# Patient Record
Sex: Female | Born: 1945
Health system: Southern US, Community
[De-identification: ages and names within clinical notes are randomized; demographics above are authoritative.]

## PROBLEM LIST (undated history)

## (undated) DIAGNOSIS — E041 Nontoxic single thyroid nodule: Secondary | ICD-10-CM

## (undated) DIAGNOSIS — F419 Anxiety disorder, unspecified: Secondary | ICD-10-CM

## (undated) DIAGNOSIS — M1990 Unspecified osteoarthritis, unspecified site: Secondary | ICD-10-CM

## (undated) DIAGNOSIS — I10 Essential (primary) hypertension: Secondary | ICD-10-CM

## (undated) DIAGNOSIS — R112 Nausea with vomiting, unspecified: Secondary | ICD-10-CM

## (undated) DIAGNOSIS — F32A Depression, unspecified: Secondary | ICD-10-CM

## (undated) DIAGNOSIS — J189 Pneumonia, unspecified organism: Secondary | ICD-10-CM

## (undated) DIAGNOSIS — Z87442 Personal history of urinary calculi: Secondary | ICD-10-CM

## (undated) DIAGNOSIS — R32 Unspecified urinary incontinence: Secondary | ICD-10-CM

## (undated) DIAGNOSIS — Z9889 Other specified postprocedural states: Secondary | ICD-10-CM

## (undated) DIAGNOSIS — R058 Other specified cough: Secondary | ICD-10-CM

## (undated) DIAGNOSIS — IMO0002 Reserved for concepts with insufficient information to code with codable children: Secondary | ICD-10-CM

## (undated) DIAGNOSIS — D649 Anemia, unspecified: Secondary | ICD-10-CM

## (undated) DIAGNOSIS — E039 Hypothyroidism, unspecified: Secondary | ICD-10-CM

## (undated) DIAGNOSIS — D45 Polycythemia vera: Secondary | ICD-10-CM

## (undated) DIAGNOSIS — Z8781 Personal history of (healed) traumatic fracture: Secondary | ICD-10-CM

## (undated) DIAGNOSIS — R011 Cardiac murmur, unspecified: Secondary | ICD-10-CM

## (undated) DIAGNOSIS — S83512A Sprain of anterior cruciate ligament of left knee, initial encounter: Secondary | ICD-10-CM

## (undated) DIAGNOSIS — N809 Endometriosis, unspecified: Secondary | ICD-10-CM

## (undated) DIAGNOSIS — K219 Gastro-esophageal reflux disease without esophagitis: Secondary | ICD-10-CM

## (undated) DIAGNOSIS — T464X5A Adverse effect of angiotensin-converting-enzyme inhibitors, initial encounter: Secondary | ICD-10-CM

## (undated) DIAGNOSIS — M8580 Other specified disorders of bone density and structure, unspecified site: Secondary | ICD-10-CM

## (undated) HISTORY — DX: Other specified cough: R05.8

## (undated) HISTORY — DX: Hypothyroidism, unspecified: E03.9

## (undated) HISTORY — DX: Endometriosis, unspecified: N80.9

## (undated) HISTORY — DX: Other specified disorders of bone density and structure, unspecified site: M85.80

## (undated) HISTORY — DX: Personal history of (healed) traumatic fracture: Z87.81

## (undated) HISTORY — DX: Sprain of anterior cruciate ligament of left knee, initial encounter: S83.512A

## (undated) HISTORY — DX: Reserved for concepts with insufficient information to code with codable children: IMO0002

## (undated) HISTORY — DX: Polycythemia vera: D45

## (undated) HISTORY — PX: TONSILLECTOMY: SUR1361

## (undated) HISTORY — DX: Unspecified urinary incontinence: R32

## (undated) HISTORY — DX: Adverse effect of angiotensin-converting-enzyme inhibitors, initial encounter: T46.4X5A

## (undated) HISTORY — DX: Cardiac murmur, unspecified: R01.1

## (undated) HISTORY — DX: Nontoxic single thyroid nodule: E04.1

---

## 1975-07-31 HISTORY — PX: AUGMENTATION MAMMAPLASTY: SUR837

## 1980-07-30 DIAGNOSIS — N809 Endometriosis, unspecified: Secondary | ICD-10-CM

## 1980-07-30 HISTORY — DX: Endometriosis, unspecified: N80.9

## 1980-07-30 HISTORY — PX: OTHER SURGICAL HISTORY: SHX169

## 1980-07-30 HISTORY — PX: ABDOMINAL HYSTERECTOMY: SHX81

## 1999-11-08 ENCOUNTER — Encounter: Admission: RE | Admit: 1999-11-08 | Discharge: 1999-11-08 | Payer: Self-pay | Admitting: Obstetrics and Gynecology

## 1999-11-08 ENCOUNTER — Encounter: Payer: Self-pay | Admitting: Obstetrics and Gynecology

## 2001-01-09 ENCOUNTER — Encounter: Admission: RE | Admit: 2001-01-09 | Discharge: 2001-01-09 | Payer: Self-pay | Admitting: Obstetrics and Gynecology

## 2001-01-09 ENCOUNTER — Encounter: Payer: Self-pay | Admitting: Obstetrics and Gynecology

## 2002-02-02 ENCOUNTER — Other Ambulatory Visit: Admission: RE | Admit: 2002-02-02 | Discharge: 2002-02-02 | Payer: Self-pay | Admitting: Obstetrics and Gynecology

## 2002-10-27 ENCOUNTER — Ambulatory Visit (HOSPITAL_COMMUNITY): Admission: RE | Admit: 2002-10-27 | Discharge: 2002-10-27 | Payer: Self-pay | Admitting: *Deleted

## 2002-10-27 ENCOUNTER — Encounter (INDEPENDENT_AMBULATORY_CARE_PROVIDER_SITE_OTHER): Payer: Self-pay | Admitting: *Deleted

## 2002-12-16 ENCOUNTER — Encounter: Admission: RE | Admit: 2002-12-16 | Discharge: 2002-12-16 | Payer: Self-pay | Admitting: Obstetrics and Gynecology

## 2002-12-16 ENCOUNTER — Encounter: Payer: Self-pay | Admitting: Obstetrics and Gynecology

## 2003-04-27 ENCOUNTER — Other Ambulatory Visit: Admission: RE | Admit: 2003-04-27 | Discharge: 2003-04-27 | Payer: Self-pay | Admitting: Obstetrics and Gynecology

## 2003-06-08 ENCOUNTER — Encounter: Admission: RE | Admit: 2003-06-08 | Discharge: 2003-06-08 | Payer: Self-pay | Admitting: Obstetrics and Gynecology

## 2004-12-04 ENCOUNTER — Ambulatory Visit (HOSPITAL_COMMUNITY): Admission: RE | Admit: 2004-12-04 | Discharge: 2004-12-04 | Payer: Self-pay | Admitting: Otolaryngology

## 2006-11-27 ENCOUNTER — Encounter: Admission: RE | Admit: 2006-11-27 | Discharge: 2006-11-27 | Payer: Self-pay | Admitting: Obstetrics & Gynecology

## 2006-12-27 ENCOUNTER — Other Ambulatory Visit: Admission: RE | Admit: 2006-12-27 | Discharge: 2006-12-27 | Payer: Self-pay | Admitting: Obstetrics & Gynecology

## 2007-07-31 DIAGNOSIS — M8580 Other specified disorders of bone density and structure, unspecified site: Secondary | ICD-10-CM

## 2007-07-31 HISTORY — DX: Other specified disorders of bone density and structure, unspecified site: M85.80

## 2008-01-29 ENCOUNTER — Other Ambulatory Visit: Admission: RE | Admit: 2008-01-29 | Discharge: 2008-01-29 | Payer: Self-pay | Admitting: Obstetrics & Gynecology

## 2008-03-11 ENCOUNTER — Encounter: Admission: RE | Admit: 2008-03-11 | Discharge: 2008-03-11 | Payer: Self-pay | Admitting: Family Medicine

## 2010-08-20 ENCOUNTER — Encounter: Payer: Self-pay | Admitting: Otolaryngology

## 2010-08-31 ENCOUNTER — Other Ambulatory Visit: Payer: Self-pay | Admitting: Obstetrics & Gynecology

## 2010-08-31 DIAGNOSIS — Z1239 Encounter for other screening for malignant neoplasm of breast: Secondary | ICD-10-CM

## 2010-09-14 ENCOUNTER — Ambulatory Visit
Admission: RE | Admit: 2010-09-14 | Discharge: 2010-09-14 | Disposition: A | Discharge status: Home / Self Care | Payer: BC Managed Care – PPO | Source: Ambulatory Visit | Attending: Obstetrics & Gynecology | Admitting: Obstetrics & Gynecology

## 2010-09-14 DIAGNOSIS — Z1239 Encounter for other screening for malignant neoplasm of breast: Secondary | ICD-10-CM

## 2010-12-15 NOTE — Op Note (Signed)
   NAME:  Heather Brown, Heather Brown                        ACCOUNT NO.:  [id number]   MEDICAL RECORD NO.:  [id number]                   PATIENT TYPE:  AMB   LOCATION:  ENDO                                 FACILITY:  [facility]   PHYSICIAN:  Georgiana Spinner, M.D.                 DATE OF BIRTH:  11/02/1945   DATE OF PROCEDURE:  DATE OF DISCHARGE:                                 OPERATIVE REPORT   PROCEDURE:  Colonoscopy.   INDICATIONS FOR PROCEDURE:  Hemoccult positivity.   ANESTHESIA:  Demerol 20 mg, Versed 3 mg .   PROCEDURE:  With the patient in mildly sedated in the left lateral decubitus  position, the Olympus videoscopic colonoscope was inserted in the rectum and  passed under direct vision to the cecum which was identified by the  ileocecal valve and appendiceal orifice, both of which were photographed.  From this point, the colonoscope was slowly withdrawn, taking  circumferential views of the entire colonic mucosa, stopping only in the  rectum which appeared normal and showed internal hemorrhoids on retroflex  view.  The endoscope was straightened and withdrawn.  The patient's vital  signs remained stable.  The patient tolerated the procedure well without  apparent complications.   FINDINGS:  Internal hemorrhoids, otherwise unremarkable endoscopic  examination to the cecum.   PLAN:  See endoscopy note for further details.  Will have the patient follow  up with me as an outpatient.                                               Georgiana Spinner, M.D.    GMO/MEDQ  D:  10/27/2002  T:  10/27/2002  Job:  [id number]   cc:   Al Decant. Janey Greaser, M.D.  [address]  [place]  [place] [address]  Fax: [phone]   Laqueta Linden, M.D.  [address]  [place]  [place] [address]  Fax: [phone]

## 2010-12-15 NOTE — Op Note (Signed)
   NAME:  Heather Brown, Heather Brown                        ACCOUNT NO.:  [id number]   MEDICAL RECORD NO.:  [id number]                   PATIENT TYPE:  AMB   LOCATION:  ENDO                                 FACILITY:  [facility]   PHYSICIAN:  Georgiana Spinner, M.D.                 DATE OF BIRTH:  05-21-1946   DATE OF PROCEDURE:  10/27/2002  DATE OF DISCHARGE:                                 OPERATIVE REPORT   PROCEDURE PERFORMED:  Upper endoscopy.   ENDOSCOPIST:  Georgiana Spinner, M.D.   INDICATIONS FOR PROCEDURE:  Hemoccult positivity.   ANESTHESIA:  Demerol 20 mg, Versed 3 mg, Phenergan 25 mg was given prior to  the procedure.   DESCRIPTION OF PROCEDURE:  With the patient mildly sedated in the left  lateral decubitus position, the Olympus video endoscope was inserted in the  mouth and passed under direct vision through the esophagus which appeared  normal.  There was a question of one small lick of Barrett's esophagus  tissue that was photographed and biopsied.  We entered into the stomach.  The fundus, body, antrum, duodenal bulb and second portion of the duodenum  all appeared normal.  From this point, the endoscope was slowly withdrawn  taking circumferential views of the entire duodenal mucosa until the  endoscope was pulled back into the stomach and placed on retroflexion to  view the stomach from below.  The endoscope was then straightened and  withdrawn taking circumferential views of the remaining gastric and  esophageal mucosa.  The patient's vital signs and pulse oximeter remained  stable.  The patient tolerated the procedure well without apparent  complications.    FINDINGS:  Question of Barrett's esophagus, biopsied.  Await biopsy report.  Patient will call me for results and follow up with me as an outpatient.  Proceed to colonoscopy as planned.   PLAN:                                               Georgiana Spinner, M.D.    GMO/MEDQ  D:  10/27/2002  T:  10/27/2002  Job:   [id number]   cc:   Al Decant. Janey Greaser, M.D.  [address]  [place]  [place] [address]  Fax: [phone]   Laqueta Linden, M.D.  [address]  [place]  [place] [address]  Fax: [phone]

## 2011-10-11 ENCOUNTER — Other Ambulatory Visit: Payer: Self-pay | Admitting: Obstetrics & Gynecology

## 2011-10-11 DIAGNOSIS — Z1231 Encounter for screening mammogram for malignant neoplasm of breast: Secondary | ICD-10-CM

## 2011-11-12 ENCOUNTER — Ambulatory Visit
Admission: RE | Admit: 2011-11-12 | Discharge: 2011-11-12 | Disposition: A | Discharge status: Home / Self Care | Payer: BC Managed Care – PPO | Source: Ambulatory Visit | Attending: Obstetrics & Gynecology | Admitting: Obstetrics & Gynecology

## 2011-11-12 DIAGNOSIS — Z1231 Encounter for screening mammogram for malignant neoplasm of breast: Secondary | ICD-10-CM

## 2011-11-12 LAB — HM MAMMOGRAPHY

## 2011-11-28 HISTORY — PX: COSMETIC SURGERY: SHX468

## 2012-01-07 LAB — HM PAP SMEAR

## 2013-02-12 ENCOUNTER — Ambulatory Visit: Payer: Self-pay | Admitting: Obstetrics and Gynecology

## 2013-02-12 ENCOUNTER — Ambulatory Visit: Payer: Self-pay | Admitting: Obstetrics & Gynecology

## 2013-02-13 ENCOUNTER — Encounter (INDEPENDENT_AMBULATORY_CARE_PROVIDER_SITE_OTHER): Payer: Medicare Other | Admitting: Vascular Surgery

## 2013-02-13 DIAGNOSIS — I70229 Atherosclerosis of native arteries of extremities with rest pain, unspecified extremity: Secondary | ICD-10-CM

## 2013-03-11 ENCOUNTER — Ambulatory Visit (INDEPENDENT_AMBULATORY_CARE_PROVIDER_SITE_OTHER): Payer: MEDICARE | Admitting: Obstetrics and Gynecology

## 2013-03-11 ENCOUNTER — Encounter: Payer: Self-pay | Admitting: Obstetrics and Gynecology

## 2013-03-11 VITALS — BP 110/78 | HR 70 | Ht 62.0 in | Wt 166.5 lb

## 2013-03-11 DIAGNOSIS — M8580 Other specified disorders of bone density and structure, unspecified site: Secondary | ICD-10-CM

## 2013-03-11 DIAGNOSIS — Z0000 Encounter for general adult medical examination without abnormal findings: Secondary | ICD-10-CM

## 2013-03-11 DIAGNOSIS — M949 Disorder of cartilage, unspecified: Secondary | ICD-10-CM

## 2013-03-11 DIAGNOSIS — R5381 Other malaise: Secondary | ICD-10-CM

## 2013-03-11 DIAGNOSIS — Z23 Encounter for immunization: Secondary | ICD-10-CM

## 2013-03-11 DIAGNOSIS — N952 Postmenopausal atrophic vaginitis: Secondary | ICD-10-CM

## 2013-03-11 DIAGNOSIS — Z1231 Encounter for screening mammogram for malignant neoplasm of breast: Secondary | ICD-10-CM

## 2013-03-11 DIAGNOSIS — R5383 Other fatigue: Secondary | ICD-10-CM

## 2013-03-11 DIAGNOSIS — M899 Disorder of bone, unspecified: Secondary | ICD-10-CM

## 2013-03-11 DIAGNOSIS — Z01419 Encounter for gynecological examination (general) (routine) without abnormal findings: Secondary | ICD-10-CM

## 2013-03-11 LAB — COMPREHENSIVE METABOLIC PANEL
AST: 25 U/L (ref 0–37)
Albumin: 4.6 g/dL (ref 3.5–5.2)
Alkaline Phosphatase: 115 U/L (ref 39–117)
Calcium: 10.3 mg/dL (ref 8.4–10.5)
Chloride: 105 mEq/L (ref 96–112)
Creat: 0.74 mg/dL (ref 0.50–1.10)
Sodium: 140 mEq/L (ref 135–145)
Total Bilirubin: 1 mg/dL (ref 0.3–1.2)

## 2013-03-11 LAB — THYROID PANEL WITH TSH
Free Thyroxine Index: 3.2 (ref 1.0–3.9)
T3 Uptake: 33.8 % (ref 22.5–37.0)
T4, Total: 9.6 ug/dL (ref 5.0–12.5)

## 2013-03-11 LAB — CBC
HCT: 46 % (ref 36.0–46.0)
MCHC: 33.9 g/dL (ref 30.0–36.0)
Platelets: 265 10*3/uL (ref 150–400)
RBC: 5.42 MIL/uL — ABNORMAL HIGH (ref 3.87–5.11)

## 2013-03-11 LAB — POCT URINALYSIS DIPSTICK
Bilirubin, UA: NEGATIVE
Blood, UA: NEGATIVE
Ketones, UA: NEGATIVE
Leukocytes, UA: NEGATIVE

## 2013-03-11 LAB — LIPID PANEL
Cholesterol: 182 mg/dL (ref 0–200)
LDL Cholesterol: 115 mg/dL — ABNORMAL HIGH (ref 0–99)
Total CHOL/HDL Ratio: 3.6 Ratio

## 2013-03-11 NOTE — Progress Notes (Signed)
Patient ID: Heather Brown, female   DOB: 1945-10-18, 67 y.o.   MRN: [id number] 67 y.o.   Married    Caucasian   female   G2P2002   here for annual exam.    Patient having problems with peripheral neuropathy.   Has had steroid injections Had bilateral leg biopsies yesterday.  Had a HgbA1C 5.6%.  Vitamin B12 - 519. - both at [facility].  Told her level of B12 was low and that she needed to do supplementation, which is expensive.  Patient questioning this recommendation and asking for a second opinion.   Patient would like to have general blood work Concerned about thyroid disease, which runs in the family.   Has a history of vitamin D deficiency.  External vulvar irritation.  No discharge. Dryness with intercourse. [place] Jelly is not sufficient.   Some urinary incontinence but no pad use.    No LMP recorded. Patient has had a hysterectomy.  Had abnormal paps and endometriosis.   Still has ovaries.  Sexually active: yes  The current method of family planning is status post hysterectomy.    Exercising: no Last mammogram:  10/2011 wnl:[facility] Last pap smear: 01-07-12 wnl History of abnormal pap: 30 years ago and this is why had hysterectomy Smoking: no Alcohol: no Last colonoscopy: 2004 wnl Last Bone Density:   2009:osteopenia.  T Score of lumbar spine: - 1.5, T Score of left femur:  - 1.8.   Last cholesterol check: 2013 wnl with PCP Dr. Juluis Rainier  Hgb:                Urine: Neg   Family History  Problem Relation Age of Onset  . Cancer Mother   . Cervical cancer Mother   . Diabetes Father     adult onset  . Stroke Father   . Thyroid disease Sister     hyperthyroid  . Asthma Sister   . Cancer Maternal Grandmother   . Stomach cancer Maternal Grandmother   . Thyroid disease Sister     hypothyroid    There are no active problems to display for this patient.   Past Medical History  Diagnosis Date  . Osteopenia 2009  . Endometriosis 1982    found  at [facility]  . Dyspareunia     dryness with intercourse  . Heart murmur   . Urinary incontinence     with coughing/sneezing    Past Surgical History  Procedure Laterality Date  . Dilatation and curettage  1982  . Abdominal hysterectomy  1982    [facility]--for abnormal paps/endometriosis  . Augmentation mammaplasty  1977  . Cosmetic surgery  11/2011    face lift,neck lift, eye lift--Dr. Benna Dunks    Allergies: Sulfa antibiotics  Current Outpatient Prescriptions  Medication Sig Dispense Refill  . aspirin 81 MG tablet Take 81 mg by mouth daily.      . fish oil-omega-3 fatty acids 1000 MG capsule Take 2 g by mouth daily.      . Multiple Vitamin (MULTIVITAMIN) capsule Take 1 capsule by mouth daily.      . calcium carbonate (OS-CAL) 600 MG TABS tablet Take 600 mg by mouth 2 (two) times daily with a meal.      . Vitamin D, Ergocalciferol, (DRISDOL) 50000 UNITS CAPS capsule Take 50,000 Units by mouth every 14 (fourteen) days.       No current facility-administered medications for this visit.    ROS: Pertinent items are noted in  HPI.  Social Hx:  Married.   Exam:    BP 110/78  Pulse 70  Ht 5\' 2"  (1.575 m)  Wt 166 lb 8 oz (75.524 kg)  BMI 30.45 kg/m2   Wt Readings from Last 3 Encounters:  03/11/13 166 lb 8 oz (75.524 kg)     Ht Readings from Last 3 Encounters:  03/11/13 5\' 2"  (1.575 m)    General appearance: alert, cooperative and appears stated age Head: Normocephalic, without obvious abnormality, atraumatic Neck: no adenopathy, supple, symmetrical, trachea midline and thyroid not enlarged, symmetric, no tenderness/mass/nodules Lungs: clear to auscultation bilaterally Breasts: Inspection negative, No nipple retraction or dimpling, No nipple discharge or bleeding, No axillary or supraclavicular adenopathy, Normal to palpation without dominant masses Heart: regular rate and rhythm Abdomen: soft, non-tender; bowel sounds normal; no masses,  no organomegaly Extremities: extremities  normal, atraumatic, no cyanosis or edema Skin: Skin color, texture, turgor normal. No rashes or lesions Lymph nodes: Cervical, supraclavicular, and axillary nodes normal. No abnormal inguinal nodes palpated Neurologic: Grossly normal   Pelvic: External genitalia:  no lesions              Urethra:  normal appearing urethra with no masses, tenderness or lesions              Bartholins and Skenes: normal                 Vagina: normal appearing vagina with normal color and discharge, no lesions              Cervix:  Absent.              Pap taken: yes and high risk HPV testing.         Bimanual Exam:  Uterus:   absent                                      Adnexa: normal adnexa in size, nontender and no masses                                      Rectovaginal: Confirms                                      Anus:  normal sphincter tone, no lesions  Assessment  Atrophic vaginitis.  Overdue for mammogram. History of cervical dysplasia.  Status post hysterectomy over 20 years ago. Ovaries remain.  Osteopenia.  Fatigue/malaise. Suspected peripheral neuropathy.  Under evaluation.  Overdue for tetanus vaccine.   P:     M[facility]ogram at [facility].  Order placed. Patient will call. Bone density at [facility].  Order placed. Patient will call.  pap smear and HPV testing.  Labs - lipid profile, CBC, TFTs, CMP, Vitamin D, Vitamin B12 level. TDap. I did discuss possible estrogen therapy for atrophy symptoms.  Patient will need to get her mammogram done first.  I suggested the patient wait for her biopsy results for the peripheral neuropathy work up and then discuss the treatment options available. On her lab review, her Vitamin B 12 was within a normal range, but I indicated that her physician may have research showing what an appropriate level should be given her diagnosis.  Return annually or prn.  An After Visit Summary was printed and given to the patient.

## 2013-03-11 NOTE — Patient Instructions (Addendum)
Cyanocobalamin, Vitamin B12 injection What is this medicine? CYANOCOBALAMIN (sye an oh koe BAL a min) is a man made form of vitamin B12. Vitamin B12 is used in the growth of healthy blood cells, nerve cells, and proteins in the body. It also helps with the metabolism of fats and carbohydrates. This medicine is used to treat people who can not absorb vitamin B12. This medicine may be used for other purposes; ask your health care provider or pharmacist if you have questions. What should I tell my health care provider before I take this medicine? They need to know if you have any of these conditions: -kidney disease -Leber's disease -megaloblastic anemia -an unusual or allergic reaction to cyanocobalamin, cobalt, other medicines, foods, dyes, or preservatives -pregnant or trying to get pregnant -breast-feeding How should I use this medicine? This medicine is injected into a muscle or deeply under the skin. It is usually given by a health care professional in a clinic or doctor's office. However, your doctor may teach you how to inject yourself. Follow all instructions. Talk to your pediatrician regarding the use of this medicine in children. Special care may be needed. Overdosage: If you think you have taken too much of this medicine contact a poison control center or emergency room at once. NOTE: This medicine is only for you. Do not share this medicine with others. What if I miss a dose? If you are given your dose at a clinic or doctor's office, call to reschedule your appointment. If you give your own injections and you miss a dose, take it as soon as you can. If it is almost time for your next dose, take only that dose. Do not take double or extra doses. What may interact with this medicine? -colchicine -heavy alcohol intake This list may not describe all possible interactions. Give your health care provider a list of all the medicines, herbs, non-prescription drugs, or dietary supplements you  use. Also tell them if you smoke, drink alcohol, or use illegal drugs. Some items may interact with your medicine. What should I watch for while using this medicine? Visit your doctor or health care professional regularly. You may need blood work done while you are taking this medicine. You may need to follow a special diet. Talk to your doctor. Limit your alcohol intake and avoid smoking to get the best benefit. What side effects may I notice from receiving this medicine? Side effects that you should report to your doctor or health care professional as soon as possible: -allergic reactions like skin rash, itching or hives, swelling of the face, lips, or tongue -blue tint to skin -chest tightness, pain -difficulty breathing, wheezing -dizziness -red, swollen painful area on the leg Side effects that usually do not require medical attention (report to your doctor or health care professional if they continue or are bothersome): -diarrhea -headache This list may not describe all possible side effects. Call your doctor for medical advice about side effects. You may report side effects to FDA at 1-800-FDA-1088. Where should I keep my medicine? Keep out of the reach of children. Store at room temperature between 15 and 30 degrees C (59 and 85 degrees F). Protect from light. Throw away any unused medicine after the expiration date. NOTE: This sheet is a summary. It may not cover all possible information. If you have questions about this medicine, talk to your doctor, pharmacist, or health care provider.  2013, Elsevier/Gold Standard. (10/27/2007 10:10:20 PM)  EXERCISE AND DIET:  We recommended  that you start or continue a regular exercise program for good health. Regular exercise means any activity that makes your heart beat faster and makes you sweat.  We recommend exercising at least 30 minutes per day at least 3 days a week, preferably 4 or 5.  We also recommend a diet low in fat and sugar.   Inactivity, poor dietary choices and obesity can cause diabetes, heart attack, stroke, and kidney damage, among others.    ALCOHOL AND SMOKING:  Women should limit their alcohol intake to no more than 7 drinks/beers/glasses of wine (combined, not each!) per week. Moderation of alcohol intake to this level decreases your risk of breast cancer and liver damage. And of course, no recreational drugs are part of a healthy lifestyle.  And absolutely no smoking or even second hand smoke. Most people know smoking can cause heart and lung diseases, but did you know it also contributes to weakening of your bones? Aging of your skin?  Yellowing of your teeth and nails?  CALCIUM AND VITAMIN D:  Adequate intake of calcium and Vitamin D are recommended.  The recommendations for exact amounts of these supplements seem to change often, but generally speaking 600 mg of calcium (either carbonate or citrate) and 800 units of Vitamin D per day seems prudent. Certain women may benefit from higher intake of Vitamin D.  If you are among these women, your doctor will have told you during your visit.    PAP SMEARS:  Pap smears, to check for cervical cancer or precancers,  have traditionally been done yearly, although recent scientific advances have shown that most women can have pap smears less often.  However, every woman still should have a physical exam from her gynecologist every year. It will include a breast check, inspection of the vulva and vagina to check for abnormal growths or skin changes, a visual exam of the cervix, and then an exam to evaluate the size and shape of the uterus and ovaries.  And after 68 years of age, a rectal exam is indicated to check for rectal cancers. We will also provide age appropriate advice regarding health maintenance, like when you should have certain vaccines, screening for sexually transmitted diseases, bone density testing, colonoscopy, mammograms, etc.   MAMMOGRAMS:  All women over 37  years old should have a yearly mammogram. Many facilities now offer a "3D" mammogram, which may cost around $50 extra out of pocket. If possible,  we recommend you accept the option to have the 3D mammogram performed.  It both reduces the number of women who will be called back for extra views which then turn out to be normal, and it is better than the routine mammogram at detecting truly abnormal areas.    COLONOSCOPY:  Colonoscopy to screen for colon cancer is recommended for all women at age 93.  We know, you hate the idea of the prep.  We agree, BUT, having colon cancer and not knowing it is worse!!  Colon cancer so often starts as a polyp that can be seen and removed at colonscopy, which can quite literally save your life!  And if your first colonoscopy is normal and you have no family history of colon cancer, most women don't have to have it again for 10 years.  Once every ten years, you can do something that may end up saving your life, right?  We will be happy to help you get it scheduled when you are ready.  Be sure to check  your insurance coverage so you understand how much it will cost.  It may be covered as a preventative service at no cost, but you should check your particular policy.    Tetanus, Diphtheria, Pertussis (Tdap) Vaccine What You Need to Know WHY GET VACCINATED? Tetanus, diphtheria and pertussis can be very serious diseases, even for adolescents and adults. Tdap vaccine can protect [place] from these diseases. TETANUS (Lockjaw) causes painful muscle tightening and stiffness, usually all over the body.  It can lead to tightening of muscles in the head and neck so you can't open your mouth, swallow, or sometimes even breathe. Tetanus kills about 1 out of 5 people who are infected. DIPHTHERIA can cause a thick coating to form in the back of the throat.  It can lead to breathing problems, paralysis, heart failure, and death. PERTUSSIS (Whooping Cough) causes severe coughing spells,  which can cause difficulty breathing, vomiting and disturbed sleep.  It can also lead to weight loss, incontinence, and rib fractures. Up to 2 in 100 adolescents and 5 in 100 adults with pertussis are hospitalized or have complications, which could include pneumonia and death. These diseases are caused by bacteria. Diphtheria and pertussis are spread from person to person through coughing or sneezing. Tetanus enters the body through cuts, scratches, or wounds. Before vaccines, the [place] saw as many as 200,000 cases a year of diphtheria and pertussis, and hundreds of cases of tetanus. Since vaccination began, tetanus and diphtheria have dropped by about 99% and pertussis by about 80%. TDAP VACCINE Tdap vaccine can protect adolescents and adults from tetanus, diphtheria, and pertussis. One dose of Tdap is routinely given at age 72 or 39. People who did not get Tdap at that age should get it as soon as possible. Tdap is especially important for health care professionals and anyone having close contact with a baby younger than 12 months. Pregnant women should get a dose of Tdap during every pregnancy, to protect the newborn from pertussis. Infants are most at risk for severe, life-threatening complications from pertussis. A similar vaccine, called Td, protects from tetanus and diphtheria, but not pertussis. A Td booster should be given every 10 years. Tdap may be given as one of these boosters if you have not already gotten a dose. Tdap may also be given after a severe cut or burn to prevent tetanus infection. Your doctor can give you more information. Tdap may safely be given at the same time as other vaccines. SOME PEOPLE SHOULD NOT GET THIS VACCINE  If you ever had a life-threatening allergic reaction after a dose of any tetanus, diphtheria, or pertussis containing vaccine, OR if you have a severe allergy to any part of this vaccine, you should not get Tdap. Tell your doctor if you have any  severe allergies.  If you had a coma, or long or multiple seizures within 7 days after a childhood dose of DTP or DTaP, you should not get Tdap, unless a cause other than the vaccine was found. You can still get Td.  Talk to your doctor if you:  have epilepsy or another nervous system problem,  had severe pain or swelling after any vaccine containing diphtheria, tetanus or pertussis,  ever had Guillain-Barr Syndrome (GBS),  aren't feeling well on the day the shot is scheduled. RISKS OF A VACCINE REACTION With any medicine, including vaccines, there is a chance of side effects. These are usually mild and go away on their own, but serious reactions are also possible.  Brief fainting spells can follow a vaccination, leading to injuries from falling. Sitting or lying down for about 15 minutes can help prevent these. Tell your doctor if you feel dizzy or light-headed, or have vision changes or ringing in the ears. Mild problems following Tdap (Did not interfere with activities)  Pain where the shot was given (about 3 in 4 adolescents or 2 in 3 adults)  Redness or swelling where the shot was given (about 1 person in 5)  Mild fever of at least 100.20F (up to about 1 in 25 adolescents or 1 in 100 adults)  Headache (about 3 or 4 people in 10)  Tiredness (about 1 person in 3 or 4)  Nausea, vomiting, diarrhea, stomach ache (up to 1 in 4 adolescents or 1 in 10 adults)  Chills, body aches, sore joints, rash, swollen glands (uncommon) Moderate problems following Tdap (Interfered with activities, but did not require medical attention)  Pain where the shot was given (about 1 in 5 adolescents or 1 in 100 adults)  Redness or swelling where the shot was given (up to about 1 in 16 adolescents or 1 in 25 adults)  Fever over 102F (about 1 in 100 adolescents or 1 in 250 adults)  Headache (about 3 in 20 adolescents or 1 in 10 adults)  Nausea, vomiting, diarrhea, stomach ache (up to 1 or 3 people in  100)  Swelling of the entire arm where the shot was given (up to about 3 in 100). Severe problems following Tdap (Unable to perform usual activities, required medical attention)  Swelling, severe pain, bleeding and redness in the arm where the shot was given (rare). A severe allergic reaction could occur after any vaccine (estimated less than 1 in a million doses). WHAT IF THERE IS A SERIOUS REACTION? What should I look for?  Look for anything that concerns you, such as signs of a severe allergic reaction, very high fever, or behavior changes. Signs of a severe allergic reaction can include hives, swelling of the face and throat, difficulty breathing, a fast heartbeat, dizziness, and weakness. These would start a few minutes to a few hours after the vaccination. What should I do?  If you think it is a severe allergic reaction or other emergency that can't wait, call 9-1-1 or get the person to the nearest hospital. Otherwise, call your doctor.  Afterward, the reaction should be reported to the "Vaccine Adverse Event Reporting System" (VAERS). Your doctor might file this report, or you can do it yourself through the VAERS web site at www.vaers.LAgents.no, or by calling 1-(416) 555-5564. VAERS is only for reporting reactions. They do not give medical advice.  THE NATIONAL VACCINE INJURY COMPENSATION PROGRAM The National Vaccine Injury Compensation Program (VICP) is a federal program that was created to compensate people who may have been injured by certain vaccines. Persons who believe they may have been injured by a vaccine can learn about the program and about filing a claim by calling 1-(609) 295-1142 or visiting the VICP website at SpiritualWord.at. HOW CAN I LEARN MORE?  Ask your doctor.  Call your local or state health department.  Contact the Centers for Disease Control and Prevention (CDC):  Call 931-827-8795 or visit CDC's website at PicCapture.uy. CDC Tdap  Vaccine VIS (12/06/11) Document Released: 01/15/2012 Document Revised: 04/09/2012 Document Reviewed: 01/15/2012 Exit[employer] Patient Information 2014 [place], [place].

## 2013-03-12 LAB — VITAMIN D 25 HYDROXY (VIT D DEFICIENCY, FRACTURES): Vit D, 25-Hydroxy: 29 ng/mL — ABNORMAL LOW (ref 30–89)

## 2013-03-13 ENCOUNTER — Telehealth: Payer: Self-pay | Admitting: Obstetrics & Gynecology

## 2013-03-31 ENCOUNTER — Other Ambulatory Visit: Payer: Medicare Other

## 2013-04-01 ENCOUNTER — Ambulatory Visit
Admission: RE | Admit: 2013-04-01 | Discharge: 2013-04-01 | Disposition: A | Discharge status: Home / Self Care | Payer: Medicare Other | Source: Ambulatory Visit | Attending: Obstetrics and Gynecology | Admitting: Obstetrics and Gynecology

## 2013-04-01 DIAGNOSIS — M8580 Other specified disorders of bone density and structure, unspecified site: Secondary | ICD-10-CM

## 2013-04-01 DIAGNOSIS — Z1231 Encounter for screening mammogram for malignant neoplasm of breast: Secondary | ICD-10-CM

## 2013-04-29 ENCOUNTER — Other Ambulatory Visit: Payer: Self-pay | Admitting: Gastroenterology

## 2013-10-07 ENCOUNTER — Encounter (HOSPITAL_COMMUNITY): Payer: Self-pay | Admitting: Pharmacy Technician

## 2013-10-08 ENCOUNTER — Encounter (HOSPITAL_COMMUNITY): Payer: Self-pay | Admitting: *Deleted

## 2013-10-23 ENCOUNTER — Other Ambulatory Visit: Payer: Self-pay | Admitting: Gastroenterology

## 2013-10-23 NOTE — Addendum Note (Signed)
Addended by: Jacki Couse on: 10/23/2013 03:18 PM   Modules accepted: Orders  

## 2013-10-28 NOTE — Anesthesia Preprocedure Evaluation (Signed)
Anesthesia Evaluation  Patient identified by MRN, date of birth, ID band Patient awake    Reviewed: Allergy & Precautions, H&P , NPO status , Patient's Chart, lab work & pertinent test results  Airway Mallampati: II TM Distance: >3 FB Neck ROM: Full    Dental  (+) Dental Advisory Given   Pulmonary neg pulmonary ROS,  breath sounds clear to auscultation        Cardiovascular negative cardio ROS  + Valvular Problems/Murmurs Rhythm:Regular Rate:Normal     Neuro/Psych negative neurological ROS  negative psych ROS   GI/Hepatic negative GI ROS, Neg liver ROS,   Endo/Other  negative endocrine ROS  Renal/GU negative Renal ROS     Musculoskeletal negative musculoskeletal ROS (+)   Abdominal   Peds  Hematology negative hematology ROS (+)   Anesthesia Other Findings   Reproductive/Obstetrics negative OB ROS                           Anesthesia Physical Anesthesia Plan  ASA: II  Anesthesia Plan: MAC   Post-op Pain Management:    Induction: Intravenous  Airway Management Planned: Natural Airway and Simple Face Mask  Additional Equipment:   Intra-op Plan:   Post-operative Plan:   Informed Consent: I have reviewed the patients History and Physical, chart, labs and discussed the procedure including the risks, benefits and alternatives for the proposed anesthesia with the patient or authorized representative who has indicated his/her understanding and acceptance.   Dental advisory given  Plan Discussed with: CRNA  Anesthesia Plan Comments:         Anesthesia Quick Evaluation

## 2013-10-29 ENCOUNTER — Ambulatory Visit (HOSPITAL_COMMUNITY)
Admission: RE | Admit: 2013-10-29 | Discharge: 2013-10-29 | Disposition: A | Discharge status: Home / Self Care | Payer: Medicare Other | Source: Ambulatory Visit | Attending: Gastroenterology | Admitting: Gastroenterology

## 2013-10-29 ENCOUNTER — Ambulatory Visit (HOSPITAL_COMMUNITY): Payer: Medicare Other | Admitting: Anesthesiology

## 2013-10-29 ENCOUNTER — Encounter (HOSPITAL_COMMUNITY): Payer: Medicare Other | Admitting: Anesthesiology

## 2013-10-29 ENCOUNTER — Encounter (HOSPITAL_COMMUNITY)
Admission: RE | Disposition: A | Discharge status: Home / Self Care | Payer: Self-pay | Source: Ambulatory Visit | Attending: Gastroenterology

## 2013-10-29 ENCOUNTER — Encounter (HOSPITAL_COMMUNITY): Payer: Self-pay

## 2013-10-29 DIAGNOSIS — D126 Benign neoplasm of colon, unspecified: Secondary | ICD-10-CM | POA: Insufficient documentation

## 2013-10-29 DIAGNOSIS — R1032 Left lower quadrant pain: Secondary | ICD-10-CM | POA: Insufficient documentation

## 2013-10-29 DIAGNOSIS — R011 Cardiac murmur, unspecified: Secondary | ICD-10-CM | POA: Insufficient documentation

## 2013-10-29 HISTORY — PX: COLONOSCOPY WITH PROPOFOL: SHX5780

## 2013-10-29 SURGERY — COLONOSCOPY WITH PROPOFOL
Anesthesia: Monitor Anesthesia Care

## 2013-10-29 MED ORDER — LACTATED RINGERS IV SOLN
INTRAVENOUS | Status: DC
  Administered 2013-10-29: 1000 mL via INTRAVENOUS

## 2013-10-29 MED ORDER — LIDOCAINE HCL (PF) 2 % IJ SOLN
INTRAMUSCULAR | Status: DC | PRN
Start: 2013-10-29 — End: 2013-10-29
  Administered 2013-10-29: 50 mg via INTRADERMAL

## 2013-10-29 MED ORDER — PROPOFOL INFUSION 10 MG/ML OPTIME
INTRAVENOUS | Status: DC | PRN
  Administered 2013-10-29: 120 ug/kg/min via INTRAVENOUS

## 2013-10-29 MED ORDER — KETAMINE HCL 10 MG/ML IJ SOLN
INTRAMUSCULAR | Status: DC | PRN
  Administered 2013-10-29 (×2): 10 mg via INTRAVENOUS
  Administered 2013-10-29: 20 mg via INTRAVENOUS

## 2013-10-29 MED ORDER — PROPOFOL 10 MG/ML IV BOLUS
INTRAVENOUS | Status: AC
  Filled 2013-10-29: qty 20

## 2013-10-29 MED ORDER — MIDAZOLAM HCL 2 MG/2ML IJ SOLN
INTRAMUSCULAR | Status: AC
  Filled 2013-10-29: qty 2

## 2013-10-29 MED ORDER — SODIUM CHLORIDE 0.9 % IV SOLN: INTRAVENOUS | Status: DC

## 2013-10-29 MED ORDER — MIDAZOLAM HCL 5 MG/5ML IJ SOLN
INTRAMUSCULAR | Status: DC | PRN
  Administered 2013-10-29 (×2): 1 mg via INTRAVENOUS

## 2013-10-29 MED ORDER — LIDOCAINE HCL (CARDIAC) 20 MG/ML IV SOLN
INTRAVENOUS | Status: AC
  Filled 2013-10-29: qty 5

## 2013-10-29 MED ORDER — KETAMINE HCL 10 MG/ML IJ SOLN
INTRAMUSCULAR | Status: AC
  Filled 2013-10-29: qty 1

## 2013-10-29 SURGICAL SUPPLY — 21 items

## 2013-10-29 NOTE — Progress Notes (Signed)
Heather Brown 10:04 AM  Subjective: Patient with some vague left lower quadrant pains that come and go some are eating related some are movement related all are short lived and no other new complaints or medical problem since I have seen her  Objective: Vital signs stable afebrile no acute distress physical exam please see pre-assessment evaluation  Assessment: Difficult to remove colon polyp  Plan: Okay to proceed with colonoscopy and anesthesia assistance  [facility] E

## 2013-10-29 NOTE — Transfer of Care (Signed)
Immediate Anesthesia Transfer of Care Note  Patient: Heather Brown  Procedure(s) Performed: Procedure(s) (LRB): COLONOSCOPY WITH PROPOFOL (N/A)  Patient Location: PACU  Anesthesia Type: MAC  Level of Consciousness: sedated, patient cooperative and responds to stimulation  Airway & Oxygen Therapy: Patient Spontanous Breathing and Patient connected to face mask oxgen  Post-op Assessment: Report given to PACU RN and Post -op Vital signs reviewed and stable  Post vital signs: Reviewed and stable  Complications: No apparent anesthesia complications

## 2013-10-29 NOTE — Anesthesia Postprocedure Evaluation (Signed)
Anesthesia Post Note  Patient: Heather Brown  Procedure(s) Performed: Procedure(s) (LRB): COLONOSCOPY WITH PROPOFOL (N/A)  Anesthesia type: MAC  Patient location: PACU  Post pain: Pain level controlled  Post assessment: Post-op Vital signs reviewed  Last Vitals: BP 141/79  Pulse 85  Temp(Src) 36.5 C (Oral)  Resp 13  Ht 5\' 2"  (1.575 m)  Wt 164 lb (74.39 kg)  BMI 29.99 kg/m2  SpO2 96%  Post vital signs: Reviewed  Level of consciousness: awake  Complications: No apparent anesthesia complications

## 2013-10-29 NOTE — Op Note (Signed)
[facility] [place], [address]   COLONOSCOPY PROCEDURE REPORT  PATIENT: Heather Brown, Heather Brown  MR#: [id number] BIRTHDATE: Apr 13, 1946 , 5  yrs. old GENDER: Female ENDOSCOPIST: Clarene Essex, MD REFERRED IR:SWNIOEVOJ Drema Dallas, M.D.  Arloa Koh, M.D. PROCEDURE DATE:  10/29/2013 PROCEDURE:   Colonoscopy with snare polypectomy  and clipping ASA CLASS:   Class I INDICATIONS:Patient's personal history of adenomatous colon polyps. difficult to remove sigmoid polyp MEDICATIONS: propofol (Diprivan) 300mg  IV and Versed 2 mg IV ketamine 40 mg  DESCRIPTION OF PROCEDURE:   After the risks benefits and alternatives of the procedure were thoroughly explained, informed consent was obtained.  The Pentax Ped Colon [id number]  endoscope was introduced through the anus and advanced to the terminal ileum which was intubated for a short distance , limited by No adverse events experienced.   The quality of the prep was adequate. .  The instrument was then slowly withdrawn as the colon was fully examined..the scope was easier  to advance to the cecum than on previous colonoscopy and did not require any position changes or abdominal pressure but she did have a tortuous sigmoid only and no abnormalities were seen on insertion and we did not see the [place] ink on insertion and the cecum was identified by the appendiceal orifice and the ileocecal valve in fact the scope was inserted a short ways into the terminal ileum which was normal and the scope was slowly withdrawn and the prep was adequate with some liquid stool easily washed and suctioned for adequate visualization. On slow withdrawal no abnormalities were seen until we withdrew to the proximal at approximately 30 cm where themedium-size sessile polyp was foundas well as the previously injected Niger ink and we went ahead and snared it in a piecemeal fashion using the hot snare however on the third snare we thought we had the  remaining part of the polyp there seemed to be a fairly deep crater after polypectomy and we elected to place 3 clips to close the defect which looked good afterward and no obvious residual polyp was seen and the scope was then slowly withdrawn back to the rectum and anal rectal pull-through and retroflexion was normal and no other abnormalities were seen and the scope was reinserted to the polypectomy site air was suctioned scope removed patient tolerated the procedure well I don't think we had an obvious polypectomy perforation             FINDINGS:  1 proximal sigmoid  medium-sized sessile polyp with Niger ink tattoo status post piecemeal snarex3 and 3 clips placed to close the defect at the end  2.otherwise within normal limits to the terminal ileum COMPLICATIONS: none obvious at the end of the procedure  IMPRESSION:  above  RECOMMENDATIONS: await pathology and will keep on clear liquids only today just in case and happy to see back when necessary otherwise we'll decide repeat colon screening based on pathology   _______________________________ eSigned:  Clarene Essex, MD 10/29/2013 11:02 AM   JK:[id number] Drema Dallas, MD Josefa Half, MD  PATIENT NAME:  Heather Brown, Heather Brown MR#: [id number]

## 2013-10-29 NOTE — Discharge Instructions (Signed)
Colonoscopy, Care After Refer to this sheet in the next few weeks. These instructions provide you with information on caring for yourself after your procedure. Your health care provider may also give you more specific instructions. Your treatment has been planned according to current medical practices, but problems sometimes occur. Call your health care provider if you have any problems or questions after your procedure. WHAT TO EXPECT AFTER THE PROCEDURE  After your procedure, it is typical to have the following:  A small amount of blood in your stool.  Moderate amounts of gas and mild abdominal cramping or bloating. HOME CARE INSTRUCTIONS  Do not drive, operate machinery, or sign important documents for 24 hours.  You may shower and resume your regular physical activities, but move at a slower pace for the first 24 hours.  Take frequent rest periods for the first 24 hours.  Walk around or put a warm pack on your abdomen to help reduce abdominal cramping and bloating.  Drink enough fluids to keep your urine clear or pale yellow.  You may resume your normal diet as instructed by your health care provider. Avoid heavy or fried foods that are hard to digest.  Avoid drinking alcohol for 24 hours or as instructed by your health care provider.  Only take over-the-counter or prescription medicines as directed by your health care provider.  If a tissue sample (biopsy) was taken during your procedure:  Do not take aspirin or blood thinners for 7 days, or as instructed by your health care provider.  Do not drink alcohol for 7 days, or as instructed by your health care provider.  Eat soft foods for the first 24 hours. SEEK MEDICAL CARE IF: You have persistent spotting of blood in your stool 2 3 days after the procedure. SEEK IMMEDIATE MEDICAL CARE IF:  You have more than a small spotting of blood in your stool.  You pass large blood clots in your stool.  Your abdomen is swollen  (distended).  You have nausea or vomiting.  You have a fever.  You have increasing abdominal pain that is not relieved with medicine. Document Released: 02/28/2004 Document Revised: 05/06/2013 Document Reviewed: 03/23/2013 [facility] Patient Information 2014 [place]. Clear liquids only until 6 PM and if doing well may have soft solids tonight and then if doing well may advance further tomorrow and call for biopsy report in one week and call sooner if question or problem and followup in the office as needed over the years and will decide repeat colonoscopy based on pathology probably 3 years

## 2013-11-02 ENCOUNTER — Encounter (HOSPITAL_COMMUNITY): Payer: Self-pay | Admitting: Gastroenterology

## 2013-12-09 ENCOUNTER — Encounter: Payer: Self-pay | Admitting: Obstetrics & Gynecology

## 2014-02-16 ENCOUNTER — Ambulatory Visit: Payer: Self-pay | Admitting: Obstetrics & Gynecology

## 2014-03-05 ENCOUNTER — Ambulatory Visit (INDEPENDENT_AMBULATORY_CARE_PROVIDER_SITE_OTHER): Payer: MEDICARE | Admitting: Obstetrics & Gynecology

## 2014-03-05 ENCOUNTER — Telehealth: Payer: Self-pay | Admitting: Obstetrics & Gynecology

## 2014-03-05 ENCOUNTER — Encounter: Payer: Self-pay | Admitting: Obstetrics & Gynecology

## 2014-03-05 VITALS — BP 122/78 | HR 64 | Resp 16 | Ht 61.75 in | Wt 165.0 lb

## 2014-03-05 DIAGNOSIS — L659 Nonscarring hair loss, unspecified: Secondary | ICD-10-CM

## 2014-03-05 DIAGNOSIS — Z01419 Encounter for gynecological examination (general) (routine) without abnormal findings: Secondary | ICD-10-CM

## 2014-03-05 DIAGNOSIS — R7989 Other specified abnormal findings of blood chemistry: Secondary | ICD-10-CM

## 2014-03-05 DIAGNOSIS — Z0000 Encounter for general adult medical examination without abnormal findings: Secondary | ICD-10-CM

## 2014-03-05 DIAGNOSIS — E7800 Pure hypercholesterolemia, unspecified: Secondary | ICD-10-CM

## 2014-03-05 LAB — POCT URINALYSIS DIPSTICK
BILIRUBIN UA: NEGATIVE
Glucose, UA: NEGATIVE
KETONES UA: NEGATIVE
LEUKOCYTES UA: NEGATIVE
Nitrite, UA: NEGATIVE
PROTEIN UA: NEGATIVE
RBC UA: NEGATIVE
Urobilinogen, UA: NEGATIVE
pH, UA: 5

## 2014-03-05 MED ORDER — ZOSTER VACCINE LIVE 19400 UNT/0.65ML ~~LOC~~ SOLR: 0.6500 mL | Freq: Once | SUBCUTANEOUS | Status: DC

## 2014-03-05 NOTE — Telephone Encounter (Signed)
Cheira returning your call from [facility]. Patient's chart has gone to storage. Epworth Callas will call with the tdap dates after she receives her chart.

## 2014-03-05 NOTE — Telephone Encounter (Signed)
We have the date Its in the pt chart

## 2014-03-05 NOTE — Telephone Encounter (Signed)
Sharee Pimple [employer] at [place] calling to let us know they will have to request the chart from storage. She wants to know if we need her to do that?

## 2014-03-05 NOTE — Progress Notes (Signed)
68 y.o. [id number] MarriedCaucasianF here for annual exam.  No vaginal bleeding.      Patient's last menstrual period was 07/30/1980.          Sexually active: Yes.    The current method of family planning is status post hysterectomy.    Exercising: Yes.    walking and stretches Smoker:  no  Health Maintenance: Pap:  03/11/13 WNL/negative HR HPV History of abnormal Pap:  Yes 30 years ago MMG:  04/01/13-normal Colonoscopy:  2014-polyps and watching an area-repeat 4/15 to check area-Dr Magod's office to call when needs next one BMD:   2014-osteopenia TDaP:  8/14 Screening Labs: will come back, Hb today: will come back, Urine today: negative    reports that she has never smoked. She has never used smokeless tobacco. She reports that she does not drink alcohol or use illicit drugs.  Past Medical History  Diagnosis Date  . Osteopenia 2009  . Endometriosis 1982    found at [place]  . Dyspareunia     dryness with intercourse  . Heart murmur   . Urinary incontinence     with coughing/sneezing    Past Surgical History  Procedure Laterality Date  . Dilatation and curettage  1982  . Abdominal hysterectomy  1982    TAH--for abnormal paps/endometriosis  . Augmentation mammaplasty  1977  . Cosmetic surgery  11/2011    face lift,neck lift, eye lift--Dr. Stephanie Coup  . Colonoscopy with propofol N/A 10/29/2013    Procedure: COLONOSCOPY WITH PROPOFOL;  Surgeon: Jeryl Columbia, MD;  Location: [facility] END[facility];  Service: Endoscopy;  Laterality: N/A;    Current Outpatient Prescriptions  Medication Sig Dispense Refill  . aspirin 81 MG tablet Take 81 mg by mouth daily.      . calcium carbonate (OS-CAL) 600 MG TABS tablet Take 600 mg by mouth daily with breakfast.       . calcium carbonate (TUMS - DOSED IN MG ELEMENTAL CALCIUM) 500 MG chewable tablet Chew 1 tablet by mouth daily.      . cholecalciferol (VITAMIN D) 1000 UNITS tablet Take 1,000 Units by mouth daily.      . fish oil-omega-3 fatty acids 1000 MG capsule  Take 1 g by mouth daily.       . fluticasone (FLONASE) 50 MCG/ACT nasal spray Place 1 spray into both nostrils daily.      Marland Kitchen ibuprofen (ADVIL,MOTRIN) 200 MG tablet Take 200-400 mg by mouth every 6 (six) hours as needed for headache.      . loratadine (CLARITIN) 10 MG tablet Take 10 mg by mouth daily.      . Multiple Vitamin (MULTIVITAMIN) capsule Take 1 capsule by mouth daily.       No current facility-administered medications for this visit.    Family History  Problem Relation Age of Onset  . Cancer Mother   . Cervical cancer Mother   . Diabetes Father     adult onset  . Stroke Father   . Thyroid disease Sister     hyperthyroid  . Asthma Sister   . Cancer Maternal Grandmother   . Stomach cancer Maternal Grandmother   . Thyroid disease Sister     hypothyroid    ROS:  Pertinent items are noted in HPI.  Otherwise, a comprehensive ROS was negative.  Exam:   BP 122/78  Pulse 64  Resp 16  Ht 5' 1.75" (1.568 m)  Wt 165 lb (74.844 kg)  BMI 30.44 kg/m2  LMP 07/30/1980  Weight change: @  WEIGHTCHANGE@ Height:   Height: 5' 1.75" (156.8 cm)  Ht Readings from Last 3 Encounters:  03/05/14 5' 1.75" (1.568 m)  10/29/13 5\' 2"  (1.575 m)  10/29/13 5\' 2"  (1.575 m)    General appearance: alert, cooperative and appears stated age Head: Normocephalic, without obvious abnormality, atraumatic Neck: no adenopathy, supple, symmetrical, trachea midline and thyroid normal to inspection and palpation Lungs: clear to auscultation bilaterally Breasts: normal appearance, no masses or tenderness  With bilateral implants Heart: regular rate and rhythm Abdomen: soft, non-tender; bowel sounds normal; no masses,  no organomegaly Extremities: extremities normal, atraumatic, no cyanosis or edema Skin: Skin color, texture, turgor normal. No rashes or lesions Lymph nodes: Cervical, supraclavicular, and axillary nodes normal. No abnormal inguinal nodes palpated Neurologic: Grossly normal   Pelvic:  External genitalia:  no lesions              Urethra:  normal appearing urethra with no masses, tenderness or lesions              Bartholins and Skenes: normal                 Vagina: normal appearing vagina with normal color and discharge, no lesions              Cervix: absent              Pap taken: No. Bimanual Exam:  Uterus:  uterus absent              Adnexa: no mass, fullness, tenderness               Rectovaginal: Confirms               Anus:  normal sphincter tone, no lesions  A:  Normal PMP female gyn exam Vaginal atrophic changes H/O TAH due to abnormal Pap smears, 1982.  Normal paps done for 30  P:  Mammogram yearly Pap with neg HR HPV 2014.  No pap today. Seeing dermatologist yearly Coconut oil and astroglide discussed for lubicration Return for fasting labs  An After Visit Summary was printed and given to the patient.

## 2014-03-10 ENCOUNTER — Other Ambulatory Visit (INDEPENDENT_AMBULATORY_CARE_PROVIDER_SITE_OTHER): Payer: MEDICARE

## 2014-03-10 DIAGNOSIS — R899 Unspecified abnormal finding in specimens from other organs, systems and tissues: Secondary | ICD-10-CM

## 2014-03-10 DIAGNOSIS — L659 Nonscarring hair loss, unspecified: Secondary | ICD-10-CM

## 2014-03-10 DIAGNOSIS — E7800 Pure hypercholesterolemia, unspecified: Secondary | ICD-10-CM

## 2014-03-10 DIAGNOSIS — R7989 Other specified abnormal findings of blood chemistry: Secondary | ICD-10-CM

## 2014-03-10 LAB — LIPID PANEL
Cholesterol: 158 mg/dL (ref 0–200)
HDL: 50 mg/dL (ref >39)
LDL Cholesterol: 94 mg/dL (ref 0–99)
TRIGLYCERIDES: 71 mg/dL (ref <150)
Total CHOL/HDL Ratio: 3.2 Ratio
VLDL: 14 mg/dL (ref 0–40)

## 2014-03-10 LAB — COMPREHENSIVE METABOLIC PANEL
ALK PHOS: 92 U/L (ref 39–117)
ALT: 25 U/L (ref 0–35)
AST: 22 U/L (ref 0–37)
Albumin: 4.1 g/dL (ref 3.5–5.2)
BUN: 18 mg/dL (ref 6–23)
CO2: 27 meq/L (ref 19–32)
CREATININE: 0.79 mg/dL (ref 0.50–1.10)
Calcium: 10 mg/dL (ref 8.4–10.5)
Chloride: 105 mEq/L (ref 96–112)
Glucose, Bld: 90 mg/dL (ref 70–99)
Potassium: 4.3 mEq/L (ref 3.5–5.3)
Sodium: 138 mEq/L (ref 135–145)
Total Bilirubin: 0.9 mg/dL (ref 0.2–1.2)
Total Protein: 7 g/dL (ref 6.0–8.3)

## 2014-03-10 LAB — TSH: TSH: 5.479 u[IU]/mL — ABNORMAL HIGH (ref 0.350–4.500)

## 2014-03-11 LAB — VITAMIN D 25 HYDROXY (VIT D DEFICIENCY, FRACTURES): VIT D 25 HYDROXY: 39 ng/mL (ref 30–89)

## 2014-04-07 ENCOUNTER — Other Ambulatory Visit (INDEPENDENT_AMBULATORY_CARE_PROVIDER_SITE_OTHER): Payer: MEDICARE

## 2014-04-07 DIAGNOSIS — L659 Nonscarring hair loss, unspecified: Secondary | ICD-10-CM

## 2014-04-07 DIAGNOSIS — R899 Unspecified abnormal finding in specimens from other organs, systems and tissues: Secondary | ICD-10-CM

## 2014-04-07 LAB — TSH: TSH: 3.687 u[IU]/mL (ref 0.350–4.500)

## 2014-05-31 ENCOUNTER — Encounter: Payer: Self-pay | Admitting: Obstetrics & Gynecology

## 2014-10-26 ENCOUNTER — Telehealth: Payer: Self-pay

## 2014-10-26 DIAGNOSIS — R899 Unspecified abnormal finding in specimens from other organs, systems and tissues: Secondary | ICD-10-CM

## 2014-10-26 NOTE — Telephone Encounter (Signed)
-----   Message from Maryann Conners, [place] sent at 04/13/2014  9:13 AM EDT ----- Needs TSH rechecked in 2/16

## 2014-10-26 NOTE — Telephone Encounter (Signed)
lmtcb to see if she would like to schedule this recheck?//kn

## 2014-11-24 NOTE — Telephone Encounter (Signed)
Trying to reach patient-to have thyroid rechecked. Appointment made for 4/28. Aware Dr Sabra Heck is out of the office, so she will get results next week.//kn

## 2014-11-25 ENCOUNTER — Other Ambulatory Visit (INDEPENDENT_AMBULATORY_CARE_PROVIDER_SITE_OTHER): Payer: Medicare Other

## 2014-11-25 DIAGNOSIS — R899 Unspecified abnormal finding in specimens from other organs, systems and tissues: Secondary | ICD-10-CM

## 2014-11-25 DIAGNOSIS — E038 Other specified hypothyroidism: Secondary | ICD-10-CM

## 2014-11-25 LAB — TSH: TSH: 4.501 u[IU]/mL — AB (ref 0.350–4.500)

## 2014-11-30 ENCOUNTER — Telehealth: Payer: Self-pay | Admitting: Obstetrics & Gynecology

## 2014-11-30 NOTE — Telephone Encounter (Signed)
Call to [facility] at providers office. Patient is to arrive at 1:15 for 1:30 appt with Dr Chalmers Cater on June 09,2016.  Faxed labs to [employer]

## 2014-11-30 NOTE — Telephone Encounter (Signed)
Heather Brown with Dr.Balan's office asking for Heather Brown to return call. Heather Brown has appointment info and is requesting labs only fax: [phone].

## 2014-11-30 NOTE — Telephone Encounter (Signed)
Call to patient. Advised that she is to arrive at Dr Porfirio Oar office at 1:15 for a 1:30 appointment on January 06, 2015.  also provided telephone # for Dr Porfirio Oar office. Patient agreeable.

## 2014-12-16 ENCOUNTER — Other Ambulatory Visit: Payer: Self-pay | Admitting: Gastroenterology

## 2014-12-16 NOTE — Addendum Note (Signed)
Addended byClarene Essex on: 12/16/2014 02:33 PM   Modules accepted: Orders

## 2014-12-21 ENCOUNTER — Encounter (HOSPITAL_COMMUNITY): Payer: Self-pay | Admitting: *Deleted

## 2014-12-21 NOTE — Progress Notes (Signed)
Pt denies SOB, chest pain, and being under the care of a cardiologist. Pt made aware to stop taking  Aspirin, otc vitamins and herbal medications such as fish oil. Do not take any NSAIDs ie: Ibuprofen, Advil, Naproxen or any medication containing Aspirin. Pt verbalized understanding of all pre-op instructions.

## 2014-12-22 ENCOUNTER — Encounter (HOSPITAL_COMMUNITY)
Admission: RE | Disposition: A | Discharge status: Home / Self Care | Payer: Self-pay | Source: Ambulatory Visit | Attending: Gastroenterology

## 2014-12-22 ENCOUNTER — Encounter (HOSPITAL_COMMUNITY): Payer: Self-pay | Admitting: Anesthesiology

## 2014-12-22 ENCOUNTER — Ambulatory Visit (HOSPITAL_COMMUNITY): Payer: Medicare Other | Admitting: Anesthesiology

## 2014-12-22 ENCOUNTER — Ambulatory Visit (HOSPITAL_COMMUNITY)
Admission: RE | Admit: 2014-12-22 | Discharge: 2014-12-22 | Disposition: A | Discharge status: Home / Self Care | Payer: Medicare Other | Source: Ambulatory Visit | Attending: Gastroenterology | Admitting: Gastroenterology

## 2014-12-22 DIAGNOSIS — K219 Gastro-esophageal reflux disease without esophagitis: Secondary | ICD-10-CM | POA: Insufficient documentation

## 2014-12-22 DIAGNOSIS — Z86010 Personal history of colonic polyps: Secondary | ICD-10-CM | POA: Insufficient documentation

## 2014-12-22 DIAGNOSIS — K6389 Other specified diseases of intestine: Secondary | ICD-10-CM | POA: Diagnosis not present

## 2014-12-22 HISTORY — DX: Other specified postprocedural states: Z98.890

## 2014-12-22 HISTORY — DX: Gastro-esophageal reflux disease without esophagitis: K21.9

## 2014-12-22 HISTORY — DX: Nausea with vomiting, unspecified: R11.2

## 2014-12-22 HISTORY — PX: COLONOSCOPY WITH PROPOFOL: SHX5780

## 2014-12-22 HISTORY — DX: Pneumonia, unspecified organism: J18.9

## 2014-12-22 HISTORY — PX: HOT HEMOSTASIS: SHX5433

## 2014-12-22 SURGERY — COLONOSCOPY WITH PROPOFOL
Anesthesia: Monitor Anesthesia Care

## 2014-12-22 MED ORDER — LIDOCAINE HCL (CARDIAC) 20 MG/ML IV SOLN
INTRAVENOUS | Status: DC | PRN
  Administered 2014-12-22: 50 mg via INTRAVENOUS

## 2014-12-22 MED ORDER — LACTATED RINGERS IV SOLN
INTRAVENOUS | Status: DC | PRN
  Administered 2014-12-22: 09:00:00 via INTRAVENOUS

## 2014-12-22 MED ORDER — PROPOFOL INFUSION 10 MG/ML OPTIME
INTRAVENOUS | Status: DC | PRN
  Administered 2014-12-22: 120 ug/kg/min via INTRAVENOUS
  Administered 2014-12-22 (×2): via INTRAVENOUS

## 2014-12-22 MED ORDER — SODIUM CHLORIDE 0.9 % IV SOLN: INTRAVENOUS | Status: DC

## 2014-12-22 MED ORDER — LACTATED RINGERS IV SOLN: INTRAVENOUS | Status: DC

## 2014-12-22 NOTE — Progress Notes (Signed)
Heather Brown 9:14 AM  Subjective: Other than a sprained ankle patient does not have any new medical issues since I seen her and denies any GI complaints except for a rare few second periodic left lower quadrant discomfort which does not seem to wake her up or be related to any specific activities and she has not seen any blood and no other complaints  Objective: Vital signs stable afebrile exam please see preassessment evaluation  Assessment: Difficult to remove polyp  Plan: Okay to proceed with repeat colonoscopy with anesthesia assistance and the procedure and options were rediscussed with the patient and her husband  [facility] E  Pager [phone] After 5PM or if no answer call [phone]

## 2014-12-22 NOTE — Op Note (Signed)
[facility] [place], [address]   COLONOSCOPY PROCEDURE REPORT     EXAM DATE: 12/22/2014  PATIENT NAME:      Heather Brown, Heather Brown           MR #:      [id number]  BIRTHDATE:       May 27, 1946      VISIT #:     [phone]  ATTENDING:     Clarene Essex, MD     STATUS:     outpatient ASSISTANT:      Tory Emerald and Jiles Harold  INDICATIONS:  The patient is a 68 yr old female here for a colonoscopy due to high risk patient with personal history of colonic polyps. PROCEDURE PERFORMED:     Colonoscopy, surveillance MEDICATIONS:     Propofol 250 mg IV and Lidocaine 50 mg IV ESTIMATED BLOOD LOSS:     None  CONSENT: The patient understands the risks and benefits of the procedure and understands that these risks include, but are not limited to: sedation, allergic reaction, infection, perforation and/or bleeding. Alternative means of evaluation and treatment include, among others: physical exam, x-rays, and/or surgical intervention. The patient elects to proceed with this endoscopic procedure.  DESCRIPTION OF PROCEDURE: During intra-op preparation period all mechanical & medical equipment was checked for proper function. Hand hygiene and appropriate measures for infection prevention was taken. After the risks, benefits and alternatives of the procedure were thoroughly explained, Informed consent was verified, confirmed and timeout was successfully executed by the treatment team. A digital exam revealed no abnormalities of the rectum. The Pentax Ultra Slim [id number] endoscope was introduced through the anus and advanced to the cecum, which was identified by both the appendix and ileocecal valve. adequate The instrument was then slowly withdrawn as the colon was fully examined.Estimated blood loss is zero unless otherwise noted in this procedure report. the findings are recorded below      Retroflexed views revealed no abnormalities. The  scope was then completely withdrawn from the patient and the procedure terminated. SCOPE WITHDRAWAL TIME: see nurse's note    ADVERSE EVENTS:      There were no immediate complications.  IMPRESSIONS:     1. [place] ink tattoo seen in proximal sigmoid  at roughly 30 cm on withdrawal without residual polyp 2. Otherwise within normal limits to cecum  RECOMMENDATIONS:     follow-up when necessary otherwise probable repeat colonoscopy in 2-3 years using ultraslim scope and propofol as above RECALL:     as above  _____________________________ Clarene Essex, MD eSigned:  Clarene Essex, MD 12/22/2014 9:55 AM   cc:  Leighton Ruff, M.D and Arloa Koh, M.D.   CPT CODES: ICD CODES:  The ICD and CPT codes recommended by this software are interpretations from the data that the clinical staff has captured with the software.  The verification of the translation of this report to the ICD and CPT codes and modifiers is the sole responsibility of the health care institution and practicing physician where this report was generated.  [employer]. will not be held responsible for the validity of the ICD and CPT codes included on this report.  AMA assumes no liability for data contained or not contained herein. CPT is a Designer, television/film set of the [employer].   PATIENT NAME:  Heather Brown, Heather Brown MR#: [id number]

## 2014-12-22 NOTE — Anesthesia Postprocedure Evaluation (Signed)
  Anesthesia Post-op Note  Patient: Heather Brown  Procedure(s) Performed: Procedure(s) with comments: COLONOSCOPY WITH PROPOFOL (N/A) - ultra slim scope HOT HEMOSTASIS (ARGON PLASMA COAGULATION/BICAP) (N/A)  Patient Location: PACU  Anesthesia Type:MAC  Level of Consciousness: awake, alert , oriented and patient cooperative  Airway and Oxygen Therapy: Patient Spontanous Breathing  Post-op Pain: none  Post-op Assessment: Post-op Vital signs reviewed, Patient's Cardiovascular Status Stable, Respiratory Function Stable, Patent Airway, No signs of Nausea or vomiting and Pain level controlled  Post-op Vital Signs: stable  Last Vitals:  Filed Vitals:   12/22/14 1020  BP: 133/85  Pulse: 74  Temp:   Resp: 16    Complications: No apparent anesthesia complications

## 2014-12-22 NOTE — Discharge Instructions (Signed)
Call if question or problem or if pain increases or changes otherwise repeat colonoscopy in 2-3 yearsColonoscopy, Care After These instructions give you information on caring for yourself after your procedure. Your doctor may also give you more specific instructions. Call your doctor if you have any problems or questions after your procedure. HOME CARE  Do not drive for 24 hours.  Do not sign important papers or use machinery for 24 hours.  You may shower.  You may go back to your usual activities, but go slower for the first 24 hours.  Take rest breaks often during the first 24 hours.  Walk around or use warm packs on your belly (abdomen) if you have belly cramping or gas.  Drink enough fluids to keep your pee (urine) clear or pale yellow.  Resume your normal diet. Avoid heavy or fried foods.  Avoid drinking alcohol for 24 hours or as told by your doctor.  Only take medicines as told by your doctor. If a tissue sample (biopsy) was taken during the procedure:   Do not take aspirin or blood thinners for 7 days, or as told by your doctor.  Do not drink alcohol for 7 days, or as told by your doctor.  Eat soft foods for the first 24 hours. GET HELP IF: You still have a small amount of blood in your poop (stool) 2-3 days after the procedure. GET HELP RIGHT AWAY IF:  You have more than a small amount of blood in your poop.  You see clumps of tissue (blood clots) in your poop.  Your belly is puffy (swollen).  You feel sick to your stomach (nauseous) or throw up (vomit).  You have a fever.  You have belly pain that gets worse and medicine does not help. MAKE SURE YOU:  Understand these instructions.  Will watch your condition.  Will get help right away if you are not doing well or get worse. Document Released: 08/18/2010 Document Revised: 07/21/2013 Document Reviewed: 03/23/2013 [facility] Patient Information 2015 [place], [place]. This information is not intended to replace  advice given to you by your health care provider. Make sure you discuss any questions you have with your health care provider.

## 2014-12-22 NOTE — Anesthesia Preprocedure Evaluation (Signed)
Anesthesia Evaluation  Patient identified by MRN, date of birth, ID band Patient awake    Reviewed: Allergy & Precautions, NPO status , Patient's Chart, lab work & pertinent test results  History of Anesthesia Complications (+) PONV  Airway Mallampati: I       Dental   Pulmonary    Pulmonary exam normal       Cardiovascular Normal cardiovascular exam    Neuro/Psych    GI/Hepatic GERD-  ,  Endo/Other    Renal/GU      Musculoskeletal   Abdominal   Peds  Hematology   Anesthesia Other Findings   Reproductive/Obstetrics                             Anesthesia Physical Anesthesia Plan  ASA: II  Anesthesia Plan: MAC   Post-op Pain Management:    Induction:   Airway Management Planned: Simple Face Mask  Additional Equipment:   Intra-op Plan:   Post-operative Plan:   Informed Consent: I have reviewed the patients History and Physical, chart, labs and discussed the procedure including the risks, benefits and alternatives for the proposed anesthesia with the patient or authorized representative who has indicated his/her understanding and acceptance.     Plan Discussed with: CRNA, Anesthesiologist and Surgeon  Anesthesia Plan Comments:         Anesthesia Quick Evaluation

## 2014-12-22 NOTE — Transfer of Care (Signed)
Immediate Anesthesia Transfer of Care Note  Patient: Heather Brown  Procedure(s) Performed: Procedure(s) with comments: COLONOSCOPY WITH PROPOFOL (N/A) - ultra slim scope HOT HEMOSTASIS (ARGON PLASMA COAGULATION/BICAP) (N/A)  Patient Location: PACU  Anesthesia Type:MAC  Level of Consciousness: awake, alert  and oriented  Airway & Oxygen Therapy: Patient Spontanous Breathing and Patient connected to nasal cannula oxygen  Post-op Assessment: Report given to RN and Post -op Vital signs reviewed and stable  Post vital signs: Reviewed and stable  Last Vitals:  Filed Vitals:   12/22/14 1001  BP:   Pulse:   Temp:   Resp: 19    Complications: No apparent anesthesia complications

## 2014-12-22 NOTE — Anesthesia Procedure Notes (Signed)
Procedure Name: MAC Date/Time: 12/22/2014 9:18 AM Performed by: Eligha Bridegroom Pre-anesthesia Checklist: Patient identified, Emergency Drugs available, Suction available, Patient being monitored and Timeout performed Patient Re-evaluated:Patient Re-evaluated prior to inductionOxygen Delivery Method: Nasal cannula Preoxygenation: Pre-oxygenation with 100% oxygen Intubation Type: IV induction

## 2014-12-23 ENCOUNTER — Encounter (HOSPITAL_COMMUNITY): Payer: Self-pay | Admitting: Gastroenterology

## 2015-01-06 ENCOUNTER — Telehealth: Payer: Self-pay | Admitting: *Deleted

## 2015-01-06 NOTE — Telephone Encounter (Signed)
Caren Griffins at Dr Chalmers Cater calling requesting verbal report of patient TSH report from one month ago. Result given and faxed to [phone]. See result note, we referred to Dr Chalmers Cater.  Routing to provider for final review. Patient agreeable to disposition. Will close encounter.

## 2015-02-09 ENCOUNTER — Other Ambulatory Visit: Payer: Self-pay | Admitting: Endocrinology

## 2015-02-09 DIAGNOSIS — R609 Edema, unspecified: Secondary | ICD-10-CM

## 2015-02-10 ENCOUNTER — Ambulatory Visit
Admission: RE | Admit: 2015-02-10 | Discharge: 2015-02-10 | Disposition: A | Discharge status: Home / Self Care | Payer: Medicare Other | Source: Ambulatory Visit | Attending: Endocrinology | Admitting: Endocrinology

## 2015-02-10 DIAGNOSIS — R609 Edema, unspecified: Secondary | ICD-10-CM

## 2015-03-04 ENCOUNTER — Other Ambulatory Visit: Payer: Self-pay | Admitting: Endocrinology

## 2015-03-04 DIAGNOSIS — E010 Iodine-deficiency related diffuse (endemic) goiter: Secondary | ICD-10-CM

## 2015-04-07 ENCOUNTER — Encounter: Payer: Self-pay | Admitting: Obstetrics & Gynecology

## 2015-04-07 ENCOUNTER — Ambulatory Visit (INDEPENDENT_AMBULATORY_CARE_PROVIDER_SITE_OTHER): Payer: Medicare Other | Admitting: Obstetrics & Gynecology

## 2015-04-07 VITALS — BP 130/78 | HR 68 | Resp 16 | Ht 62.25 in | Wt 170.0 lb

## 2015-04-07 DIAGNOSIS — Z0000 Encounter for general adult medical examination without abnormal findings: Secondary | ICD-10-CM | POA: Diagnosis not present

## 2015-04-07 DIAGNOSIS — M8580 Other specified disorders of bone density and structure, unspecified site: Secondary | ICD-10-CM

## 2015-04-07 DIAGNOSIS — E559 Vitamin D deficiency, unspecified: Secondary | ICD-10-CM

## 2015-04-07 DIAGNOSIS — E7800 Pure hypercholesterolemia, unspecified: Secondary | ICD-10-CM

## 2015-04-07 DIAGNOSIS — Z01419 Encounter for gynecological examination (general) (routine) without abnormal findings: Secondary | ICD-10-CM

## 2015-04-07 DIAGNOSIS — Z124 Encounter for screening for malignant neoplasm of cervix: Secondary | ICD-10-CM

## 2015-04-07 DIAGNOSIS — E038 Other specified hypothyroidism: Secondary | ICD-10-CM | POA: Diagnosis not present

## 2015-04-07 DIAGNOSIS — E039 Hypothyroidism, unspecified: Secondary | ICD-10-CM | POA: Insufficient documentation

## 2015-04-07 LAB — POCT URINALYSIS DIPSTICK
Bilirubin, UA: NEGATIVE
Blood, UA: NEGATIVE
Glucose, UA: NEGATIVE
KETONES UA: NEGATIVE
Leukocytes, UA: NEGATIVE
Nitrite, UA: NEGATIVE
PH UA: 5
PROTEIN UA: NEGATIVE
UROBILINOGEN UA: NEGATIVE

## 2015-04-07 LAB — LIPID PANEL
CHOL/HDL RATIO: 3.8 ratio (ref <=5.0)
CHOLESTEROL: 159 mg/dL (ref 125–200)
HDL: 42 mg/dL — AB (ref >=46)
LDL Cholesterol: 99 mg/dL (ref <130)
Triglycerides: 88 mg/dL (ref <150)
VLDL: 18 mg/dL (ref <30)

## 2015-04-07 NOTE — Progress Notes (Signed)
69 y.o. [id number] MarriedCaucasianF here for annual exam.  Doing well.  No vaginal bleeding.  Did have colonoscopy with Dr. Watt Climes 5/16.  Needs follow up in 2-3 years.    Seeing Dr. Chalmers Cater.  Has a nodule on her thyroid is begin followed.  Has follow up scheduled 12/16.  Had thyroid testing in April.  Not needed today.  Patient's last menstrual period was 07/30/1980.          Sexually active: Yes.    The current method of family planning is status post hysterectomy.    Exercising: No.  not regularly Smoker:  no  Health Maintenance: Pap:  03/11/13 WNL/negative HR HPV History of abnormal Pap:  Yes years ago MMG:  04/01/13 BiRads 1-normal Colonoscopy:  12/22/14-Dr Magod repeat in 2-3 years or PRN BMD:   2014, -2.1/-2.2 TDaP:  03/11/13 Screening Labs: Dr. Drema Dallas, Hb today: 14.9, Urine today: negative   reports that she has never smoked. She has never used smokeless tobacco. She reports that she drinks alcohol. She reports that she does not use illicit drugs.  Past Medical History  Diagnosis Date  . Osteopenia 2009  . Endometriosis 1982    found at [facility]  . Dyspareunia     dryness with intercourse  . Heart murmur   . Urinary incontinence     with coughing/sneezing  . PONV (postoperative nausea and vomiting)   . Pneumonia   . GERD (gastroesophageal reflux disease)   . Thyroid nodule     followed by Dr Chalmers Cater    Past Surgical History  Procedure Laterality Date  . Dilatation and curettage  1982  . Abdominal hysterectomy  1982    TAH--for abnormal paps/endometriosis  . Augmentation mammaplasty  1977  . Cosmetic surgery  11/2011    face lift,neck lift, eye lift--Dr. Stephanie Coup  . Colonoscopy with propofol N/A 10/29/2013    Procedure: COLONOSCOPY WITH PROPOFOL;  Surgeon: Jeryl Columbia, MD;  Location: [facility] ENDOSCOPY;  Service: Endoscopy;  Laterality: N/A;  . Tonsillectomy    . Colonoscopy with propofol N/A 12/22/2014    Procedure: COLONOSCOPY WITH PROPOFOL;  Surgeon: Clarene Essex, MD;  Location: [facility]  ENDOSCOPY;  Service: Endoscopy;  Laterality: N/A;  ultra slim scope  . Hot hemostasis N/A 12/22/2014    Procedure: HOT HEMOSTASIS (ARGON PLASMA COAGULATION/BICAP);  Surgeon: Clarene Essex, MD;  Location: [facility] ENDOSCOPY;  Service: Endoscopy;  Laterality: N/A;    Family History  Problem Relation Age of Onset  . Cancer Mother   . Cervical cancer Mother   . Diabetes Father     adult onset  . Stroke Father   . Thyroid disease Sister     hyperthyroid  . Asthma Sister   . Cancer Maternal Grandmother   . Stomach cancer Maternal Grandmother   . Thyroid disease Sister     hypothyroid    ROS:  Pertinent items are noted in HPI.  Otherwise, a comprehensive ROS was negative.  Exam:   BP 130/78 mmHg  Pulse 68  Resp 16  Ht 5' 2.25" (1.581 m)  Wt 170 lb (77.111 kg)  BMI 30.85 kg/m2  LMP 07/30/1980  Weight change: +5#  Height: 5' 2.25" (158.1 cm)  Ht Readings from Last 3 Encounters:  04/07/15 5' 2.25" (1.581 m)  03/05/14 5' 1.75" (1.568 m)  10/29/13 5\' 2"  (1.575 m)    General appearance: alert, cooperative and appears stated age Head: Normocephalic, without obvious abnormality, atraumatic Neck: no adenopathy, supple, symmetrical, trachea midline and thyroid normal to inspection and  palpation Lungs: clear to auscultation bilaterally Breasts: normal appearance, no masses or tenderness, bilateral implants Heart: regular rate and rhythm Abdomen: soft, non-tender; bowel sounds normal; no masses,  no organomegaly Extremities: extremities normal, atraumatic, no cyanosis or edema Skin: Skin color, texture, turgor normal. No rashes or lesions Lymph nodes: Cervical, supraclavicular, and axillary nodes normal. No abnormal inguinal nodes palpated Neurologic: Grossly normal   Pelvic: External genitalia:  no lesions              Urethra:  normal appearing urethra with no masses, tenderness or lesions              Bartholins and Skenes: normal                 Vagina: normal appearing vagina with  normal color and discharge, no lesions              Cervix: no lesions              Pap taken: Yes.   Bimanual Exam:  Uterus:  normal size, contour, position, consistency, mobility, non-tender              Adnexa: normal adnexa and no mass, fullness, tenderness               Rectovaginal: Confirms               Anus:  normal sphincter tone, no lesions  Chaperone was present for exam.  A:  Normal PMP femal No HRT Vaginal atrophic changes H/O TAH due to abnormal Pap smears, 1982 Hypothyroidism Breast implants  P: Mammogram yearly Pap with neg HR HPV 2014. Pap only today.. Seeing dermatologist yearly Seeing Dr. Chalmers Cater.  Had thyroid ultrasound scheduled for December.  Follow up with Dr. Chalmers Cater after ultrasound Lipids and Vit D Pt will call Dr. Drema Dallas for pneumonia vaccine Return annually or prn with issues

## 2015-04-08 ENCOUNTER — Ambulatory Visit: Payer: MEDICARE | Admitting: Obstetrics & Gynecology

## 2015-04-08 LAB — VITAMIN D 25 HYDROXY (VIT D DEFICIENCY, FRACTURES): VIT D 25 HYDROXY: 34 ng/mL (ref 30–100)

## 2015-04-08 LAB — IPS PAP SMEAR ONLY

## 2015-04-11 ENCOUNTER — Telehealth: Payer: Self-pay

## 2015-04-11 LAB — HEMOGLOBIN, FINGERSTICK: HEMOGLOBIN, FINGERSTICK: 14.9 g/dL (ref 12.0–16.0)

## 2015-04-11 NOTE — Telephone Encounter (Signed)
-----   Message from Megan Salon, MD sent at 04/08/2015 10:39 AM EDT ----- Inform Vit D ok at 25.  Continue her supplement.  Cholesterol good at 159.  LDLs 99.  HDLs a little low at 42.  Exercise will help increase this number.  Ratio is normal.

## 2015-04-11 NOTE — Telephone Encounter (Signed)
Lmtcb//kn 

## 2015-04-19 NOTE — Telephone Encounter (Signed)
Patient notified of all results.//kn 

## 2015-07-11 ENCOUNTER — Ambulatory Visit
Admission: RE | Admit: 2015-07-11 | Discharge: 2015-07-11 | Disposition: A | Discharge status: Home / Self Care | Payer: Medicare Other | Source: Ambulatory Visit | Attending: Endocrinology | Admitting: Endocrinology

## 2015-07-11 DIAGNOSIS — E010 Iodine-deficiency related diffuse (endemic) goiter: Secondary | ICD-10-CM

## 2015-07-31 DIAGNOSIS — Z8781 Personal history of (healed) traumatic fracture: Secondary | ICD-10-CM

## 2015-07-31 HISTORY — DX: Personal history of (healed) traumatic fracture: Z87.81

## 2015-12-07 DIAGNOSIS — D1801 Hemangioma of skin and subcutaneous tissue: Secondary | ICD-10-CM | POA: Diagnosis not present

## 2015-12-07 DIAGNOSIS — L821 Other seborrheic keratosis: Secondary | ICD-10-CM | POA: Diagnosis not present

## 2015-12-07 DIAGNOSIS — L570 Actinic keratosis: Secondary | ICD-10-CM | POA: Diagnosis not present

## 2015-12-07 DIAGNOSIS — L814 Other melanin hyperpigmentation: Secondary | ICD-10-CM | POA: Diagnosis not present

## 2015-12-07 DIAGNOSIS — D2362 Other benign neoplasm of skin of left upper limb, including shoulder: Secondary | ICD-10-CM | POA: Diagnosis not present

## 2015-12-14 DIAGNOSIS — H903 Sensorineural hearing loss, bilateral: Secondary | ICD-10-CM | POA: Diagnosis not present

## 2016-03-27 ENCOUNTER — Other Ambulatory Visit: Payer: Self-pay | Admitting: Endocrinology

## 2016-03-27 DIAGNOSIS — E041 Nontoxic single thyroid nodule: Secondary | ICD-10-CM

## 2016-06-13 DIAGNOSIS — S4292XD Fracture of left shoulder girdle, part unspecified, subsequent encounter for fracture with routine healing: Secondary | ICD-10-CM | POA: Diagnosis not present

## 2016-06-18 DIAGNOSIS — S4982XD Other specified injuries of left shoulder and upper arm, subsequent encounter: Secondary | ICD-10-CM | POA: Diagnosis not present

## 2016-06-18 DIAGNOSIS — M1612 Unilateral primary osteoarthritis, left hip: Secondary | ICD-10-CM | POA: Diagnosis not present

## 2016-07-03 DIAGNOSIS — S42255D Nondisplaced fracture of greater tuberosity of left humerus, subsequent encounter for fracture with routine healing: Secondary | ICD-10-CM | POA: Diagnosis not present

## 2016-07-03 DIAGNOSIS — M25552 Pain in left hip: Secondary | ICD-10-CM | POA: Diagnosis not present

## 2016-07-03 DIAGNOSIS — M7502 Adhesive capsulitis of left shoulder: Secondary | ICD-10-CM | POA: Diagnosis not present

## 2016-07-03 DIAGNOSIS — S42294D Other nondisplaced fracture of upper end of right humerus, subsequent encounter for fracture with routine healing: Secondary | ICD-10-CM | POA: Diagnosis not present

## 2016-07-10 ENCOUNTER — Ambulatory Visit
Admission: RE | Admit: 2016-07-10 | Discharge: 2016-07-10 | Disposition: A | Discharge status: Home / Self Care | Payer: Medicare Other | Source: Ambulatory Visit | Attending: Endocrinology | Admitting: Endocrinology

## 2016-07-10 DIAGNOSIS — E042 Nontoxic multinodular goiter: Secondary | ICD-10-CM | POA: Diagnosis not present

## 2016-07-10 DIAGNOSIS — E039 Hypothyroidism, unspecified: Secondary | ICD-10-CM | POA: Diagnosis not present

## 2016-07-10 DIAGNOSIS — E041 Nontoxic single thyroid nodule: Secondary | ICD-10-CM

## 2016-07-17 DIAGNOSIS — E039 Hypothyroidism, unspecified: Secondary | ICD-10-CM | POA: Diagnosis not present

## 2016-07-17 DIAGNOSIS — Z23 Encounter for immunization: Secondary | ICD-10-CM | POA: Diagnosis not present

## 2016-07-17 DIAGNOSIS — E041 Nontoxic single thyroid nodule: Secondary | ICD-10-CM | POA: Diagnosis not present

## 2016-08-03 ENCOUNTER — Encounter: Payer: Self-pay | Admitting: Obstetrics & Gynecology

## 2016-08-03 ENCOUNTER — Ambulatory Visit: Payer: Medicare Other | Admitting: Obstetrics & Gynecology

## 2016-08-03 ENCOUNTER — Telehealth: Payer: Self-pay | Admitting: Obstetrics & Gynecology

## 2016-08-03 NOTE — Telephone Encounter (Signed)
Patient is calling to find out what the name of the Cardiologist is that Dr. Sabra Heck referred her to.

## 2016-08-03 NOTE — Telephone Encounter (Signed)
It was 9/16 when I saw her last.  I am not sure of what cardiologist I mentioned to her.  Typically, she will need to be referred.  We can discuss this when she comes in for her AEX.  Thanks.

## 2016-08-03 NOTE — Telephone Encounter (Signed)
Patient canceled her annual for today due to insurance issues. She is scheduled for Jan 2019.

## 2016-08-03 NOTE — Telephone Encounter (Signed)
Spoke with patient, advised as seen below per Dr. Sabra Heck. Patient states she was not able to be seen due to insurance not covering visit. Patient ask if PCP can make a referral to cardiologist if needed? Advised patient, yes, pcp can make referral. Patient verbalizes understanding and is agreeable.  Routing to provider for final review. Patient is agreeable to disposition. Will close encounter.

## 2016-08-03 NOTE — Telephone Encounter (Signed)
Spoke with patient. Patient would like to name of Cardiologist that Dr. Sabra Heck recommended to her. Patient states she is not currently having any problems, but feels she will need to establish care with one. Advised patient will review with Dr. Sabra Heck and return call with recommendations. Patient is agreeable.  Dr. Sabra Heck, please advise?

## 2016-08-03 NOTE — Progress Notes (Deleted)
72 y.o. [id number] MarriedCaucasianF here for annual exam.    Patient's last menstrual period was 07/30/1980.          Sexually active: {yes no:314532}  The current method of family planning is status post hysterectomy.    Exercising: {yes no:314532}  {types:19826} Smoker:  no  Health Maintenance: Pap:  04/07/15 negative  History of abnormal Pap:  {YES NO:22349} MMG:  *** Colonoscopy:  *** BMD:   *** TDaP:  *** Pneumonia vaccine(s):  *** Zostavax:   *** Hep C testing: *** Screening Labs: ***, Hb today: ***, Urine today: ***   reports that she has never smoked. She has never used smokeless tobacco. She reports that she drinks alcohol. She reports that she does not use drugs.  Past Medical History:  Diagnosis Date  . Dyspareunia    dryness with intercourse  . Endometriosis 1982   found at [facility]  . GERD (gastroesophageal reflux disease)   . Heart murmur   . Hypothyroidism   . Osteopenia 2009  . Pneumonia   . PONV (postoperative nausea and vomiting)   . Thyroid nodule    followed by Dr Chalmers Cater  . Urinary incontinence    with coughing/sneezing    Past Surgical History:  Procedure Laterality Date  . ABDOMINAL HYSTERECTOMY  1982   TAH--for abnormal paps/endometriosis  . AUGMENTATION MAMMAPLASTY  1977  . COLONOSCOPY WITH PROPOFOL N/A 10/29/2013   Procedure: COLONOSCOPY WITH PROPOFOL;  Surgeon: Jeryl Columbia, MD;  Location: [facility] ENDOSCOPY;  Service: Endoscopy;  Laterality: N/A;  . COLONOSCOPY WITH PROPOFOL N/A 12/22/2014   Procedure: COLONOSCOPY WITH PROPOFOL;  Surgeon: Clarene Essex, MD;  Location: [facility] ENDOSCOPY;  Service: Endoscopy;  Laterality: N/A;  ultra slim scope  . COSMETIC SURGERY  11/2011   face lift,neck lift, eye lift--Dr. Stephanie Coup  . dilatation and curettage  1982  . HOT HEMOSTASIS N/A 12/22/2014   Procedure: HOT HEMOSTASIS (ARGON PLASMA COAGULATION/BICAP);  Surgeon: Clarene Essex, MD;  Location: [facility] ENDOSCOPY;  Service: Endoscopy;  Laterality: N/A;  . TONSILLECTOMY      Current  Outpatient Prescriptions  Medication Sig Dispense Refill  . aspirin 81 MG tablet Take 81 mg by mouth daily.    . calcium carbonate (TUMS - DOSED IN MG ELEMENTAL CALCIUM) 500 MG chewable tablet Chew 1 tablet by mouth daily as needed for indigestion or heartburn.     . Cholecalciferol (VITAMIN D3) 1000 UNITS CAPS Take 1,000 Units by mouth daily.    . fish oil-omega-3 fatty acids 1000 MG capsule Take 1 g by mouth daily.     . fluticasone (FLONASE) 50 MCG/ACT nasal spray Place 1 spray into both nostrils daily as needed for allergies.     Marland Kitchen ibuprofen (ADVIL,MOTRIN) 200 MG tablet Take 200-400 mg by mouth daily as needed for moderate pain.     Marland Kitchen levothyroxine (SYNTHROID, LEVOTHROID) 50 MCG tablet TK 1 T PO QD  4  . loratadine (CLARITIN) 10 MG tablet Take 10 mg by mouth daily as needed for allergies.     . Multiple Vitamin (MULTIVITAMIN) capsule Take 1 capsule by mouth daily.     No current facility-administered medications for this visit.     Family History  Problem Relation Age of Onset  . Cancer Mother   . Cervical cancer Mother   . Diabetes Father     adult onset  . Stroke Father   . Thyroid disease Sister     hyperthyroid  . Asthma Sister   . Cancer Maternal Grandmother   .  Stomach cancer Maternal Grandmother   . Thyroid disease Sister     hypothyroid    ROS:  Pertinent items are noted in HPI.  Otherwise, a comprehensive ROS was negative.  Exam:   LMP 07/30/1980   Weight change: @WEIGHTCHANGE @ Height:      Ht Readings from Last 3 Encounters:  04/07/15 5' 2.25" (1.581 m)  03/05/14 5' 1.75" (1.568 m)  10/29/13 5\' 2"  (1.575 m)    General appearance: alert, cooperative and appears stated age Head: Normocephalic, without obvious abnormality, atraumatic Neck: no adenopathy, supple, symmetrical, trachea midline and thyroid {EXAM; THYROID:18604} Lungs: clear to auscultation bilaterally Breasts: {Exam; breast:13139::"normal appearance, no masses or tenderness"} Heart: regular rate  and rhythm Abdomen: soft, non-tender; bowel sounds normal; no masses,  no organomegaly Extremities: extremities normal, atraumatic, no cyanosis or edema Skin: Skin color, texture, turgor normal. No rashes or lesions Lymph nodes: Cervical, supraclavicular, and axillary nodes normal. No abnormal inguinal nodes palpated Neurologic: Grossly normal   Pelvic: External genitalia:  no lesions              Urethra:  normal appearing urethra with no masses, tenderness or lesions              Bartholins and Skenes: normal                 Vagina: normal appearing vagina with normal color and discharge, no lesions              Cervix: {exam; cervix:14595}              Pap taken: {yes no:314532} Bimanual Exam:  Uterus:  {exam; uterus:12215}              Adnexa: {exam; adnexa:12223}               Rectovaginal: Confirms               Anus:  normal sphincter tone, no lesions  Chaperone was present for exam.  A:  Well Woman with normal exam  P:   {plan; gyn:5269::"mammogram","pap smear","return annually or prn"}

## 2016-08-06 DIAGNOSIS — T180XXA Foreign body in mouth, initial encounter: Secondary | ICD-10-CM | POA: Diagnosis not present

## 2016-08-06 DIAGNOSIS — K045 Chronic apical periodontitis: Secondary | ICD-10-CM | POA: Diagnosis not present

## 2016-08-10 DIAGNOSIS — L308 Other specified dermatitis: Secondary | ICD-10-CM | POA: Diagnosis not present

## 2016-08-28 DIAGNOSIS — R21 Rash and other nonspecific skin eruption: Secondary | ICD-10-CM | POA: Diagnosis not present

## 2016-08-28 DIAGNOSIS — R6889 Other general symptoms and signs: Secondary | ICD-10-CM | POA: Diagnosis not present

## 2016-09-03 DIAGNOSIS — F419 Anxiety disorder, unspecified: Secondary | ICD-10-CM | POA: Diagnosis not present

## 2016-09-03 DIAGNOSIS — R5381 Other malaise: Secondary | ICD-10-CM | POA: Diagnosis not present

## 2016-09-03 DIAGNOSIS — R109 Unspecified abdominal pain: Secondary | ICD-10-CM | POA: Diagnosis not present

## 2016-09-04 ENCOUNTER — Other Ambulatory Visit: Payer: Self-pay | Admitting: Family Medicine

## 2016-09-05 ENCOUNTER — Other Ambulatory Visit: Payer: Self-pay | Admitting: Family Medicine

## 2016-09-05 DIAGNOSIS — R102 Pelvic and perineal pain: Secondary | ICD-10-CM

## 2016-09-05 DIAGNOSIS — R1084 Generalized abdominal pain: Secondary | ICD-10-CM

## 2016-09-25 ENCOUNTER — Ambulatory Visit
Admission: RE | Admit: 2016-09-25 | Discharge: 2016-09-25 | Disposition: A | Discharge status: Home / Self Care | Payer: Medicare Other | Source: Ambulatory Visit | Attending: Family Medicine | Admitting: Family Medicine

## 2016-09-25 DIAGNOSIS — R102 Pelvic and perineal pain: Secondary | ICD-10-CM

## 2016-09-25 DIAGNOSIS — R1084 Generalized abdominal pain: Secondary | ICD-10-CM

## 2016-09-25 DIAGNOSIS — R109 Unspecified abdominal pain: Secondary | ICD-10-CM | POA: Diagnosis not present

## 2016-11-07 DIAGNOSIS — M4722 Other spondylosis with radiculopathy, cervical region: Secondary | ICD-10-CM | POA: Diagnosis not present

## 2016-11-14 DIAGNOSIS — M4722 Other spondylosis with radiculopathy, cervical region: Secondary | ICD-10-CM | POA: Diagnosis not present

## 2016-11-19 DIAGNOSIS — M4722 Other spondylosis with radiculopathy, cervical region: Secondary | ICD-10-CM | POA: Diagnosis not present

## 2016-12-06 DIAGNOSIS — M4722 Other spondylosis with radiculopathy, cervical region: Secondary | ICD-10-CM | POA: Diagnosis not present

## 2016-12-16 DIAGNOSIS — W19XXXA Unspecified fall, initial encounter: Secondary | ICD-10-CM | POA: Diagnosis not present

## 2016-12-16 DIAGNOSIS — S82102A Unspecified fracture of upper end of left tibia, initial encounter for closed fracture: Secondary | ICD-10-CM | POA: Diagnosis not present

## 2016-12-16 DIAGNOSIS — M25562 Pain in left knee: Secondary | ICD-10-CM | POA: Diagnosis not present

## 2016-12-16 DIAGNOSIS — S82142A Displaced bicondylar fracture of left tibia, initial encounter for closed fracture: Secondary | ICD-10-CM | POA: Diagnosis not present

## 2016-12-16 DIAGNOSIS — S8992XA Unspecified injury of left lower leg, initial encounter: Secondary | ICD-10-CM | POA: Diagnosis not present

## 2016-12-16 DIAGNOSIS — S8292XA Unspecified fracture of left lower leg, initial encounter for closed fracture: Secondary | ICD-10-CM | POA: Diagnosis not present

## 2016-12-21 DIAGNOSIS — S83512A Sprain of anterior cruciate ligament of left knee, initial encounter: Secondary | ICD-10-CM | POA: Diagnosis not present

## 2017-01-04 DIAGNOSIS — S83512D Sprain of anterior cruciate ligament of left knee, subsequent encounter: Secondary | ICD-10-CM | POA: Diagnosis not present

## 2017-01-09 DIAGNOSIS — S83512D Sprain of anterior cruciate ligament of left knee, subsequent encounter: Secondary | ICD-10-CM | POA: Diagnosis not present

## 2017-01-15 DIAGNOSIS — S83512D Sprain of anterior cruciate ligament of left knee, subsequent encounter: Secondary | ICD-10-CM | POA: Diagnosis not present

## 2017-01-17 DIAGNOSIS — S83512D Sprain of anterior cruciate ligament of left knee, subsequent encounter: Secondary | ICD-10-CM | POA: Diagnosis not present

## 2017-01-22 DIAGNOSIS — S83512D Sprain of anterior cruciate ligament of left knee, subsequent encounter: Secondary | ICD-10-CM | POA: Diagnosis not present

## 2017-01-24 DIAGNOSIS — S83512D Sprain of anterior cruciate ligament of left knee, subsequent encounter: Secondary | ICD-10-CM | POA: Diagnosis not present

## 2017-01-31 DIAGNOSIS — S83512D Sprain of anterior cruciate ligament of left knee, subsequent encounter: Secondary | ICD-10-CM | POA: Diagnosis not present

## 2017-02-07 DIAGNOSIS — S83512D Sprain of anterior cruciate ligament of left knee, subsequent encounter: Secondary | ICD-10-CM | POA: Diagnosis not present

## 2017-02-11 DIAGNOSIS — S83512D Sprain of anterior cruciate ligament of left knee, subsequent encounter: Secondary | ICD-10-CM | POA: Diagnosis not present

## 2017-02-12 DIAGNOSIS — S83512D Sprain of anterior cruciate ligament of left knee, subsequent encounter: Secondary | ICD-10-CM | POA: Diagnosis not present

## 2017-02-14 DIAGNOSIS — H524 Presbyopia: Secondary | ICD-10-CM | POA: Diagnosis not present

## 2017-03-01 DIAGNOSIS — J019 Acute sinusitis, unspecified: Secondary | ICD-10-CM | POA: Diagnosis not present

## 2017-03-01 DIAGNOSIS — J069 Acute upper respiratory infection, unspecified: Secondary | ICD-10-CM | POA: Diagnosis not present

## 2017-05-17 DIAGNOSIS — Z23 Encounter for immunization: Secondary | ICD-10-CM | POA: Diagnosis not present

## 2017-05-17 DIAGNOSIS — R350 Frequency of micturition: Secondary | ICD-10-CM | POA: Diagnosis not present

## 2017-07-15 DIAGNOSIS — E039 Hypothyroidism, unspecified: Secondary | ICD-10-CM | POA: Diagnosis not present

## 2017-07-16 ENCOUNTER — Other Ambulatory Visit: Payer: Self-pay | Admitting: Family Medicine

## 2017-07-16 DIAGNOSIS — M8580 Other specified disorders of bone density and structure, unspecified site: Secondary | ICD-10-CM

## 2017-07-17 DIAGNOSIS — E039 Hypothyroidism, unspecified: Secondary | ICD-10-CM | POA: Diagnosis not present

## 2017-07-17 DIAGNOSIS — E041 Nontoxic single thyroid nodule: Secondary | ICD-10-CM | POA: Diagnosis not present

## 2017-08-08 ENCOUNTER — Other Ambulatory Visit: Payer: Self-pay | Admitting: Family Medicine

## 2017-08-08 ENCOUNTER — Ambulatory Visit
Admission: RE | Admit: 2017-08-08 | Discharge: 2017-08-08 | Disposition: A | Discharge status: Home / Self Care | Payer: Medicare Other | Source: Ambulatory Visit | Attending: Family Medicine | Admitting: Family Medicine

## 2017-08-08 DIAGNOSIS — Z1231 Encounter for screening mammogram for malignant neoplasm of breast: Secondary | ICD-10-CM | POA: Diagnosis not present

## 2017-08-08 DIAGNOSIS — Z1239 Encounter for other screening for malignant neoplasm of breast: Secondary | ICD-10-CM

## 2017-08-08 DIAGNOSIS — M8580 Other specified disorders of bone density and structure, unspecified site: Secondary | ICD-10-CM

## 2017-08-08 DIAGNOSIS — M810 Age-related osteoporosis without current pathological fracture: Secondary | ICD-10-CM | POA: Diagnosis not present

## 2017-08-08 DIAGNOSIS — Z780 Asymptomatic menopausal state: Secondary | ICD-10-CM | POA: Diagnosis not present

## 2017-08-19 ENCOUNTER — Encounter: Payer: Self-pay | Admitting: Obstetrics & Gynecology

## 2017-08-19 ENCOUNTER — Other Ambulatory Visit (HOSPITAL_COMMUNITY)
Admission: RE | Admit: 2017-08-19 | Discharge: 2017-08-19 | Disposition: A | Discharge status: Home / Self Care | Payer: Medicare Other | Source: Ambulatory Visit | Attending: Obstetrics & Gynecology | Admitting: Obstetrics & Gynecology

## 2017-08-19 ENCOUNTER — Other Ambulatory Visit: Payer: Self-pay

## 2017-08-19 ENCOUNTER — Ambulatory Visit (INDEPENDENT_AMBULATORY_CARE_PROVIDER_SITE_OTHER): Payer: Medicare Other | Admitting: Obstetrics & Gynecology

## 2017-08-19 VITALS — BP 110/86 | HR 92 | Resp 16 | Ht 61.5 in | Wt 168.0 lb

## 2017-08-19 DIAGNOSIS — Z01419 Encounter for gynecological examination (general) (routine) without abnormal findings: Secondary | ICD-10-CM | POA: Diagnosis not present

## 2017-08-19 DIAGNOSIS — Z0000 Encounter for general adult medical examination without abnormal findings: Secondary | ICD-10-CM

## 2017-08-19 DIAGNOSIS — R5383 Other fatigue: Secondary | ICD-10-CM

## 2017-08-19 DIAGNOSIS — E039 Hypothyroidism, unspecified: Secondary | ICD-10-CM

## 2017-08-19 DIAGNOSIS — M816 Localized osteoporosis [Lequesne]: Secondary | ICD-10-CM

## 2017-08-19 DIAGNOSIS — E7800 Pure hypercholesterolemia, unspecified: Secondary | ICD-10-CM | POA: Diagnosis not present

## 2017-08-19 DIAGNOSIS — Z124 Encounter for screening for malignant neoplasm of cervix: Secondary | ICD-10-CM

## 2017-08-19 DIAGNOSIS — T8543XA Leakage of breast prosthesis and implant, initial encounter: Secondary | ICD-10-CM | POA: Diagnosis not present

## 2017-08-19 LAB — POCT URINALYSIS DIPSTICK
BILIRUBIN UA: NEGATIVE
Glucose, UA: NEGATIVE
KETONES UA: NEGATIVE
Leukocytes, UA: NEGATIVE
Nitrite, UA: NEGATIVE
PH UA: 5 (ref 5.0–8.0)
Protein, UA: NEGATIVE
RBC UA: NEGATIVE
UROBILINOGEN UA: 0.2 U/dL

## 2017-08-19 NOTE — Progress Notes (Signed)
72 y.o. [id number] MarriedCaucasianF here for annual exam.  Last year was quite hard.  Did a tour following the [place]  Broke left shoulder on first day of trip.  Daughter passed in November.  She has five children by three different fathers so pt is keeping the next to the youngest grandchild.    Denies vaginal bleeding.  Having some mild leakage of urine  PCP:  Dr. Drema Dallas.    Patient's last menstrual period was 07/30/1980.          Sexually active: Yes.    The current method of family planning is status post hysterectomy.  Exercising: No.  sometimes at home, walking Smoker:  no  Health Maintenance: Pap:  04/07/15 Neg   03/11/13 Neg. HR HPV:neg  History of abnormal Pap:  yes MMG:  08/08/17 BIRADS2:Benign  Colonoscopy:  12/22/14  F/u 2-3 years  BMD:   08/08/17 Osteoporosis.  Reviewed results with pt.  She does NOT want to be on medication at this time.   TDaP:  2014 Pneumonia vaccine(s):  Done Shingrix:  Zostavax a few years ago. Hep C testing: No Screening Labs: Will come back fasting.  UA: Normal   reports that  has never smoked. she has never used smokeless tobacco. She reports that she drinks alcohol. She reports that she does not use drugs.  Past Medical History:  Diagnosis Date  . Dyspareunia    dryness with intercourse  . Endometriosis 1982   found at [facility]  . GERD (gastroesophageal reflux disease)   . Heart murmur   . History of left shoulder fracture 2017  . Hypothyroidism   . Left ACL tear   . Osteopenia 2009  . Pneumonia   . PONV (postoperative nausea and vomiting)   . Thyroid nodule    followed by Dr Chalmers Cater  . Urinary incontinence    with coughing/sneezing    Past Surgical History:  Procedure Laterality Date  . ABDOMINAL HYSTERECTOMY  1982   TAH--for abnormal paps/endometriosis  . AUGMENTATION MAMMAPLASTY  1977  . COLONOSCOPY WITH PROPOFOL N/A 10/29/2013   Procedure: COLONOSCOPY WITH PROPOFOL;  Surgeon: Jeryl Columbia, MD;  Location: [facility] ENDOSCOPY;   Service: Endoscopy;  Laterality: N/A;  . COLONOSCOPY WITH PROPOFOL N/A 12/22/2014   Procedure: COLONOSCOPY WITH PROPOFOL;  Surgeon: Clarene Essex, MD;  Location: [facility] ENDOSCOPY;  Service: Endoscopy;  Laterality: N/A;  ultra slim scope  . COSMETIC SURGERY  11/2011   face lift,neck lift, eye lift--Dr. Stephanie Coup  . dilatation and curettage  1982  . HOT HEMOSTASIS N/A 12/22/2014   Procedure: HOT HEMOSTASIS (ARGON PLASMA COAGULATION/BICAP);  Surgeon: Clarene Essex, MD;  Location: [facility] ENDOSCOPY;  Service: Endoscopy;  Laterality: N/A;  . TONSILLECTOMY      Current Outpatient Medications  Medication Sig Dispense Refill  . aspirin 81 MG tablet Take 81 mg by mouth daily.    . calcium carbonate (TUMS - DOSED IN MG ELEMENTAL CALCIUM) 500 MG chewable tablet Chew 1 tablet by mouth daily as needed for indigestion or heartburn.     . Cholecalciferol (VITAMIN D3) 1000 UNITS CAPS Take 1,000 Units by mouth daily.    . fish oil-omega-3 fatty acids 1000 MG capsule Take 1 g by mouth daily.     Marland Kitchen levothyroxine (SYNTHROID, LEVOTHROID) 50 MCG tablet TK 1 T PO QD  4  . loratadine (CLARITIN) 10 MG tablet Take 10 mg by mouth daily as needed for allergies.     . Multiple Vitamin (MULTIVITAMIN) capsule Take 1 capsule by  mouth daily.     No current facility-administered medications for this visit.     Family History  Problem Relation Age of Onset  . Cancer Mother   . Cervical cancer Mother   . Diabetes Father        adult onset  . Stroke Father   . Thyroid disease Sister        hyperthyroid  . Asthma Sister   . Thyroid disease Sister        hypothyroid  . Cancer Maternal Grandmother   . Stomach cancer Maternal Grandmother     ROS:  Pertinent items are noted in HPI.  Otherwise, a comprehensive ROS was negative.  Exam:   BP 110/86 (BP Location: Left Arm, Patient Position: Sitting, Cuff Size: Large)   Pulse 92   Resp 16   Ht 5' 1.5" (1.562 m)   Wt 168 lb (76.2 kg)   LMP 07/30/1980   BMI 31.23 kg/m   Weight change:  +2#  Height: 5' 1.5" (156.2 cm)  Ht Readings from Last 3 Encounters:  08/19/17 5' 1.5" (1.562 m)  04/07/15 5' 2.25" (1.581 m)  03/05/14 5' 1.75" (1.568 m)    General appearance: alert, cooperative and appears stated age Head: Normocephalic, without obvious abnormality, atraumatic Neck: no adenopathy, supple, symmetrical, trachea midline and thyroid normal to inspection and palpation Lungs: clear to auscultation bilaterally Breasts: normal appearance, no masses or tenderness Heart: regular rate and rhythm Abdomen: soft, non-tender; bowel sounds normal; no masses,  no organomegaly Extremities: extremities normal, atraumatic, no cyanosis or edema Skin: Skin color, texture, turgor normal. No rashes or lesions Lymph nodes: Cervical, supraclavicular, and axillary nodes normal. No abnormal inguinal nodes palpated Neurologic: Grossly normal   Pelvic: External genitalia:  no lesions              Urethra:  normal appearing urethra with no masses, tenderness or lesions              Bartholins and Skenes: normal                 Vagina: normal appearing vagina with normal color and discharge, no lesions              Cervix: absent              Pap taken: Yes.   Bimanual Exam:  Uterus:  uterus absent              Adnexa: no mass, fullness, tenderness               Rectovaginal: Confirms               Anus:  normal sphincter tone, no lesions  Chaperone was present for exam.  A:  Well Woman with normal exam PMP, no HRT Vaginal atrophic changes Bilateral implant ruptures on MMG Hypothyroidism Early osteoporosis.  Declines medication at this time.  P:   Mammogram guidelines reviewed. pap smear obtained today Referral to Dr. Stephanie Coup for likely implant ruptures Lab work done with Dr. Drema Dallas Pt knows colonoscopy is due.  Will help pt schedule appt Blood work will be obtained today:  CBC, CMP, Vit D, Lipids Repeat BMD in two years.  Declines medication treatment right now. Return annually or  prn

## 2017-08-19 NOTE — Progress Notes (Signed)
Scheduled patient while in office to see Dr.Magod on 09/04/2017 at 9:45 am. Patient is agreeable to date and time.

## 2017-08-20 ENCOUNTER — Telehealth: Payer: Self-pay | Admitting: Obstetrics & Gynecology

## 2017-08-20 LAB — COMPREHENSIVE METABOLIC PANEL
ALBUMIN: 4.3 g/dL (ref 3.5–4.8)
ALK PHOS: 132 IU/L — AB (ref 39–117)
ALT: 49 IU/L — ABNORMAL HIGH (ref 0–32)
AST: 36 IU/L (ref 0–40)
Albumin/Globulin Ratio: 1.7 (ref 1.2–2.2)
BUN / CREAT RATIO: 18 (ref 12–28)
BUN: 15 mg/dL (ref 8–27)
Bilirubin Total: 0.7 mg/dL (ref 0.0–1.2)
CALCIUM: 10.4 mg/dL — AB (ref 8.7–10.3)
CO2: 25 mmol/L (ref 20–29)
CREATININE: 0.83 mg/dL (ref 0.57–1.00)
Chloride: 105 mmol/L (ref 96–106)
GFR, EST AFRICAN AMERICAN: 81 mL/min/{1.73_m2} (ref >59)
GFR, EST NON AFRICAN AMERICAN: 71 mL/min/{1.73_m2} (ref >59)
GLUCOSE: 85 mg/dL (ref 65–99)
Globulin, Total: 2.5 g/dL (ref 1.5–4.5)
Potassium: 4.7 mmol/L (ref 3.5–5.2)
Sodium: 140 mmol/L (ref 134–144)
TOTAL PROTEIN: 6.8 g/dL (ref 6.0–8.5)

## 2017-08-20 LAB — LIPID PANEL
CHOLESTEROL TOTAL: 172 mg/dL (ref 100–199)
Chol/HDL Ratio: 4.1 ratio (ref 0.0–4.4)
HDL: 42 mg/dL (ref >39)
LDL CALC: 103 mg/dL — AB (ref 0–99)
Triglycerides: 133 mg/dL (ref 0–149)
VLDL CHOLESTEROL CAL: 27 mg/dL (ref 5–40)

## 2017-08-20 LAB — CBC
HEMOGLOBIN: 16.3 g/dL — AB (ref 11.1–15.9)
Hematocrit: 47.8 % — ABNORMAL HIGH (ref 34.0–46.6)
MCH: 29.4 pg (ref 26.6–33.0)
MCHC: 34.1 g/dL (ref 31.5–35.7)
MCV: 86 fL (ref 79–97)
Platelets: 299 10*3/uL (ref 150–379)
RBC: 5.55 x10E6/uL — AB (ref 3.77–5.28)
RDW: 13.9 % (ref 12.3–15.4)
WBC: 6.2 10*3/uL (ref 3.4–10.8)

## 2017-08-20 LAB — VITAMIN D 25 HYDROXY (VIT D DEFICIENCY, FRACTURES): Vit D, 25-Hydroxy: 31.4 ng/mL (ref 30.0–100.0)

## 2017-08-20 NOTE — Telephone Encounter (Signed)
Call placed to patient to review referral to Dr Sallye Lat. Dr Lowell Bouton office advised they do not accept insurance and if the patient is agreeable to out of pocket cost, the patient can call their office to schedule.  I have spoken with the patient to convey this information and she states she will call their office to obtain more information in regards to cost and then will decide how she wants to proceed. I have requested patient to call me to advise how she will proceed. Patient is agreeable, but adds, she is leaving to go out of town and states it will be two weeks before she will be making any decisions.   Forwarding to Dr Sabra Heck for review

## 2017-08-21 LAB — CYTOLOGY - PAP: Diagnosis: NEGATIVE

## 2017-08-26 ENCOUNTER — Telehealth: Payer: Self-pay | Admitting: *Deleted

## 2017-08-26 NOTE — Telephone Encounter (Signed)
-----   Message from Megan Salon, MD sent at 08/25/2017  8:02 AM EST ----- Please let pt know her CBC showed RBCs to be elevated.  This increases her risk for a clot/stroke so she needs to make sure she is taking her ASA every day.  Needs hematology referral.  Please place to Dr. Marin Olp unless pt has another provider she would desire.  Calcium level was 0.1 above normal and she had one elevated liver enzyme.  Do not take tylenol or drink any alcohol and repeat CMP in one month.  Cholesterol was fine and Vit D level was fine.  Continue the OTC calcium and VIt D.  As calcium level was just 0.1 above normal, should be fine to stay on these.  Pap was normal.  02 recall.

## 2017-08-26 NOTE — Telephone Encounter (Signed)
LM for pt to call back.

## 2017-08-27 NOTE — Telephone Encounter (Signed)
LM for pt to call back. Second attempt  

## 2017-08-29 NOTE — Telephone Encounter (Signed)
Third attempt to reach patient. Left voicemail to call back.   Unable to reach letter sent to patient.

## 2017-09-02 ENCOUNTER — Telehealth: Payer: Self-pay

## 2017-09-02 DIAGNOSIS — R718 Other abnormality of red blood cells: Secondary | ICD-10-CM

## 2017-09-02 NOTE — Telephone Encounter (Signed)
Patient received letter to call regarding results. Results given. Patient verbalizes understanding. Referral to Dr.Ennever placed. Aware our referrals coordinator will work with Dr.Ennever's office to get this scheduled and she will be notified of appointment date and time. Repeat lab testing scheduled for 09/30/2017 at 10:30 am. Patient is agreeable to date and time. 02 recall placed.  Notes recorded by Megan Salon, MD on 08/25/2017 at 8:02 AM EST Please let pt know her CBC showed RBCs to be elevated. This increases her risk for a clot/stroke so she needs to make sure she is taking her ASA every day. Needs hematology referral. Please place to Dr. Marin Olp unless pt has another provider she would desire. Calcium level was 0.1 above normal and she had one elevated liver enzyme. Do not take tylenol or drink any alcohol and repeat CMP in one month. Cholesterol was fine and Vit D level was fine. Continue the OTC calcium and VIt D. As calcium level was just 0.1 above normal, should be fine to stay on these. Pap was normal. 02 recall.  Routing to provider for final review. Patient agreeable to disposition. Will close encounter.

## 2017-09-17 ENCOUNTER — Encounter: Payer: Self-pay | Admitting: Obstetrics & Gynecology

## 2017-09-17 ENCOUNTER — Telehealth: Payer: Self-pay | Admitting: Obstetrics & Gynecology

## 2017-09-17 NOTE — Telephone Encounter (Signed)
I spoke with Ms. Heather Brown regarding her referral for plastic surgery. She states she does not want to proceed with the referral right now. She has an appointment with a hematologist in March 2019. Forwarding to provider for finally review.

## 2017-09-30 ENCOUNTER — Other Ambulatory Visit: Payer: Medicare Other

## 2017-09-30 DIAGNOSIS — Z86010 Personal history of colonic polyps: Secondary | ICD-10-CM | POA: Diagnosis not present

## 2017-09-30 DIAGNOSIS — K635 Polyp of colon: Secondary | ICD-10-CM | POA: Diagnosis not present

## 2017-09-30 DIAGNOSIS — Z9889 Other specified postprocedural states: Secondary | ICD-10-CM | POA: Diagnosis not present

## 2017-10-01 ENCOUNTER — Inpatient Hospital Stay: Payer: Medicare Other

## 2017-10-01 ENCOUNTER — Other Ambulatory Visit: Payer: Self-pay

## 2017-10-01 ENCOUNTER — Inpatient Hospital Stay: Payer: Medicare Other | Attending: Hematology & Oncology | Admitting: Hematology & Oncology

## 2017-10-01 VITALS — BP 144/66 | HR 73 | Temp 98.3°F | Resp 18 | Wt 166.2 lb

## 2017-10-01 DIAGNOSIS — D45 Polycythemia vera: Secondary | ICD-10-CM

## 2017-10-01 DIAGNOSIS — E039 Hypothyroidism, unspecified: Secondary | ICD-10-CM | POA: Diagnosis not present

## 2017-10-01 DIAGNOSIS — M8580 Other specified disorders of bone density and structure, unspecified site: Secondary | ICD-10-CM | POA: Diagnosis not present

## 2017-10-01 DIAGNOSIS — R718 Other abnormality of red blood cells: Secondary | ICD-10-CM | POA: Diagnosis not present

## 2017-10-01 DIAGNOSIS — D751 Secondary polycythemia: Secondary | ICD-10-CM

## 2017-10-01 DIAGNOSIS — K219 Gastro-esophageal reflux disease without esophagitis: Secondary | ICD-10-CM | POA: Diagnosis not present

## 2017-10-01 LAB — CMP (CANCER CENTER ONLY)
ALK PHOS: 125 U/L (ref 40–150)
ALT: 47 U/L (ref 0–55)
ANION GAP: 8 (ref 3–11)
AST: 36 U/L — ABNORMAL HIGH (ref 5–34)
Albumin: 3.7 g/dL (ref 3.5–5.0)
BILIRUBIN TOTAL: 1.6 mg/dL — AB (ref 0.2–1.2)
BUN: 12 mg/dL (ref 7–26)
CALCIUM: 10.5 mg/dL — AB (ref 8.4–10.4)
CO2: 25 mmol/L (ref 22–29)
Chloride: 108 mmol/L (ref 98–109)
Creatinine: 0.77 mg/dL (ref 0.60–1.10)
GFR, Estimated: >60 mL/min (ref >60)
GLUCOSE: 90 mg/dL (ref 70–140)
Potassium: 4 mmol/L (ref 3.5–5.1)
Sodium: 141 mmol/L (ref 136–145)
TOTAL PROTEIN: 7 g/dL (ref 6.4–8.3)

## 2017-10-01 LAB — CBC WITH DIFFERENTIAL (CANCER CENTER ONLY)
Basophils Absolute: 0 10*3/uL (ref 0.0–0.1)
Basophils Relative: 1 %
EOS ABS: 0.1 10*3/uL (ref 0.0–0.5)
Eosinophils Relative: 2 %
HCT: 46.9 % — ABNORMAL HIGH (ref 34.8–46.6)
HEMOGLOBIN: 15.8 g/dL (ref 11.6–15.9)
LYMPHS ABS: 1.3 10*3/uL (ref 0.9–3.3)
Lymphocytes Relative: 32 %
MCH: 29.5 pg (ref 26.0–34.0)
MCHC: 33.7 g/dL (ref 32.0–36.0)
MCV: 87.5 fL (ref 81.0–101.0)
MONOS PCT: 12 %
Monocytes Absolute: 0.5 10*3/uL (ref 0.1–0.9)
NEUTROS PCT: 53 %
Neutro Abs: 2 10*3/uL (ref 1.5–6.5)
Platelet Count: 267 10*3/uL (ref 145–400)
RBC: 5.36 MIL/uL — ABNORMAL HIGH (ref 3.70–5.32)
RDW: 13.6 % (ref 11.1–15.7)
WBC Count: 3.9 10*3/uL (ref 3.9–10.0)

## 2017-10-01 LAB — IRON AND TIBC
Iron: 113 ug/dL (ref 28–170)
Saturation Ratios: 30 % (ref 10.4–31.8)
TIBC: 374 ug/dL (ref 250–450)
UIBC: 261 ug/dL

## 2017-10-01 LAB — RETICULOCYTES
RBC.: 5.31 MIL/uL (ref 3.70–5.45)
RETIC CT PCT: 0.9 % (ref 0.7–2.1)
Retic Count, Absolute: 47.8 10*3/uL (ref 33.7–90.7)

## 2017-10-01 LAB — LACTATE DEHYDROGENASE: LDH: 185 U/L (ref 125–245)

## 2017-10-01 LAB — VITAMIN B12: Vitamin B-12: 402 pg/mL (ref 180–914)

## 2017-10-01 LAB — FERRITIN: Ferritin: 50 ng/mL (ref 11–307)

## 2017-10-01 MED ORDER — TRIAMCINOLONE ACETONIDE 0.5 % EX OINT: 1.0000 "application " | TOPICAL_OINTMENT | Freq: Two times a day (BID) | CUTANEOUS | 2 refills | Status: DC

## 2017-10-01 NOTE — Progress Notes (Signed)
Referral MD  Reason for Referral: Erythrocytosis  Chief Complaint  Patient presents with  . New Patient (Initial Visit)  : I have too many red blood cells.  HPI: Heather Brown is a very charming 72 year old white female.  She is originally from [place].  She has been through a lot of stress.  1 of her daughters unfortunately died of a drug overdose in July 22, 2023.  This is been incredibly difficult for her and her family.  They are now taking care of their grandson.  She is followed by Dr. Edwinna Areola of OB/GYN.  Dr. Sabra Heck has noted that Heather Brown has had some element of erythrocytosis.  Going back to August 2014, a CBC was done which showed a white cell count 4.1.  Hemoglobin 15.6.  Hematocrit 46.  Platelet count 265,000.  The MCV was 85.  In January of this year, another CBC was done.  This showed a white cell count 6.2.  Hemoglobin 16.3.  Hematocrit 47.8.  MCV was 86.  Platelet count was 299,000.  Because of this, Dr. Sabra Heck felt that a hematologic evaluation was indicated.  As such, she kindly referred Heather Brown to the [facility].  Heather Brown is doing okay.  Again she is under a lot of stress.  She has never smoked.  She has never really drank.  There is no history in the family of any blood problems.  She has had a hysterectomy.  She has had her tonsils out.  She has had no problems with weight loss or weight gain.  She does have some hypothyroidism.  She is on Synthroid.  She does have some osteopenia.  She did have a tear of her left ACL.  There is a history of cancer on her mother's side of the family.  She denies any type of itching after a shower.  There is no change in bowel or bladder habits.  She has had a colonoscopy yesterday.  Apparently 2 polyps were found.  There is no cough or shortness of breath.  She is had no rashes.  Her appetite is okay.  She is not a vegetarian.  Overall, her performance status is ECOG 0.   Past Medical  History:  Diagnosis Date  . Dyspareunia    dryness with intercourse  . Endometriosis 1982   found at [facility]  . GERD (gastroesophageal reflux disease)   . Heart murmur   . History of left shoulder fracture 2017  . Hypothyroidism   . Left ACL tear   . Osteopenia 2009  . Pneumonia   . PONV (postoperative nausea and vomiting)   . Thyroid nodule    followed by Dr Chalmers Cater  . Urinary incontinence    with coughing/sneezing  :  Past Surgical History:  Procedure Laterality Date  . ABDOMINAL HYSTERECTOMY  1982   [facility]--for abnormal paps/endometriosis  . AUGMENTATION MAMMAPLASTY  1977  . COLONOSCOPY WITH PROPOFOL N/A 10/29/2013   Procedure: COLONOSCOPY WITH PROPOFOL;  Surgeon: Jeryl Columbia, MD;  Location: [facility] ENDOSCOPY;  Service: Endoscopy;  Laterality: N/A;  . COLONOSCOPY WITH PROPOFOL N/A 12/22/2014   Procedure: COLONOSCOPY WITH PROPOFOL;  Surgeon: Clarene Essex, MD;  Location: [facility] ENDOSCOPY;  Service: Endoscopy;  Laterality: N/A;  ultra slim scope  . COSMETIC SURGERY  11/2011   face lift,neck lift, eye lift--Dr. Stephanie Coup  . dilatation and curettage  1982  . HOT HEMOSTASIS N/A 12/22/2014   Procedure: HOT HEMOSTASIS (ARGON PLASMA COAGULATION/BICAP);  Surgeon: Clarene Essex, MD;  Location: [facility] ENDOSCOPY;  Service: Endoscopy;  Laterality: N/A;  . TONSILLECTOMY    :   Current Outpatient Medications:  .  aspirin 81 MG tablet, Take 81 mg by mouth daily., Disp: , Rfl:  .  Biotin 2500 MCG CAPS, Take by mouth. Three times weekly, Disp: , Rfl:  .  CALCIUM CARBONATE ANTACID PO, Take 1,000 mg by mouth daily as needed for indigestion or heartburn. , Disp: , Rfl:  .  Cholecalciferol (VITAMIN D3) 1000 UNITS CAPS, Take 1,000 Units by mouth daily., Disp: , Rfl:  .  fish oil-omega-3 fatty acids 1000 MG capsule, Take 1 g by mouth daily. With 360 mg omega-3, Disp: , Rfl:  .  levothyroxine (SYNTHROID, LEVOTHROID) 50 MCG tablet, TK 1 T PO QD, Disp: , Rfl: 4 .  loratadine (CLARITIN) 10 MG tablet, Take 10 mg by mouth daily as  needed for allergies. , Disp: , Rfl:  .  Multiple Vitamin (MULTIVITAMIN) capsule, Take 1 capsule by mouth daily., Disp: , Rfl: :  :  Allergies  Allergen Reactions  . Sulfa Antibiotics Hives  :  Family History  Problem Relation Age of Onset  . Cancer Mother   . Cervical cancer Mother   . Diabetes Father        adult onset  . Stroke Father   . Thyroid disease Sister        hyperthyroid  . Asthma Sister   . Thyroid disease Sister        hypothyroid  . Cancer Maternal Grandmother   . Stomach cancer Maternal Grandmother   :  Social History   Socioeconomic History  . Marital status: Married    Spouse name: Not on file  . Number of children: Not on file  . Years of education: Not on file  . Highest education level: Not on file  Social Needs  . Financial resource strain: Not on file  . Food insecurity - worry: Not on file  . Food insecurity - inability: Not on file  . Transportation needs - medical: Not on file  . Transportation needs - non-medical: Not on file  Occupational History  . Not on file  Tobacco Use  . Smoking status: Never Smoker  . Smokeless tobacco: Never Used  Substance and Sexual Activity  . Alcohol use: Yes    Alcohol/week: 0.0 oz    Comment: wine 1 glass monthly, if that  . Drug use: No  . Sexual activity: Yes    Partners: Male    Birth control/protection: Surgical    Comment: TAH  Other Topics Concern  . Not on file  Social History Narrative  . Not on file  :  Review of Systems  Constitutional: Negative.   HENT: Negative.   Eyes: Negative.   Respiratory: Negative.   Cardiovascular: Negative.   Gastrointestinal: Negative.   Genitourinary: Negative.   Musculoskeletal: Negative.   Skin: Negative.   Neurological: Negative.   Endo/Heme/Allergies: Negative.   Psychiatric/Behavioral: Negative.      Exam: Headaches.  She is been having some problems with swallowing.  Well-developed and well-nourished white female in no obvious distress.   Vital signs show temperature of 98.3.  Pulse 73.  Blood pressure 144/66.  Weight is 166 pounds.Head neck exam shows no ocular or oral lesions.  She has no conjunctival inflammation.  There is no facial plethora.  She has no adenopathy in the neck.  Thyroid is nonpalpable.  Lungs are clear bilaterally.  Cardiac exam regular rate and rhythm with no murmurs, rubs or  bruits.  Abdomen is soft.  She has good bowel sounds.  There is no fluid wave.  There is no palpable liver or spleen tip.  Back exam shows no tenderness over the spine, ribs or hips.  She does have a little bit of a rash in the mid back.  I put some steroid cream on there we had in the office.  Extremities shows no clubbing, cyanosis or edema.  Neurological exam shows no focal neurological deficits.  Skin exam shows this macular type rash on her lower back in the midline.     @IPVITALS @   Recent Labs    10/01/17 1042  WBC 3.9  HCT 46.9*  PLT 267   No results for input(s): NA, K, CL, CO2, GLUCOSE, BUN, CREATININE, CALCIUM in the last 72 hours.  Blood smear review: Normochromic and normocytic population of red blood cells.  She has no nucleated red blood cells.  There are no teardrop cells.  She has no inclusion bodies.  There is no rouleaux formation.  White blood cells appear normal in morphology maturation.  She has no hypersegmented polys.  There is no immature myeloid or lymphoid forms.  Platelets are adequate in number and size.  Platelets are well granulated.  Pathology: None    Assessment and Plan: Heather Brown is a very nice 72 year old white female.  She has minimal erythrocytosis.  I would be shocked if she has a myeloproliferative disorder.  I would be shocked if she had polycythemia.  We are sending off the requisite tests for polycythemia.  It is possible that all the stress she is under might be the cause of the elevated red cell count.  Again, her red cell count is minimally elevated.  I agree with her being on the  aspirin.  I just would not believe that the erythrocytosis is going to be a problem for her.  I spent about 45 minutes with her.  Over 50% of time was spent face-to-face.  I reviewed her lab work with her.  I explained my recommendations.  For right now, I just do not think we have to see her back in the office.  If we find that her blood test are suspicious for polycythemia, then we will get her back for a follow-up.  Again she is so nice.

## 2017-10-02 ENCOUNTER — Other Ambulatory Visit (INDEPENDENT_AMBULATORY_CARE_PROVIDER_SITE_OTHER): Payer: Medicare Other

## 2017-10-02 DIAGNOSIS — K635 Polyp of colon: Secondary | ICD-10-CM | POA: Diagnosis not present

## 2017-10-02 DIAGNOSIS — R5383 Other fatigue: Secondary | ICD-10-CM

## 2017-10-02 LAB — ERYTHROPOIETIN: ERYTHROPOIETIN: 5.7 m[IU]/mL (ref 2.6–18.5)

## 2017-10-02 LAB — TECHNOLOGIST SMEAR REVIEW

## 2017-10-10 ENCOUNTER — Telehealth: Payer: Self-pay

## 2017-10-10 NOTE — Telephone Encounter (Signed)
-----   Message from Megan Salon, MD sent at 10/10/2017  6:38 AM EDT ----- Regarding: RE: Labs Could you please let her know that one of the things I am watching is her calcium level.  It was 10.4 (normal upper end of range is 10.3) and the repeat with Dr. Marin Olp was 10.5.    She needs a repeat calcium with intact PTH level in six months.  Ok to schedule if she will do that.  Ok for her to review this with her PCP as well.  I have not placed any orders yet.  Thanks.  Vinnie Level  ----- Message ----- From: Gwendlyn Deutscher, RN Sent: 10/02/2017  10:26 AM To: Megan Salon, MD Subject: Labs                                           Dr.Miller,  Patient returned to the office for repeat CMP due to elevated liver enzyme on 08/19/2017. Patient was referred to Dr.Ennever and seen yesterday. Patient had a CMP done at Dr.Ennever's office. Lab was not repeated in our office today as a result.  Verline Lema

## 2017-10-10 NOTE — Telephone Encounter (Signed)
Spoke with patient. Advised of message as seen below from [place]. Patient verbalizes understanding. [place] the airport [place] and would like to call back to schedule 6 month lab check.  Routing to provider for final review. Patient agreeable to disposition. Will close encounter.

## 2017-10-10 NOTE — Telephone Encounter (Signed)
Left message to call Kaitlyn at [phone]. 

## 2017-10-21 ENCOUNTER — Other Ambulatory Visit: Payer: Self-pay | Admitting: Hematology & Oncology

## 2017-10-21 DIAGNOSIS — D751 Secondary polycythemia: Secondary | ICD-10-CM

## 2017-10-21 NOTE — Progress Notes (Signed)
I left Ms. Stanfill a message on her answering machine.  I talked to her about the lab results that she had a few weeks ago.  Her erythropoietin level is very low for her level of hemoglobin.  As such, this is the only abnormal test that I can see for her to have polycythemia.  She does not have the JAK2 mutation.  We did send off the myeloproliferative panel.  It did come back with 1 mutation which is unclear as to whether or not this is significant for polycythemia.  I do want to get her back and recheck her labs in 6 weeks.  We will call her to make the appointment.  Lattie Haw, MD

## 2017-11-15 LAB — JAK2 (INCLUDING V617F AND EXON 12), MPL,& CALR-NEXT GEN SEQ

## 2017-11-26 ENCOUNTER — Encounter: Payer: Self-pay | Admitting: Hematology & Oncology

## 2017-12-02 ENCOUNTER — Other Ambulatory Visit: Payer: Self-pay

## 2017-12-02 ENCOUNTER — Other Ambulatory Visit: Payer: Self-pay | Admitting: *Deleted

## 2017-12-02 ENCOUNTER — Inpatient Hospital Stay: Payer: Medicare Other

## 2017-12-02 ENCOUNTER — Encounter: Payer: Self-pay | Admitting: Hematology & Oncology

## 2017-12-02 ENCOUNTER — Inpatient Hospital Stay: Payer: Medicare Other | Attending: Hematology & Oncology | Admitting: Hematology & Oncology

## 2017-12-02 ENCOUNTER — Other Ambulatory Visit: Payer: Self-pay | Admitting: Family

## 2017-12-02 VITALS — BP 129/62 | HR 75

## 2017-12-02 DIAGNOSIS — D45 Polycythemia vera: Secondary | ICD-10-CM

## 2017-12-02 DIAGNOSIS — Z7982 Long term (current) use of aspirin: Secondary | ICD-10-CM | POA: Insufficient documentation

## 2017-12-02 DIAGNOSIS — D751 Secondary polycythemia: Secondary | ICD-10-CM

## 2017-12-02 HISTORY — DX: Polycythemia vera: D45

## 2017-12-02 LAB — CMP (CANCER CENTER ONLY)
ALBUMIN: 3.7 g/dL (ref 3.5–5.0)
ALT: 56 U/L — ABNORMAL HIGH (ref 0–55)
AST: 40 U/L — ABNORMAL HIGH (ref 5–34)
Alkaline Phosphatase: 136 U/L (ref 40–150)
Anion gap: 9 (ref 3–11)
BUN: 17 mg/dL (ref 7–26)
CALCIUM: 10.6 mg/dL — AB (ref 8.4–10.4)
CHLORIDE: 109 mmol/L (ref 98–109)
CO2: 27 mmol/L (ref 22–29)
Creatinine: 0.82 mg/dL (ref 0.60–1.10)
GFR, Estimated: >60 mL/min (ref >60)
GLUCOSE: 111 mg/dL (ref 70–140)
POTASSIUM: 4.2 mmol/L (ref 3.5–5.1)
SODIUM: 145 mmol/L (ref 136–145)
Total Bilirubin: 1 mg/dL (ref 0.2–1.2)
Total Protein: 7 g/dL (ref 6.4–8.3)

## 2017-12-02 LAB — CBC WITH DIFFERENTIAL (CANCER CENTER ONLY)
BASOS PCT: 1 %
Basophils Absolute: 0 10*3/uL (ref 0.0–0.1)
Eosinophils Absolute: 0.1 10*3/uL (ref 0.0–0.5)
Eosinophils Relative: 2 %
HEMATOCRIT: 47.7 % — AB (ref 34.8–46.6)
HEMOGLOBIN: 15.8 g/dL (ref 11.6–15.9)
LYMPHS ABS: 1.6 10*3/uL (ref 0.9–3.3)
Lymphocytes Relative: 39 %
MCH: 29.3 pg (ref 26.0–34.0)
MCHC: 33.1 g/dL (ref 32.0–36.0)
MCV: 88.5 fL (ref 81.0–101.0)
Monocytes Absolute: 0.5 10*3/uL (ref 0.1–0.9)
Monocytes Relative: 13 %
NEUTROS ABS: 1.8 10*3/uL (ref 1.5–6.5)
NEUTROS PCT: 45 %
Platelet Count: 231 10*3/uL (ref 145–400)
RBC: 5.39 MIL/uL — ABNORMAL HIGH (ref 3.70–5.32)
RDW: 13.9 % (ref 11.1–15.7)
WBC Count: 4 10*3/uL (ref 3.9–10.0)

## 2017-12-02 MED ORDER — SODIUM CHLORIDE 0.9 % IV SOLN
INTRAVENOUS | Status: DC
Start: 2017-12-02 — End: 2017-12-02
  Administered 2017-12-02: 14:00:00 via INTRAVENOUS

## 2017-12-02 NOTE — Patient Instructions (Addendum)
Polycythemia Vera Polycythemia vera (PV), or myeloproliferative disease, is a form of blood cancer in which the bone marrow makes too many (overproduces) red blood cells. The bone marrow may also make too many clotting cells (platelets) and white blood cells. Bone marrow is the spongy center of bones where blood cells are produced. Sometimes, there may be an overproduction of blood cells in the liver and spleen, causing those organs to become enlarged. Additionally, people who have PV are at a higher risk for stroke or heart attack because their blood may clot more easily. PV is a long-term disease. What are the causes? Almost all people who have PV have an abnormal gene (genetic mutation) that causes changes in the way that the bone marrow makes blood cells. This gene, which is called JAK2, is not passed along from parent to child (is not hereditary). It is not known what triggers the genetic mutation that causes the body to produce too many red blood cells. What increases the risk? This condition is more likely to develop in:  Males.  People who are 10 years of age or older.  What are the signs or symptoms? You may not have any symptoms in the early stage of PV. When symptoms develop, they may include:  Shortness of breath.  Dizziness.  Hot and flushed skin.  Itchy skin.  Sweats, especially night sweats.  Headache.  Tiredness.  Ringing in the ears.  Blurred vision or blind spots.  Bone pain.  Weight loss.  Fever.  Blood-tinged vomit or bowel movements.  How is this diagnosed? This condition may be diagnosed during a routine physical exam if you have a blood test called a complete blood count (CBC). Your health care provider also may suspect PV if you have symptoms. During the physical exam, your provider may find that you have an enlarged liver or spleen. You may also have tests to confirm the diagnosis. These may include:  A procedure to remove a sample of bone marrow  for testing (bone marrow biopsy).  Blood tests to check for: ? The JAK2 gene. ? Low levels of a hormone that helps to regulate blood production (erythropoietin).  How is this treated? There is no cure for PV, but treatment can help to control the disease. There are several types of treatment. No single treatment works for everyone. You will need to work with a blood cancer specialist (hematologist) to find the treatment that is best for you. Options include:  Periodically having some blood removed with a needle (drawn) to lower the number of red blood cells (phlebotomy).  Medicine. Your health care provider may recommend: ? Low-dose aspirin to lower your risk for blood clots. ? A medicine to reduce red blood cell production (hydroxyurea). ? A medicine to lower the number of red blood cells (interferon). ? A medicine that slows down the effects of JAK2 (ruxolitinib).  Follow these instructions at home:  Take over-the-counter and prescription medicines only as told by your health care provider.  Return to your normal activities as told by your health care provider. Ask your health care provider what activities are safe for you.  Do not use tobacco products, including cigarettes, chewing tobacco, or e-cigarettes. If you need help quitting, ask your health care provider.  Keep all follow-up visits as told by your health care provider. This is important. Contact a health care provider if:  You have side effects from your medicines.  Your symptoms change or get worse at home.  You have  blood in your stool or you vomit blood. Get help right away if:  You have sudden and severe pain in your abdomen.  You have chest pain or difficulty breathing.  You have signs of stroke, such as: ? Sudden numbness. ? Weakness of your face or arm. ? Confusion. ? Difficulty speaking or understanding speech. These symptoms may represent a serious problem that is an emergency. Do not wait to see if  the symptoms will go away. Get medical help right away. Call your local emergency services (911 in the [place] Do not drive yourself to the hospital. This information is not intended to replace advice given to you by your health care provider. Make sure you discuss any questions you have with your health care provider. Document Released: 04/10/2001 Document Revised: 12/22/2015 Document Reviewed: 01/26/2015 Elsevier Interactive Patient Education  2018 [employer]. Therapeutic Phlebotomy, Care After Refer to this sheet in the next few weeks. These instructions provide you with information about caring for yourself after your procedure. Your health care provider may also give you more specific instructions. Your treatment has been planned according to current medical practices, but problems sometimes occur. Call your health care provider if you have any problems or questions after your procedure. What can I expect after the procedure? After the procedure, it is common to have:  Light-headedness or dizziness. You may feel faint.  Nausea.  Tiredness.  Follow these instructions at home: Activity  Return to your normal activities as directed by your health care provider. Most people can go back to their normal activities right away.  Avoid strenuous physical activity and heavy lifting or pulling for about 5 hours after the procedure. Do not lift anything that is heavier than 10 lb (4.5 kg).  Athletes should avoid strenuous exercise for at least 12 hours.  Change positions slowly for the remainder of the day. This will help to prevent light-headedness or fainting.  If you feel light-headed, lie down until the feeling goes away. Eating and drinking  Be sure to eat well-balanced meals for the next 24 hours.  Drink enough fluid to keep your urine clear or pale yellow.  Avoid drinking alcohol on the day that you had the procedure. Care of the Needle Insertion Site  Keep your bandage dry. You  can remove the bandage after about 5 hours or as directed by your health care provider.  If you have bleeding from the needle insertion site, elevate your arm and press firmly on the site until the bleeding stops.  If you have bruising at the site, apply ice to the area: ? Put ice in a plastic bag. ? Place a towel between your skin and the bag. ? Leave the ice on for 20 minutes, 2-3 times a day for the first 24 hours.  If the swelling does not go away after 24 hours, apply a warm, moist washcloth to the area for 20 minutes, 2-3 times a day. General instructions  Avoid smoking for at least 30 minutes after the procedure.  Keep all follow-up visits as directed by your health care provider. It is important to continue with further therapeutic phlebotomy treatments as directed. Contact a health care provider if:  You have redness, swelling, or pain at the needle insertion site.  You have fluid, blood, or pus coming from the needle insertion site.  You feel light-headed, dizzy, or nauseated, and the feeling does not go away.  You notice new bruising at the needle insertion site.  You feel  weaker than normal.  You have a fever or chills. Get help right away if:  You have severe nausea or vomiting.  You have chest pain.  You have trouble breathing. This information is not intended to replace advice given to you by your health care provider. Make sure you discuss any questions you have with your health care provider. Document Released: 12/18/2010 Document Revised: 03/17/2016 Document Reviewed: 07/12/2014 Elsevier Interactive Patient Education  [employer].

## 2017-12-02 NOTE — Progress Notes (Signed)
Hematology and Oncology Follow Up Visit  Heather Brown [id number] 1946-07-28 72 y.o. 12/02/2017   Principle Diagnosis:   Polycythemia vera -- DNMT3A (+),  Epo = 5.7  Current Therapy:    Phlebotomy to maintain hematocrit below 45%  Aspirin 81 mg p.o. daily     Interim History:  Heather Brown is back for follow-up.  At this is her second office visit.  We first saw her back in early March.  At that time, we did send off the genetic assay for polycythemia vera.  Heather Brown is negative for JAK2.  However, Heather Brown is positive for DNMT3A.  This has been associated with polycythemia vera.  Heather Brown also had a very low erythropoietin level of 5.7.  Heather Brown is complaining of a lot of muscle aches and pains.  Heather Brown does feel a little tired.  Heather Brown has no headaches.  Heather Brown is already taking baby aspirin.  Her ferritin was 50 with iron saturation of 30%.  Heather Brown and her husband are getting ready to go on a river cruise over to [place] next week.  As such, I want to try to get her blood down a little bit before Heather Brown goes on her vacation.  We will go ahead and phlebotomize her today.  I had a long talk with her today about polycythemia vera.  I told her that this was a bone marrow problem which Heather Brown just makes too many red blood cells.  If not treated, Heather Brown is at a high risk for stroke, heart attacks, and thromboembolic events.  I do not consider polycythemia as a bone marrow cancer.  Our goal is to make sure that Heather Brown has no complications from this.  Otherwise, Heather Brown is feeling okay.  Again Heather Brown does have some fatigue.  Heather Brown has gained a little weight.  Heather Brown is trying to watch her weight.  Overall, her performance status is ECOG 1.  Medications:  Current Outpatient Medications:  .  aspirin 81 MG tablet, Take 81 mg by mouth daily., Disp: , Rfl:  .  Biotin 2500 MCG CAPS, Take by mouth. Three times weekly, Disp: , Rfl:  .  CALCIUM CARBONATE ANTACID PO, Take 1,000 mg by mouth daily as needed for indigestion or heartburn. , Disp: ,  Rfl:  .  Cholecalciferol (VITAMIN D3) 1000 UNITS CAPS, Take 1,000 Units by mouth daily., Disp: , Rfl:  .  fish oil-omega-3 fatty acids 1000 MG capsule, Take 1 g by mouth daily. With 360 mg omega-3, Disp: , Rfl:  .  levothyroxine (SYNTHROID, LEVOTHROID) 50 MCG tablet, TK 1 T PO QD, Disp: , Rfl: 4 .  loratadine (CLARITIN) 10 MG tablet, Take 10 mg by mouth daily as needed for allergies. , Disp: , Rfl:  .  Multiple Vitamin (MULTIVITAMIN) capsule, Take 1 capsule by mouth daily., Disp: , Rfl:  .  triamcinolone ointment (KENALOG) 0.5 %, Apply 1 application topically 2 (two) times daily., Disp: 30 g, Rfl: 2 No current facility-administered medications for this visit.   Facility-Administered Medications Ordered in Other Visits:  .  0.9 %  sodium chloride infusion, , Intravenous, Continuous, Diann Bangerter, Rudell Cobb, MD  Allergies:  Allergies  Allergen Reactions  . Sulfa Antibiotics Hives    Past Medical History, Surgical history, Social history, and Family History were reviewed and updated.  Review of Systems: Review of Systems  HENT:  Negative.   Eyes: Negative.   Respiratory: Positive for shortness of breath.   Cardiovascular: Negative.   Gastrointestinal: Positive for abdominal distention.  Endocrine: Negative.  Musculoskeletal: Positive for arthralgias, flank pain, myalgias and neck stiffness.  Skin: Negative.   Neurological: Negative.   Hematological: Negative.   Psychiatric/Behavioral: Negative.     Physical Exam:  weight is 168 lb (76.2 kg). Her oral temperature is 98.2 F (36.8 C). Her blood pressure is 118/65 and her pulse is 81. Her respiration is 18 and oxygen saturation is 97%.   Wt Readings from Last 3 Encounters:  12/02/17 168 lb (76.2 kg)  10/01/17 166 lb 4 oz (75.4 kg)  08/19/17 168 lb (76.2 kg)    Physical Exam  Constitutional: Heather Brown is oriented to person, place, and time.  HENT:  Head: Normocephalic and atraumatic.  Mouth/Throat: Oropharynx is clear and moist.  Eyes:  Pupils are equal, round, and reactive to light. EOM are normal.  Neck: Normal range of motion.  Cardiovascular: Normal rate, regular rhythm and normal heart sounds.  Pulmonary/Chest: Effort normal and breath sounds normal.  Abdominal: Soft. Bowel sounds are normal.  Musculoskeletal: Normal range of motion. Heather Brown exhibits no edema, tenderness or deformity.  Lymphadenopathy:    Heather Brown has no cervical adenopathy.  Neurological: Heather Brown is alert and oriented to person, place, and time.  Skin: Skin is warm and dry. No rash noted. No erythema.  Psychiatric: Heather Brown has a normal mood and affect. Her behavior is normal. Judgment and thought content normal.  Vitals reviewed.    Lab Results  Component Value Date   WBC 4.0 12/02/2017   HGB 15.8 12/02/2017   HCT 47.7 (H) 12/02/2017   MCV 88.5 12/02/2017   PLT 231 12/02/2017     Chemistry      Component Value Date/Time   NA 141 10/01/2017 1042   NA 140 08/19/2017 1517   K 4.0 10/01/2017 1042   CL 108 10/01/2017 1042   CO2 25 10/01/2017 1042   BUN 12 10/01/2017 1042   BUN 15 08/19/2017 1517   CREATININE 0.77 10/01/2017 1042   CREATININE 0.79 03/10/2014 0836      Component Value Date/Time   CALCIUM 10.5 (H) 10/01/2017 1042   ALKPHOS 125 10/01/2017 1042   AST 36 (H) 10/01/2017 1042   ALT 47 10/01/2017 1042   BILITOT 1.6 (H) 10/01/2017 1042         Impression and Plan: [name] is a 72 year old white female.  Again, I suspect that Heather Brown does have polycythemia vera.  We will go ahead and start her on a phlebotomy program.  I do not think we will have to do this all that off as her blood count is not all that bad.  I told her that Heather Brown must avoid taking any iron supplements.  Outside of that, Heather Brown can do what Heather Brown likes.  Heather Brown can drink wine.  I would like to see her back in another month or so.  We will had to monitor her blood count.  I spent about 35 minutes with her today.  All the time was spent face-to-face with her.  I reviewed all of her  lab work.  We had a long talk about polycythemia and how to prevent complications from this.  I also told her to make sure that Heather Brown takes her aspirin in the morning with food.   [name], MD 5/6/20191:19 PM

## 2017-12-02 NOTE — Progress Notes (Signed)
Heather Brown presents today for phlebotomy per MD orders. Phlebotomy procedure started at 1305  and ended at 1335. 505 grams removed via 20 G IV to RAC. Patient observed for 30 minutes after procedure without any incident while receiving IVF as per orders; refer to [facility]. Patient tolerated procedure well. Marland Kitchen

## 2017-12-03 LAB — ERYTHROPOIETIN: Erythropoietin: 7 m[IU]/mL (ref 2.6–18.5)

## 2017-12-10 DIAGNOSIS — Z882 Allergy status to sulfonamides status: Secondary | ICD-10-CM | POA: Diagnosis not present

## 2017-12-10 DIAGNOSIS — R040 Epistaxis: Secondary | ICD-10-CM | POA: Diagnosis not present

## 2017-12-31 DIAGNOSIS — J329 Chronic sinusitis, unspecified: Secondary | ICD-10-CM | POA: Diagnosis not present

## 2017-12-31 DIAGNOSIS — D45 Polycythemia vera: Secondary | ICD-10-CM | POA: Diagnosis not present

## 2018-01-02 ENCOUNTER — Telehealth: Payer: Self-pay | Admitting: *Deleted

## 2018-01-02 NOTE — Telephone Encounter (Signed)
Patient's PCP has prescribed Amoxicillin for a sinus infection. She wants to make sure it's okay with Dr Marin Olp to proceed.  Dr Marin Olp is okay with patient taking her amoxicillin as prescribed. Patient aware.

## 2018-01-06 DIAGNOSIS — H0015 Chalazion left lower eyelid: Secondary | ICD-10-CM | POA: Diagnosis not present

## 2018-01-09 DIAGNOSIS — D2362 Other benign neoplasm of skin of left upper limb, including shoulder: Secondary | ICD-10-CM | POA: Diagnosis not present

## 2018-01-09 DIAGNOSIS — L820 Inflamed seborrheic keratosis: Secondary | ICD-10-CM | POA: Diagnosis not present

## 2018-01-09 DIAGNOSIS — L821 Other seborrheic keratosis: Secondary | ICD-10-CM | POA: Diagnosis not present

## 2018-01-09 DIAGNOSIS — D225 Melanocytic nevi of trunk: Secondary | ICD-10-CM | POA: Diagnosis not present

## 2018-01-13 ENCOUNTER — Inpatient Hospital Stay: Payer: Medicare Other

## 2018-01-13 ENCOUNTER — Encounter: Payer: Self-pay | Admitting: Hematology & Oncology

## 2018-01-13 ENCOUNTER — Inpatient Hospital Stay: Payer: Medicare Other | Attending: Hematology & Oncology | Admitting: Hematology & Oncology

## 2018-01-13 ENCOUNTER — Other Ambulatory Visit: Payer: Self-pay

## 2018-01-13 VITALS — BP 120/70 | HR 80 | Temp 98.1°F | Resp 18 | Wt 169.0 lb

## 2018-01-13 DIAGNOSIS — D45 Polycythemia vera: Secondary | ICD-10-CM | POA: Insufficient documentation

## 2018-01-13 DIAGNOSIS — Z7982 Long term (current) use of aspirin: Secondary | ICD-10-CM | POA: Diagnosis not present

## 2018-01-13 LAB — CBC WITH DIFFERENTIAL (CANCER CENTER ONLY)
BASOS ABS: 0 10*3/uL (ref 0.0–0.1)
Basophils Relative: 1 %
EOS ABS: 0.2 10*3/uL (ref 0.0–0.5)
EOS PCT: 5 %
HCT: 42.4 % (ref 34.8–46.6)
Hemoglobin: 13.9 g/dL (ref 11.6–15.9)
LYMPHS PCT: 40 %
Lymphs Abs: 1.5 10*3/uL (ref 0.9–3.3)
MCH: 28.2 pg (ref 26.0–34.0)
MCHC: 32.8 g/dL (ref 32.0–36.0)
MCV: 86 fL (ref 81.0–101.0)
MONO ABS: 0.7 10*3/uL (ref 0.1–0.9)
Monocytes Relative: 17 %
Neutro Abs: 1.4 10*3/uL — ABNORMAL LOW (ref 1.5–6.5)
Neutrophils Relative %: 37 %
PLATELETS: 294 10*3/uL (ref 145–400)
RBC: 4.93 MIL/uL (ref 3.70–5.32)
RDW: 13.5 % (ref 11.1–15.7)
WBC Count: 3.9 10*3/uL (ref 3.9–10.0)

## 2018-01-13 LAB — CMP (CANCER CENTER ONLY)
ALT: 69 U/L — ABNORMAL HIGH (ref 0–55)
ANION GAP: 6 (ref 3–11)
AST: 55 U/L — ABNORMAL HIGH (ref 5–34)
Albumin: 3.6 g/dL (ref 3.5–5.0)
Alkaline Phosphatase: 139 U/L (ref 40–150)
BUN: 16 mg/dL (ref 7–26)
CHLORIDE: 108 mmol/L (ref 98–109)
CO2: 27 mmol/L (ref 22–29)
CREATININE: 0.81 mg/dL (ref 0.60–1.10)
Calcium: 10.4 mg/dL (ref 8.4–10.4)
Glucose, Bld: 80 mg/dL (ref 70–140)
Potassium: 4.9 mmol/L (ref 3.5–5.1)
SODIUM: 141 mmol/L (ref 136–145)
Total Bilirubin: 0.7 mg/dL (ref 0.2–1.2)
Total Protein: 6.7 g/dL (ref 6.4–8.3)

## 2018-01-13 NOTE — Progress Notes (Signed)
Hematology and Oncology Follow Up Visit  Heather Brown [id number] 07-13-1946 72 y.o. 01/13/2018   Principle Diagnosis:   Polycythemia vera -- DNMT3A (+),  Epo = 5.7  Current Therapy:    Phlebotomy to maintain hematocrit below 45%  Aspirin 81 mg p.o. daily     Interim History:  Heather Brown is back for follow-up.  She feels well.  She feels better after having the phlebotomies.  She says that her legs do not hurt much.  She is planning a very busy summer.  They are helping take care of a grandson.  There are 16 grandchildren in all.  [place]  Her last iron studies back in March showed a ferritin of 50 with an iron saturation of 30%.  There is no headaches.  She is on baby aspirin.  I told her to make sure that she takes it with food since it is not a coated aspirin.  Overall, her performance status is ECOG 1.  Medications:  Current Outpatient Medications:  .  amoxicillin-clavulanate (AUGMENTIN) 875-125 MG tablet, TK 1 T PO Q 12 H FOR 10 DAYS, Disp: , Rfl: 0 .  aspirin 81 MG tablet, Take 81 mg by mouth daily., Disp: , Rfl:  .  Biotin 2500 MCG CAPS, Take by mouth. Three times weekly, Disp: , Rfl:  .  CALCIUM CARBONATE ANTACID PO, Take 1,000 mg by mouth daily as needed for indigestion or heartburn. , Disp: , Rfl:  .  Cholecalciferol (VITAMIN D3) 1000 UNITS CAPS, Take 1,000 Units by mouth daily., Disp: , Rfl:  .  fish oil-omega-3 fatty acids 1000 MG capsule, Take 1 g by mouth daily. With 360 mg omega-3, Disp: , Rfl:  .  levothyroxine (SYNTHROID, LEVOTHROID) 50 MCG tablet, TK 1 T PO QD, Disp: , Rfl: 4 .  loratadine (CLARITIN) 10 MG tablet, Take 10 mg by mouth daily as needed for allergies. , Disp: , Rfl:  .  Multiple Vitamin (MULTIVITAMIN) capsule, Take 1 capsule by mouth daily., Disp: , Rfl:  .  tretinoin (RETIN-A) 0.05 % cream, APPLY PEA SIZED AMOUNT TO AFFECTED AREA OF FACE NIGHTLY, Disp: , Rfl: 5 .  triamcinolone ointment (KENALOG)  0.5 %, Apply 1 application topically 2 (two) times daily., Disp: 30 g, Rfl: 2  Allergies:  Allergies  Allergen Reactions  . Sulfa Antibiotics Hives    Past Medical History, Surgical history, Social history, and Family History were reviewed and updated.  Review of Systems: Review of Systems  HENT:  Negative.   Eyes: Negative.   Respiratory: Positive for shortness of breath.   Cardiovascular: Negative.   Gastrointestinal: Positive for abdominal distention.  Endocrine: Negative.   Musculoskeletal: Positive for arthralgias, flank pain, myalgias and neck stiffness.  Skin: Negative.   Neurological: Negative.   Hematological: Negative.   Psychiatric/Behavioral: Negative.     Physical Exam:  weight is 169 lb (76.7 kg). Her oral temperature is 98.1 F (36.7 C). Her blood pressure is 120/70 and her pulse is 80. Her respiration is 18 and oxygen saturation is 100%.   Wt Readings from Last 3 Encounters:  01/13/18 169 lb (76.7 kg)  12/02/17 168 lb (76.2 kg)  10/01/17 166 lb 4 oz (75.4 kg)    Physical Exam  Constitutional: She is oriented to person, place, and time.  HENT:  Head: Normocephalic and atraumatic.  Mouth/Throat: Oropharynx is clear and moist.  Eyes: Pupils are equal, round, and reactive to light. EOM are  normal.  Neck: Normal range of motion.  Cardiovascular: Normal rate, regular rhythm and normal heart sounds.  Pulmonary/Chest: Effort normal and breath sounds normal.  Abdominal: Soft. Bowel sounds are normal.  Musculoskeletal: Normal range of motion. She exhibits no edema, tenderness or deformity.  Lymphadenopathy:    She has no cervical adenopathy.  Neurological: She is alert and oriented to person, place, and time.  Skin: Skin is warm and dry. No rash noted. No erythema.  Psychiatric: She has a normal mood and affect. Her behavior is normal. Judgment and thought content normal.  Vitals reviewed.    Lab Results  Component Value Date   WBC 3.9 01/13/2018   HGB  13.9 01/13/2018   HCT 42.4 01/13/2018   MCV 86.0 01/13/2018   PLT 294 01/13/2018     Chemistry      Component Value Date/Time   NA 145 12/02/2017 1154   NA 140 08/19/2017 1517   K 4.2 12/02/2017 1154   CL 109 12/02/2017 1154   CO2 27 12/02/2017 1154   BUN 17 12/02/2017 1154   BUN 15 08/19/2017 1517   CREATININE 0.82 12/02/2017 1154   CREATININE 0.79 03/10/2014 0836      Component Value Date/Time   CALCIUM 10.6 (H) 12/02/2017 1154   ALKPHOS 136 12/02/2017 1154   AST 40 (H) 12/02/2017 1154   ALT 56 (H) 12/02/2017 1154   BILITOT 1.0 12/02/2017 1154         Impression and Plan: Heather Brown is a 72 year old white female.  Again, I suspect that she does have polycythemia vera.  We will go ahead and start her on a phlebotomy program.  I do not think we will have to do this all that off as her blood count is not all that bad.  I told her that she must avoid taking any iron supplements.  Outside of that, she can do what she likes.  She can drink wine.  At this point, she does not need to be phlebotomized.  I think we can see her back after Labor Day.  I told her to drink a lot of water and to [place] sunscreen [place].   [name], MD 6/17/201912:32 PM

## 2018-01-14 LAB — IRON AND TIBC
IRON: 48 ug/dL (ref 41–142)
Saturation Ratios: 12 % — ABNORMAL LOW (ref 21–57)
TIBC: 395 ug/dL (ref 236–444)
UIBC: 347 ug/dL

## 2018-01-14 LAB — FERRITIN: FERRITIN: 16 ng/mL (ref 9–269)

## 2018-01-23 ENCOUNTER — Telehealth: Payer: Self-pay | Admitting: Obstetrics & Gynecology

## 2018-01-23 NOTE — Telephone Encounter (Signed)
Spoke with patient. Patient reports vaginal itching on labia, started 3 days ago. Completed amoxicillin 2 wks ago for sinus infection.   Denies vaginal d/c, odor, urinary symptoms, pain or bleeding.   Offered OV, patient declined. Recommended OTC monistat, if symptoms do not resolve, or new symptoms develop,  will need OV for further evaluation. Advised patient Dr. Sabra Heck will review, I will return call if any additional recommendations. Patient agreeable.  Routing to provider for final review. Patient is agreeable to disposition. Will close encounter.

## 2018-01-23 NOTE — Telephone Encounter (Signed)
Patient has been experiencing vaginal itching for 3 days and would like advice on what to purchase or if she needs to be seen. Was taking antibiotic for sinus infection 2 weeks ago and unsure if it is related.

## 2018-04-14 ENCOUNTER — Other Ambulatory Visit: Payer: Self-pay

## 2018-04-14 ENCOUNTER — Inpatient Hospital Stay: Payer: Medicare Other

## 2018-04-14 ENCOUNTER — Inpatient Hospital Stay: Payer: Medicare Other | Attending: Hematology & Oncology | Admitting: Hematology & Oncology

## 2018-04-14 VITALS — BP 143/71 | HR 86 | Temp 98.7°F | Resp 19 | Wt 169.0 lb

## 2018-04-14 DIAGNOSIS — D45 Polycythemia vera: Secondary | ICD-10-CM

## 2018-04-14 LAB — CMP (CANCER CENTER ONLY)
ALK PHOS: 139 U/L — AB (ref 38–126)
ALT: 36 U/L (ref 0–44)
ANION GAP: 8 (ref 5–15)
AST: 29 U/L (ref 15–41)
Albumin: 3.5 g/dL (ref 3.5–5.0)
BUN: 16 mg/dL (ref 8–23)
CALCIUM: 10.4 mg/dL — AB (ref 8.9–10.3)
CO2: 24 mmol/L (ref 22–32)
Chloride: 111 mmol/L (ref 98–111)
Creatinine: 0.84 mg/dL (ref 0.44–1.00)
Glucose, Bld: 94 mg/dL (ref 70–99)
Potassium: 4.3 mmol/L (ref 3.5–5.1)
SODIUM: 143 mmol/L (ref 135–145)
TOTAL PROTEIN: 6.8 g/dL (ref 6.5–8.1)
Total Bilirubin: 0.8 mg/dL (ref 0.3–1.2)

## 2018-04-14 LAB — CBC WITH DIFFERENTIAL (CANCER CENTER ONLY)
Basophils Absolute: 0 10*3/uL (ref 0.0–0.1)
Basophils Relative: 1 %
EOS ABS: 0.2 10*3/uL (ref 0.0–0.5)
Eosinophils Relative: 4 %
HEMATOCRIT: 43.9 % (ref 34.8–46.6)
HEMOGLOBIN: 14 g/dL (ref 11.6–15.9)
LYMPHS ABS: 1.5 10*3/uL (ref 0.9–3.3)
Lymphocytes Relative: 40 %
MCH: 26.5 pg (ref 26.0–34.0)
MCHC: 31.9 g/dL — AB (ref 32.0–36.0)
MCV: 83 fL (ref 81.0–101.0)
MONOS PCT: 14 %
Monocytes Absolute: 0.5 10*3/uL (ref 0.1–0.9)
NEUTROS PCT: 41 %
Neutro Abs: 1.6 10*3/uL (ref 1.5–6.5)
Platelet Count: 252 10*3/uL (ref 145–400)
RBC: 5.29 MIL/uL (ref 3.70–5.32)
RDW: 15.2 % (ref 11.1–15.7)
WBC Count: 3.7 10*3/uL — ABNORMAL LOW (ref 3.9–10.0)

## 2018-04-14 NOTE — Progress Notes (Signed)
Hematology and Oncology Follow Up Visit  Heather Brown [id number] August 08, 1945 72 y.o. 04/14/2018   Principle Diagnosis:   Polycythemia vera -- DNMT3A (+),  Epo = 5.7  Current Therapy:    Phlebotomy to maintain hematocrit below 45%  Aspirin 81 mg p.o. daily     Interim History:  Heather Brown is back for follow-up.  She has been quite busy this summer.  She actually went over to [place].  She and her husband do a lot of traveling.  Unfortunately, while she was in [place], she fell and broke her left shoulder.  Surprisingly, she did not need surgery for this.  She seems to be doing pretty well with it.  Her polycythemia has been under very good control.  We have not had a phlebotomize her probably for good for 5 months.  She is watching what she eats.  She is having no problems with bowels or bladder.  She is taken her baby aspirin.  Her iron studies back in June showed a ferritin of 16 with iron saturation of 12%.  She and her husband are getting ready to go to [place].  They just got back from [place].  Overall, her performance status is ECOG 0.   Medications:  Current Outpatient Medications:  .  amoxicillin-clavulanate (AUGMENTIN) 875-125 MG tablet, TK 1 T PO Q 12 H FOR 10 DAYS, Disp: , Rfl: 0 .  aspirin 81 MG tablet, Take 81 mg by mouth daily., Disp: , Rfl:  .  Biotin 2500 MCG CAPS, Take by mouth. Three times weekly, Disp: , Rfl:  .  CALCIUM CARBONATE ANTACID PO, Take 1,000 mg by mouth daily as needed for indigestion or heartburn. , Disp: , Rfl:  .  Cholecalciferol (VITAMIN D3) 1000 UNITS CAPS, Take 1,000 Units by mouth daily., Disp: , Rfl:  .  fish oil-omega-3 fatty acids 1000 MG capsule, Take 1 g by mouth daily. With 360 mg omega-3, Disp: , Rfl:  .  levothyroxine (SYNTHROID, LEVOTHROID) 50 MCG tablet, TK 1 T PO QD, Disp: , Rfl: 4 .  loratadine (CLARITIN) 10 MG tablet, Take 10 mg by mouth daily as needed for allergies. , Disp: , Rfl:  .  Multiple Vitamin (MULTIVITAMIN)  capsule, Take 1 capsule by mouth daily., Disp: , Rfl:  .  tretinoin (RETIN-A) 0.05 % cream, APPLY PEA SIZED AMOUNT TO AFFECTED AREA OF FACE NIGHTLY, Disp: , Rfl: 5 .  triamcinolone ointment (KENALOG) 0.5 %, Apply 1 application topically 2 (two) times daily., Disp: 30 g, Rfl: 2  Allergies:  Allergies  Allergen Reactions  . Sulfa Antibiotics Hives    Past Medical History, Surgical history, Social history, and Family History were reviewed and updated.  Review of Systems: Review of Systems  HENT:  Negative.   Eyes: Negative.   Respiratory: Positive for shortness of breath.   Cardiovascular: Negative.   Gastrointestinal: Positive for abdominal distention.  Endocrine: Negative.   Musculoskeletal: Positive for arthralgias, flank pain, myalgias and neck stiffness.  Skin: Negative.   Neurological: Negative.   Hematological: Negative.   Psychiatric/Behavioral: Negative.     Physical Exam:  weight is 169 lb (76.7 kg). Her oral temperature is 98.7 F (37.1 C). Her blood pressure is 143/71 (abnormal) and her pulse is 86. Her respiration is 19 and oxygen saturation is 97%.   Wt Readings from Last 3 Encounters:  04/14/18 169 lb (76.7 kg)  01/13/18 169 lb (76.7 kg)  12/02/17 168 lb (76.2 kg)    Physical Exam  Constitutional: She  is oriented to person, place, and time.  HENT:  Head: Normocephalic and atraumatic.  Mouth/Throat: Oropharynx is clear and moist.  Eyes: Pupils are equal, round, and reactive to light. EOM are normal.  Neck: Normal range of motion.  Cardiovascular: Normal rate, regular rhythm and normal heart sounds.  Pulmonary/Chest: Effort normal and breath sounds normal.  Abdominal: Soft. Bowel sounds are normal.  Musculoskeletal: Normal range of motion. She exhibits no edema, tenderness or deformity.  Lymphadenopathy:    She has no cervical adenopathy.  Neurological: She is alert and oriented to person, place, and time.  Skin: Skin is warm and dry. No rash noted. No  erythema.  Psychiatric: She has a normal mood and affect. Her behavior is normal. Judgment and thought content normal.  Vitals reviewed.    Lab Results  Component Value Date   WBC 3.7 (L) 04/14/2018   HGB 14.0 04/14/2018   HCT 43.9 04/14/2018   MCV 83.0 04/14/2018   PLT 252 04/14/2018     Chemistry      Component Value Date/Time   NA 141 01/13/2018 1150   NA 140 08/19/2017 1517   K 4.9 01/13/2018 1150   CL 108 01/13/2018 1150   CO2 27 01/13/2018 1150   BUN 16 01/13/2018 1150   BUN 15 08/19/2017 1517   CREATININE 0.81 01/13/2018 1150   CREATININE 0.79 03/10/2014 0836      Component Value Date/Time   CALCIUM 10.4 01/13/2018 1150   ALKPHOS 139 01/13/2018 1150   AST 55 (H) 01/13/2018 1150   ALT 69 (H) 01/13/2018 1150   BILITOT 0.7 01/13/2018 1150         Impression and Plan: Heather Brown is a 72 year old white female.  She has polycythemia vera.  She does not need to be phlebotomized today.  I would like to get her back right before the holiday season.  I want to make sure that everything is okay with her blood counts so she will not have any issues over the holidays.  Heather Napoleon, MD 9/16/20191:33 PM

## 2018-04-15 LAB — IRON AND TIBC
Iron: 52 ug/dL (ref 41–142)
Saturation Ratios: 13 % — ABNORMAL LOW (ref 21–57)
TIBC: 403 ug/dL (ref 236–444)
UIBC: 351 ug/dL

## 2018-04-15 LAB — FERRITIN: Ferritin: 5 ng/mL — ABNORMAL LOW (ref 11–307)

## 2018-05-16 DIAGNOSIS — Z23 Encounter for immunization: Secondary | ICD-10-CM | POA: Diagnosis not present

## 2018-05-19 DIAGNOSIS — H6122 Impacted cerumen, left ear: Secondary | ICD-10-CM | POA: Diagnosis not present

## 2018-06-16 DIAGNOSIS — H6122 Impacted cerumen, left ear: Secondary | ICD-10-CM | POA: Diagnosis not present

## 2018-06-16 DIAGNOSIS — H5789 Other specified disorders of eye and adnexa: Secondary | ICD-10-CM | POA: Diagnosis not present

## 2018-06-16 DIAGNOSIS — R42 Dizziness and giddiness: Secondary | ICD-10-CM | POA: Diagnosis not present

## 2018-06-17 ENCOUNTER — Inpatient Hospital Stay: Payer: Medicare Other

## 2018-06-17 ENCOUNTER — Inpatient Hospital Stay: Payer: Medicare Other | Attending: Hematology & Oncology

## 2018-06-17 ENCOUNTER — Inpatient Hospital Stay (HOSPITAL_BASED_OUTPATIENT_CLINIC_OR_DEPARTMENT_OTHER): Payer: Medicare Other | Admitting: Hematology & Oncology

## 2018-06-17 VITALS — BP 138/74 | HR 79 | Temp 98.1°F | Wt 173.2 lb

## 2018-06-17 VITALS — BP 130/71

## 2018-06-17 DIAGNOSIS — R42 Dizziness and giddiness: Secondary | ICD-10-CM | POA: Diagnosis not present

## 2018-06-17 DIAGNOSIS — D45 Polycythemia vera: Secondary | ICD-10-CM | POA: Diagnosis not present

## 2018-06-17 DIAGNOSIS — Z7982 Long term (current) use of aspirin: Secondary | ICD-10-CM

## 2018-06-17 LAB — CBC WITH DIFFERENTIAL (CANCER CENTER ONLY)
ABS IMMATURE GRANULOCYTES: 0.01 10*3/uL (ref 0.00–0.07)
BASOS PCT: 1 %
Basophils Absolute: 0.1 10*3/uL (ref 0.0–0.1)
EOS PCT: 3 %
Eosinophils Absolute: 0.1 10*3/uL (ref 0.0–0.5)
HCT: 48.5 % — ABNORMAL HIGH (ref 36.0–46.0)
Hemoglobin: 15.1 g/dL — ABNORMAL HIGH (ref 12.0–15.0)
Immature Granulocytes: 0 %
Lymphocytes Relative: 31 %
Lymphs Abs: 1.3 10*3/uL (ref 0.7–4.0)
MCH: 26.4 pg (ref 26.0–34.0)
MCHC: 31.1 g/dL (ref 30.0–36.0)
MCV: 84.9 fL (ref 80.0–100.0)
MONO ABS: 0.5 10*3/uL (ref 0.1–1.0)
MONOS PCT: 12 %
NEUTROS ABS: 2.2 10*3/uL (ref 1.7–7.7)
Neutrophils Relative %: 53 %
PLATELETS: 223 10*3/uL (ref 150–400)
RBC: 5.71 MIL/uL — ABNORMAL HIGH (ref 3.87–5.11)
RDW: 15.1 % (ref 11.5–15.5)
WBC Count: 4.2 10*3/uL (ref 4.0–10.5)
nRBC: 0 % (ref 0.0–0.2)

## 2018-06-17 LAB — CMP (CANCER CENTER ONLY)
ALK PHOS: 141 U/L — AB (ref 38–126)
ALT: 49 U/L — AB (ref 0–44)
AST: 36 U/L (ref 15–41)
Albumin: 3.8 g/dL (ref 3.5–5.0)
Anion gap: 7 (ref 5–15)
BILIRUBIN TOTAL: 0.9 mg/dL (ref 0.3–1.2)
BUN: 16 mg/dL (ref 8–23)
CALCIUM: 10.6 mg/dL — AB (ref 8.9–10.3)
CHLORIDE: 108 mmol/L (ref 98–111)
CO2: 26 mmol/L (ref 22–32)
CREATININE: 0.9 mg/dL (ref 0.44–1.00)
GFR, Est AFR Am: >60 mL/min (ref >60)
Glucose, Bld: 84 mg/dL (ref 70–99)
Potassium: 4.5 mmol/L (ref 3.5–5.1)
Sodium: 141 mmol/L (ref 135–145)
TOTAL PROTEIN: 7.6 g/dL (ref 6.5–8.1)

## 2018-06-17 LAB — IRON AND TIBC
Iron: 76 ug/dL (ref 41–142)
SATURATION RATIOS: 17 % — AB (ref 21–57)
TIBC: 448 ug/dL — AB (ref 236–444)
UIBC: 372 ug/dL (ref 120–384)

## 2018-06-17 LAB — LACTATE DEHYDROGENASE: LDH: 207 U/L — AB (ref 98–192)

## 2018-06-17 LAB — FERRITIN: Ferritin: 7 ng/mL — ABNORMAL LOW (ref 11–307)

## 2018-06-17 LAB — SAVE SMEAR(SSMR), FOR PROVIDER SLIDE REVIEW

## 2018-06-17 NOTE — Progress Notes (Signed)
Hematology and Oncology Follow Up Visit  LENETTE RAU [id number] Dec 14, 1945 72 y.o. 06/17/2018   Principle Diagnosis:   Polycythemia vera -- DNMT3A (+),  Epo = 5.7  Current Therapy:    Phlebotomy to maintain hematocrit below 45%  Aspirin 81 mg p.o. daily     Interim History:  Ms. Iglesia is back for follow-up.  She is having some problems with vertigo.  This seemed to happen a couple days ago.  She was down in [place].  There was going down there with grandchildren.  There was have a good time in [place].  She saw her family doctor.  I think he gave her some treatment to help with the vertigo.  She has not had a phlebotomy for 6 months.  We last saw her, her hematocrit was 43.2.  She is had no issues with shortness of breath.  There is been no cough.  She had a good appetite.  She has had no change in bowel or bladder habits.  There has been no diarrhea.  She has had no leg swelling.  She has had no rashes.  Overall, her performance status is ECOG 1.    Medications:  Current Outpatient Medications:  .  aspirin 81 MG tablet, Take 81 mg by mouth daily., Disp: , Rfl:  .  Biotin 2500 MCG CAPS, Take by mouth. Three times weekly, Disp: , Rfl:  .  CALCIUM CARBONATE ANTACID PO, Take 1,000 mg by mouth daily as needed for indigestion or heartburn. , Disp: , Rfl:  .  Cholecalciferol (VITAMIN D3) 1000 UNITS CAPS, Take 1,000 Units by mouth daily., Disp: , Rfl:  .  fish oil-omega-3 fatty acids 1000 MG capsule, Take 1 g by mouth daily. With 360 mg omega-3, Disp: , Rfl:  .  levothyroxine (SYNTHROID, LEVOTHROID) 50 MCG tablet, TK 1 T PO QD, Disp: , Rfl: 4 .  loratadine (CLARITIN) 10 MG tablet, Take 10 mg by mouth daily as needed for allergies. , Disp: , Rfl:  .  Multiple Vitamin (MULTIVITAMIN) capsule, Take 1 capsule by mouth daily., Disp: , Rfl:  .  tretinoin (RETIN-A) 0.05 % cream, APPLY PEA SIZED AMOUNT TO AFFECTED AREA OF FACE NIGHTLY, Disp: , Rfl: 5 .  triamcinolone ointment (KENALOG)  0.5 %, Apply 1 application topically 2 (two) times daily., Disp: 30 g, Rfl: 2  Allergies:  Allergies  Allergen Reactions  . Sulfa Antibiotics Hives    Past Medical History, Surgical history, Social history, and Family History were reviewed and updated.  Review of Systems: Review of Systems  HENT:  Negative.   Eyes: Negative.   Respiratory: Positive for shortness of breath.   Cardiovascular: Negative.   Gastrointestinal: Positive for abdominal distention.  Endocrine: Negative.   Musculoskeletal: Positive for arthralgias, flank pain, myalgias and neck stiffness.  Skin: Negative.   Neurological: Negative.   Hematological: Negative.   Psychiatric/Behavioral: Negative.     Physical Exam:  weight is 173 lb 4 oz (78.6 kg). Her oral temperature is 98.1 F (36.7 C). Her blood pressure is 138/74 and her pulse is 79. Her oxygen saturation is 100%.   Wt Readings from Last 3 Encounters:  06/17/18 173 lb 4 oz (78.6 kg)  04/14/18 169 lb (76.7 kg)  01/13/18 169 lb (76.7 kg)    Physical Exam  Constitutional: She is oriented to person, place, and time.  HENT:  Head: Normocephalic and atraumatic.  Mouth/Throat: Oropharynx is clear and moist.  Eyes: Pupils are equal, round, and reactive to light. EOM  are normal.  Neck: Normal range of motion.  Cardiovascular: Normal rate, regular rhythm and normal heart sounds.  Pulmonary/Chest: Effort normal and breath sounds normal.  Abdominal: Soft. Bowel sounds are normal.  Musculoskeletal: Normal range of motion. She exhibits no edema, tenderness or deformity.  Lymphadenopathy:    She has no cervical adenopathy.  Neurological: She is alert and oriented to person, place, and time.  Skin: Skin is warm and dry. No rash noted. No erythema.  Psychiatric: She has a normal mood and affect. Her behavior is normal. Judgment and thought content normal.  Vitals reviewed.    Lab Results  Component Value Date   WBC 4.2 06/17/2018   HGB 15.1 (H)  06/17/2018   HCT 48.5 (H) 06/17/2018   MCV 84.9 06/17/2018   PLT 223 06/17/2018     Chemistry      Component Value Date/Time   NA 143 04/14/2018 1214   NA 140 08/19/2017 1517   K 4.3 04/14/2018 1214   CL 111 04/14/2018 1214   CO2 24 04/14/2018 1214   BUN 16 04/14/2018 1214   BUN 15 08/19/2017 1517   CREATININE 0.84 04/14/2018 1214   CREATININE 0.79 03/10/2014 0836      Component Value Date/Time   CALCIUM 10.4 (H) 04/14/2018 1214   ALKPHOS 139 (H) 04/14/2018 1214   AST 29 04/14/2018 1214   ALT 36 04/14/2018 1214   BILITOT 0.8 04/14/2018 1214         Impression and Plan: Ms. Masri is a 72 year old white female.  She has polycythemia vera.    We will go ahead and phlebotomize her today.  This is clearly necessary.  I do not think that the vertigo is from the polycythemia.  However, I cannot totally discount the possibility.  Given that her hematocrit is above 45%, she may have some "thick blood" and this might cause some difficulties.  I will plan to get her back to see [place] in another 3 months.  I think 15-month intervals for follow-up would be appropriate right now.  Again I told her not to take any vitamin C supplements and not to take any iron supplements.  She can eat what she would like.   [name], MD 11/19/201910:39 AM

## 2018-06-17 NOTE — Patient Instructions (Signed)
Therapeutic Phlebotomy Therapeutic phlebotomy is the controlled removal of blood from a person's body for the purpose of treating a medical condition. The procedure is similar to donating blood. Usually, about a pint (470 mL, or 0.47L) of blood is removed. The average adult has 9-12 pints (4.3-5.7 L) of blood. Therapeutic phlebotomy may be used to treat the following medical conditions:  Hemochromatosis. This is a condition in which the blood contains too much iron.  Polycythemia vera. This is a condition in which the blood contains too many red blood cells.  Porphyria cutanea tarda. This is a disease in which an important part of hemoglobin is not made properly. It results in the buildup of abnormal amounts of porphyrins in the body.  Sickle cell disease. This is a condition in which the red blood cells form an abnormal crescent shape rather than a round shape.  Tell a health care provider about:  Any allergies you have.  All medicines you are taking, including vitamins, herbs, eye drops, creams, and over-the-counter medicines.  Any problems you or family members have had with anesthetic medicines.  Any blood disorders you have.  Any surgeries you have had.  Any medical conditions you have. What are the risks? Generally, this is a safe procedure. However, problems may occur, including:  Nausea or light-headedness.  Low blood pressure.  Soreness, bleeding, swelling, or bruising at the needle insertion site.  Infection.  What happens before the procedure?  Follow instructions from your health care provider about eating or drinking restrictions.  Ask your health care provider about changing or stopping your regular medicines. This is especially important if you are taking diabetes medicines or blood thinners.  Wear clothing with sleeves that can be raised above the elbow.  Plan to have someone take you home after the procedure.  You may have a blood sample taken. What  happens during the procedure?  A needle will be inserted into one of your veins.  Tubing and a collection bag will be attached to that needle.  Blood will flow through the needle and tubing into the collection bag.  You may be asked to open and close your hand slowly and continually during the entire collection.  After the specified amount of blood has been removed from your body, the collection bag and tubing will be clamped.  The needle will be removed from your vein.  Pressure will be held on the site of the needle insertion to stop the bleeding.  A bandage (dressing) will be placed over the needle insertion site. The procedure may vary among health care providers and hospitals. What happens after the procedure?  Your recovery will be assessed and monitored.  You can return to your normal activities as directed by your health care provider. This information is not intended to replace advice given to you by your health care provider. Make sure you discuss any questions you have with your health care provider. Document Released: 12/18/2010 Document Revised: 03/17/2016 Document Reviewed: 07/12/2014 Elsevier Interactive Patient Education  2018 [employer]  

## 2018-06-17 NOTE — Progress Notes (Signed)
1 unit phlebotomy performed in right [facility] with a 16 gauge phlebotomy set over 10 minutes. Patient tolerated well. Nourishment provided.   Patient did not want her IVF replacement. Patient VSS. Patient discharged ambulatory without complaints or concerns.

## 2018-06-20 ENCOUNTER — Other Ambulatory Visit: Payer: Self-pay | Admitting: Endocrinology

## 2018-06-20 DIAGNOSIS — E041 Nontoxic single thyroid nodule: Secondary | ICD-10-CM

## 2018-06-24 ENCOUNTER — Other Ambulatory Visit: Payer: Medicare Other

## 2018-06-24 DIAGNOSIS — H25013 Cortical age-related cataract, bilateral: Secondary | ICD-10-CM | POA: Diagnosis not present

## 2018-06-24 DIAGNOSIS — H40013 Open angle with borderline findings, low risk, bilateral: Secondary | ICD-10-CM | POA: Diagnosis not present

## 2018-06-24 DIAGNOSIS — H2513 Age-related nuclear cataract, bilateral: Secondary | ICD-10-CM | POA: Diagnosis not present

## 2018-07-04 DIAGNOSIS — H11153 Pinguecula, bilateral: Secondary | ICD-10-CM | POA: Diagnosis not present

## 2018-07-04 DIAGNOSIS — H15102 Unspecified episcleritis, left eye: Secondary | ICD-10-CM | POA: Diagnosis not present

## 2018-07-14 DIAGNOSIS — E039 Hypothyroidism, unspecified: Secondary | ICD-10-CM | POA: Diagnosis not present

## 2018-07-16 DIAGNOSIS — E039 Hypothyroidism, unspecified: Secondary | ICD-10-CM | POA: Diagnosis not present

## 2018-07-16 DIAGNOSIS — E041 Nontoxic single thyroid nodule: Secondary | ICD-10-CM | POA: Diagnosis not present

## 2018-08-11 DIAGNOSIS — H40013 Open angle with borderline findings, low risk, bilateral: Secondary | ICD-10-CM | POA: Diagnosis not present

## 2018-08-11 DIAGNOSIS — H15102 Unspecified episcleritis, left eye: Secondary | ICD-10-CM | POA: Diagnosis not present

## 2018-08-11 DIAGNOSIS — H11153 Pinguecula, bilateral: Secondary | ICD-10-CM | POA: Diagnosis not present

## 2018-08-19 DIAGNOSIS — H15102 Unspecified episcleritis, left eye: Secondary | ICD-10-CM | POA: Diagnosis not present

## 2018-08-19 DIAGNOSIS — H11153 Pinguecula, bilateral: Secondary | ICD-10-CM | POA: Diagnosis not present

## 2018-08-20 ENCOUNTER — Ambulatory Visit
Admission: RE | Admit: 2018-08-20 | Discharge: 2018-08-20 | Disposition: A | Discharge status: Home / Self Care | Payer: Medicare Other | Source: Ambulatory Visit | Attending: Endocrinology | Admitting: Endocrinology

## 2018-08-20 DIAGNOSIS — E041 Nontoxic single thyroid nodule: Secondary | ICD-10-CM | POA: Diagnosis not present

## 2018-08-21 ENCOUNTER — Telehealth: Payer: Self-pay | Admitting: Obstetrics & Gynecology

## 2018-08-21 NOTE — Telephone Encounter (Signed)
Spoke with patient. Patient states she has been taking Vit D3 1000 IU for several years, asking if ok to continue this dosage?   Advised ok to continue Vit D 1,000 IUD daily. Advised Dr. Sabra Heck will review, I will return call if any additional recommendations.   Routing to provider for final review. Patient is agreeable to disposition. Will close encounter.

## 2018-08-21 NOTE — Telephone Encounter (Signed)
Patient cannot remember how much vitamin d she is supposed to be taking?

## 2018-08-22 NOTE — Telephone Encounter (Signed)
Agree with recommendations given.  Thanks.

## 2018-09-16 ENCOUNTER — Inpatient Hospital Stay: Payer: Medicare Other

## 2018-09-16 ENCOUNTER — Telehealth: Payer: Self-pay | Admitting: Hematology & Oncology

## 2018-09-16 ENCOUNTER — Inpatient Hospital Stay: Payer: Medicare Other | Attending: Hematology & Oncology | Admitting: Hematology & Oncology

## 2018-09-16 ENCOUNTER — Other Ambulatory Visit: Payer: Self-pay

## 2018-09-16 VITALS — BP 122/66 | HR 84 | Temp 97.8°F | Resp 20 | Wt 176.8 lb

## 2018-09-16 DIAGNOSIS — K760 Fatty (change of) liver, not elsewhere classified: Secondary | ICD-10-CM | POA: Diagnosis not present

## 2018-09-16 DIAGNOSIS — E039 Hypothyroidism, unspecified: Secondary | ICD-10-CM | POA: Diagnosis not present

## 2018-09-16 DIAGNOSIS — Z7982 Long term (current) use of aspirin: Secondary | ICD-10-CM | POA: Insufficient documentation

## 2018-09-16 DIAGNOSIS — D45 Polycythemia vera: Secondary | ICD-10-CM

## 2018-09-16 LAB — CMP (CANCER CENTER ONLY)
ALT: 33 U/L (ref 0–44)
AST: 26 U/L (ref 15–41)
Albumin: 4 g/dL (ref 3.5–5.0)
Alkaline Phosphatase: 130 U/L — ABNORMAL HIGH (ref 38–126)
Anion gap: 6 (ref 5–15)
BILIRUBIN TOTAL: 0.6 mg/dL (ref 0.3–1.2)
BUN: 16 mg/dL (ref 8–23)
CO2: 28 mmol/L (ref 22–32)
CREATININE: 0.81 mg/dL (ref 0.44–1.00)
Calcium: 10.1 mg/dL (ref 8.9–10.3)
Chloride: 108 mmol/L (ref 98–111)
Glucose, Bld: 90 mg/dL (ref 70–99)
POTASSIUM: 4.2 mmol/L (ref 3.5–5.1)
Sodium: 142 mmol/L (ref 135–145)
TOTAL PROTEIN: 6.4 g/dL — AB (ref 6.5–8.1)

## 2018-09-16 LAB — CBC WITH DIFFERENTIAL (CANCER CENTER ONLY)
Abs Immature Granulocytes: 0 10*3/uL (ref 0.00–0.07)
BASOS PCT: 1 %
Basophils Absolute: 0.1 10*3/uL (ref 0.0–0.1)
EOS PCT: 3 %
Eosinophils Absolute: 0.1 10*3/uL (ref 0.0–0.5)
HEMATOCRIT: 42 % (ref 36.0–46.0)
Hemoglobin: 12.9 g/dL (ref 12.0–15.0)
Immature Granulocytes: 0 %
Lymphocytes Relative: 40 %
Lymphs Abs: 1.6 10*3/uL (ref 0.7–4.0)
MCH: 24.6 pg — ABNORMAL LOW (ref 26.0–34.0)
MCHC: 30.7 g/dL (ref 30.0–36.0)
MCV: 80 fL (ref 80.0–100.0)
MONO ABS: 0.5 10*3/uL (ref 0.1–1.0)
Monocytes Relative: 12 %
NEUTROS ABS: 1.8 10*3/uL (ref 1.7–7.7)
Neutrophils Relative %: 44 %
Platelet Count: 323 10*3/uL (ref 150–400)
RBC: 5.25 MIL/uL — AB (ref 3.87–5.11)
RDW: 14.7 % (ref 11.5–15.5)
WBC: 4.1 10*3/uL (ref 4.0–10.5)
nRBC: 0 % (ref 0.0–0.2)

## 2018-09-16 LAB — LACTATE DEHYDROGENASE: LDH: 171 U/L (ref 98–192)

## 2018-09-16 NOTE — Telephone Encounter (Signed)
Appointments scheduled letter/calendar mailed per 2/18 los °

## 2018-09-16 NOTE — Patient Instructions (Signed)
Polycythemia Vera Polycythemia vera (PV), or myeloproliferative disease, is a form of blood cancer in which the bone marrow makes too many (overproduces) red blood cells. The bone marrow may also make too many clotting cells (platelets) and white blood cells. Bone marrow is the spongy center of bones where blood cells are produced. Sometimes, there may be an overproduction of blood cells in the liver and spleen, causing those organs to become enlarged. Additionally, people who have PV are at a higher risk for stroke or heart attack because their blood may clot more easily. PV is a long-term disease. What are the causes? Almost all people who have PV have an abnormal gene (genetic mutation) that causes changes in the way that the bone marrow makes blood cells. This gene, which is called JAK2, is not passed along from parent to child (is not hereditary). It is not known what triggers the genetic mutation that causes the body to produce too many red blood cells. What increases the risk? This condition is more likely to develop in:  Males.  People who are 74 years of age or older. What are the signs or symptoms? You may not have any symptoms in the early stage of PV. When symptoms develop, they may include:  Shortness of breath.  Dizziness.  Hot and flushed skin.  Itchy skin.  Sweats, especially night sweats.  Headache.  Tiredness.  Ringing in the ears.  Blurred vision or blind spots.  Bone pain.  Weight loss.  Fever.  Blood-tinged vomit or bowel movements. How is this diagnosed? This condition may be diagnosed during a routine physical exam if you have a blood test called a complete blood count (CBC). Your health care provider also may suspect PV if you have symptoms. During the physical exam, your provider may find that you have an enlarged liver or spleen. You may also have tests to confirm the diagnosis. These may include:  A procedure to remove a sample of bone marrow for  testing (bone marrow biopsy).  Blood tests to check for: ? The JAK2 gene. ? Low levels of a hormone that helps to regulate blood production (erythropoietin). How is this treated? There is no cure for PV, but treatment can help to control the disease. There are several types of treatment. No single treatment works for everyone. You will need to work with a blood cancer specialist (hematologist) to find the treatment that is best for you. Options include:  Periodically having some blood removed with a needle (drawn) to lower the number of red blood cells (phlebotomy).  Medicine. Your health care provider may recommend: ? Low-dose aspirin to lower your risk for blood clots. ? A medicine to reduce red blood cell production (hydroxyurea). ? A medicine to lower the number of red blood cells (interferon). ? A medicine that slows down the effects of JAK2 (ruxolitinib).  Follow these instructions at home:  Take over-the-counter and prescription medicines only as told by your health care provider.  Return to your normal activities as told by your health care provider. Ask your health care provider what activities are safe for you.  Do not use tobacco products, including cigarettes, chewing tobacco, or e-cigarettes. If you need help quitting, ask your health care provider.  Keep all follow-up visits as told by your health care provider. This is important. Contact a health care provider if:  You have side effects from your medicines.  Your symptoms change or get worse at home.  You have blood in your  stool or you vomit blood. Get help right away if:  You have sudden and severe pain in your abdomen.  You have chest pain or difficulty breathing.  You have signs of stroke, such as: ? Sudden numbness. ? Weakness of your face or arm. ? Confusion. ? Difficulty speaking or understanding speech. These symptoms may represent a serious problem that is an emergency. Do not wait to see if the  symptoms will go away. Get medical help right away. Call your local emergency services (911 in the [place] Do not drive yourself to the hospital. This information is not intended to replace advice given to you by your health care provider. Make sure you discuss any questions you have with your health care provider. Document Released: 04/10/2001 Document Revised: 12/22/2015 Document Reviewed: 01/26/2015 Elsevier Interactive Patient Education  2019 [employer].

## 2018-09-16 NOTE — Progress Notes (Signed)
Hematology and Oncology Follow Up Visit  Heather Brown [id number] 1946-03-29 73 y.o. 09/16/2018   Principle Diagnosis:   Polycythemia vera -- DNMT3A (+),  Epo = 5.7  Current Therapy:    Phlebotomy to maintain hematocrit below 45%  Aspirin 81 mg p.o. daily     Interim History:  Heather Brown is back for follow-up.  She is doing okay.  We last saw her back in November.  She made it through all the holidays without any problems.  She is worried about her fatty liver.  I told her that this is some that her family doctor deals with and that he can help with this problem and refer her if necessary to a gastroenterologist.  She was last phlebotomized back in November.  We had to make sure that she gets IV fluids after each phlebotomy.  Her iron studies back in November showed a ferritin of 7 with an iron saturation of 17%.  She is eating well.  She is having no problems with nausea or vomiting.  She has had no issues with bowels or bladder.  There is been no leg swelling.  Overall, her performance status is ECOG 1.    Medications:  Current Outpatient Medications:  .  aspirin 81 MG tablet, Take 81 mg by mouth daily., Disp: , Rfl:  .  CALCIUM CARBONATE ANTACID PO, Take 1,000 mg by mouth daily as needed for indigestion or heartburn. , Disp: , Rfl:  .  Cholecalciferol (VITAMIN D3) 1000 UNITS CAPS, Take 1,000 Units by mouth daily., Disp: , Rfl:  .  fish oil-omega-3 fatty acids 1000 MG capsule, Take 1 g by mouth daily. With 360 mg omega-3, Disp: , Rfl:  .  levothyroxine (SYNTHROID, LEVOTHROID) 50 MCG tablet, Takes 100 mcg once weekly and 50 mcg every other day., Disp: , Rfl: 4 .  loratadine (CLARITIN) 10 MG tablet, Take 10 mg by mouth daily as needed for allergies. , Disp: , Rfl:  .  Multiple Vitamin (MULTIVITAMIN) capsule, Take 1 capsule by mouth daily., Disp: , Rfl:  .  tretinoin (RETIN-A) 0.05 % cream, APPLY PEA SIZED AMOUNT TO AFFECTED AREA OF FACE NIGHTLY, Disp: , Rfl: 5 .  Biotin 2500  MCG CAPS, Take by mouth. Three times weekly, Disp: , Rfl:  .  triamcinolone ointment (KENALOG) 0.5 %, Apply 1 application topically 2 (two) times daily. (Patient not taking: Reported on 09/16/2018), Disp: 30 g, Rfl: 2  Allergies:  Allergies  Allergen Reactions  . Sulfa Antibiotics Hives    Past Medical History, Surgical history, Social history, and Family History were reviewed and updated.  Review of Systems: Review of Systems  HENT:  Negative.   Eyes: Negative.   Respiratory: Positive for shortness of breath.   Cardiovascular: Negative.   Gastrointestinal: Positive for abdominal distention.  Endocrine: Negative.   Musculoskeletal: Positive for arthralgias, flank pain, myalgias and neck stiffness.  Skin: Negative.   Neurological: Negative.   Hematological: Negative.   Psychiatric/Behavioral: Negative.     Physical Exam:  weight is 176 lb 12 oz (80.2 kg). Her oral temperature is 97.8 F (36.6 C). Her blood pressure is 122/66 and her pulse is 84. Her respiration is 20 and oxygen saturation is 100%.   Wt Readings from Last 3 Encounters:  09/16/18 176 lb 12 oz (80.2 kg)  06/17/18 173 lb 4 oz (78.6 kg)  04/14/18 169 lb (76.7 kg)    Physical Exam Vitals signs reviewed.  HENT:     Head: Normocephalic and atraumatic.  Eyes:     Pupils: Pupils are equal, round, and reactive to light.  Neck:     Musculoskeletal: Normal range of motion.  Cardiovascular:     Rate and Rhythm: Normal rate and regular rhythm.     Heart sounds: Normal heart sounds.  Pulmonary:     Effort: Pulmonary effort is normal.     Breath sounds: Normal breath sounds.  Abdominal:     General: Bowel sounds are normal.     Palpations: Abdomen is soft.  Musculoskeletal: Normal range of motion.        General: No tenderness or deformity.  Lymphadenopathy:     Cervical: No cervical adenopathy.  Skin:    General: Skin is warm and dry.     Findings: No erythema or rash.  Neurological:     Mental Status: She  is alert and oriented to person, place, and time.  Psychiatric:        Behavior: Behavior normal.        Thought Content: Thought content normal.        Judgment: Judgment normal.      Lab Results  Component Value Date   WBC 4.1 09/16/2018   HGB 12.9 09/16/2018   HCT 42.0 09/16/2018   MCV 80.0 09/16/2018   PLT 323 09/16/2018     Chemistry      Component Value Date/Time   NA 142 09/16/2018 1004   NA 140 08/19/2017 1517   K 4.2 09/16/2018 1004   CL 108 09/16/2018 1004   CO2 28 09/16/2018 1004   BUN 16 09/16/2018 1004   BUN 15 08/19/2017 1517   CREATININE 0.81 09/16/2018 1004   CREATININE 0.79 03/10/2014 0836      Component Value Date/Time   CALCIUM 10.1 09/16/2018 1004   ALKPHOS 130 (H) 09/16/2018 1004   AST 26 09/16/2018 1004   ALT 33 09/16/2018 1004   BILITOT 0.6 09/16/2018 1004         Impression and Plan: Heather Brown is a 73 year old white female.  She has polycythemia vera.    I will get her back in 4 months.  I think in 4 months we might need to phlebotomize her.  We will have to see how her blood counts go.  I would think that with her iron levels being low her chance of mounting an erythrocytotic response will be limited.     [name], MD 2/18/202011:05 AM

## 2018-09-17 LAB — IRON AND TIBC
Iron: 41 ug/dL (ref 41–142)
Saturation Ratios: 10 % — ABNORMAL LOW (ref 21–57)
TIBC: 425 ug/dL (ref 236–444)
UIBC: 384 ug/dL (ref 120–384)

## 2018-09-17 LAB — FERRITIN: FERRITIN: 6 ng/mL — AB (ref 11–307)

## 2018-09-19 ENCOUNTER — Ambulatory Visit: Payer: Medicare Other | Admitting: Obstetrics & Gynecology

## 2018-09-25 IMAGING — US US ABDOMEN COMPLETE
1 series · 13 of 25 positions shown · non-contrast
Comparison: None in PACs

CLINICAL DATA: Abdominal and pelvic discomfort for the past 2
months. Family history of renal malignancy neck field

EXAM:
ABDOMEN ULTRASOUND COMPLETE

[Series 1: us abdomen complete · 0.17mm/px · 13 of 98 slices shown]
[im 1/98]
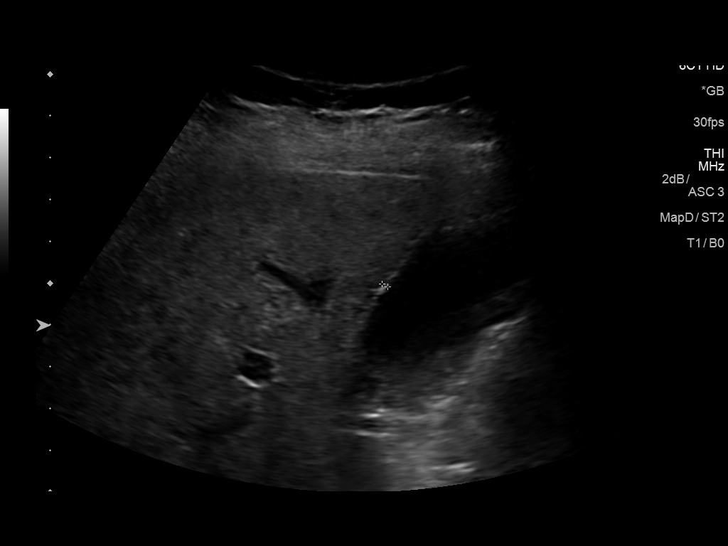
[im 9/98]
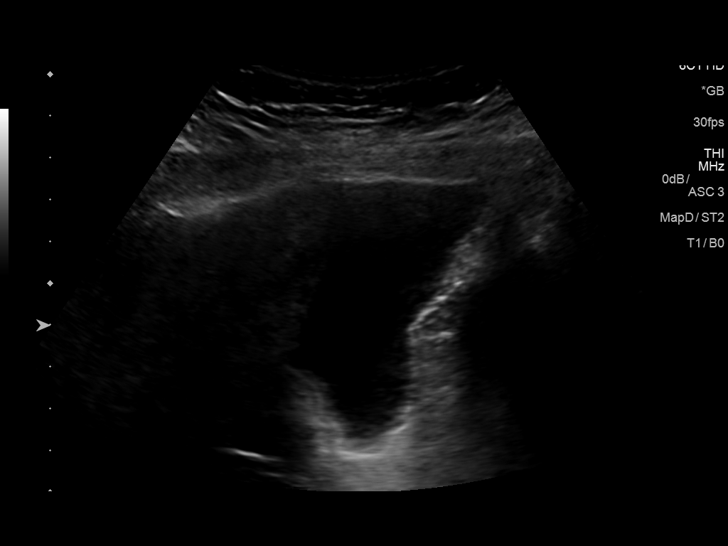
[im 17/98]
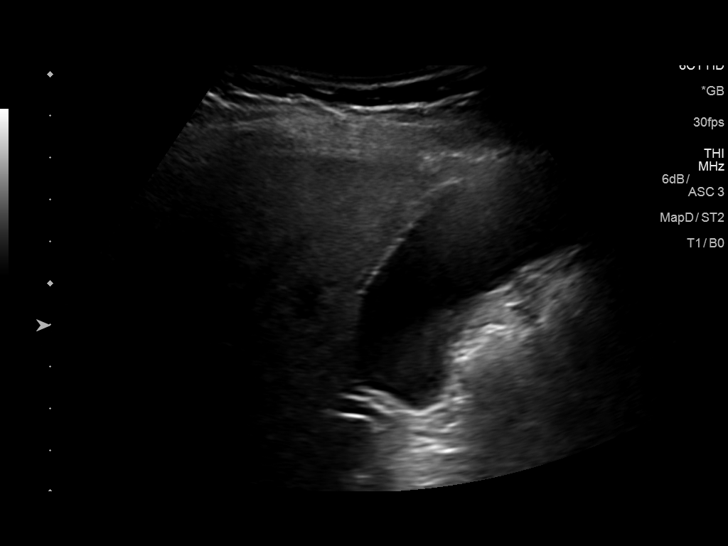
[im 25/98]
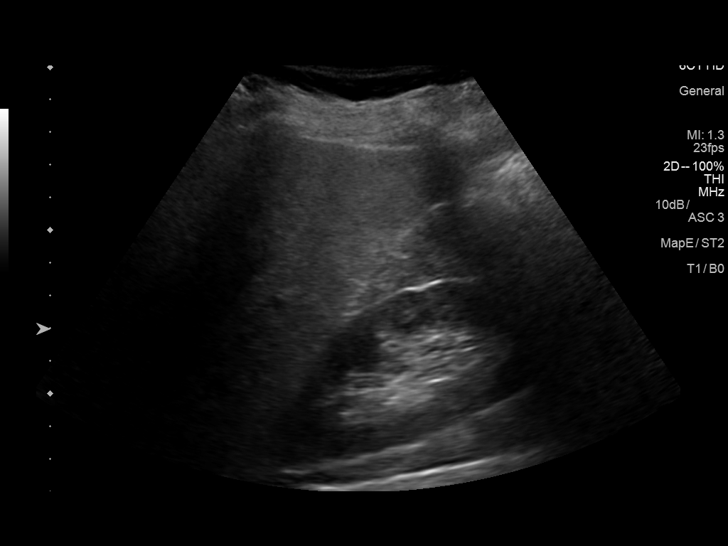
[im 33/98]
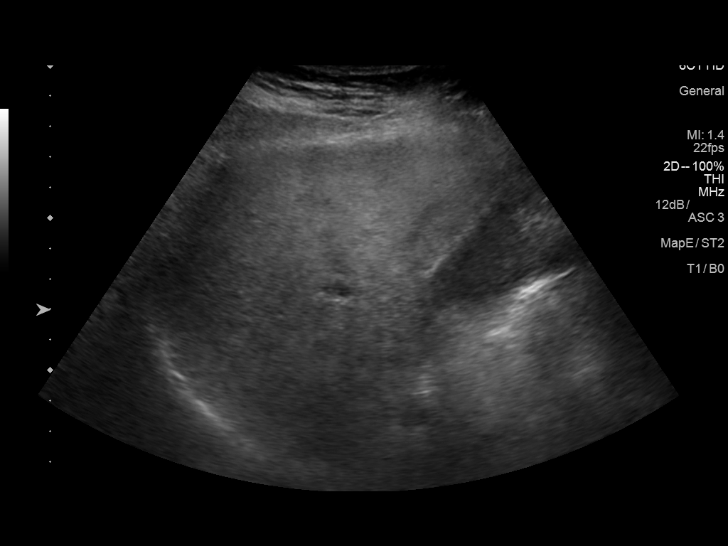
[im 41/98]
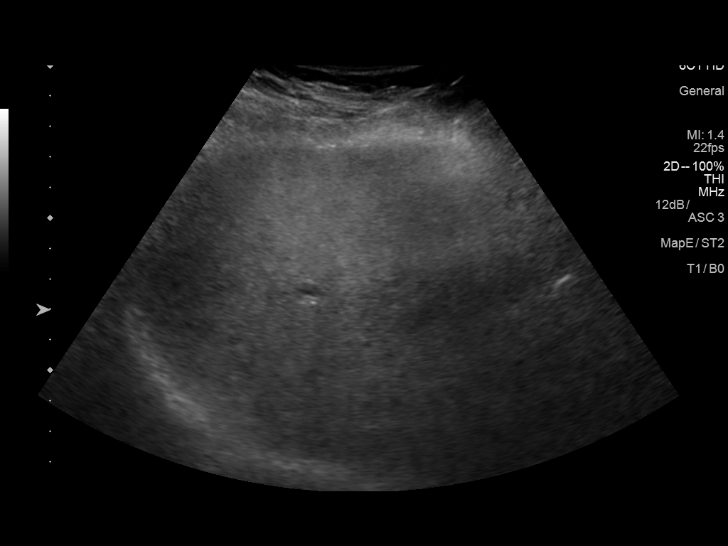
[im 49/98]
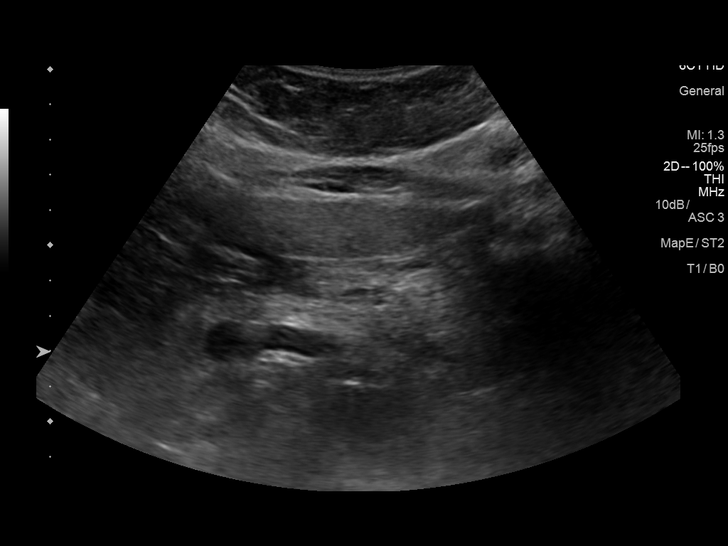
[im 57/98]
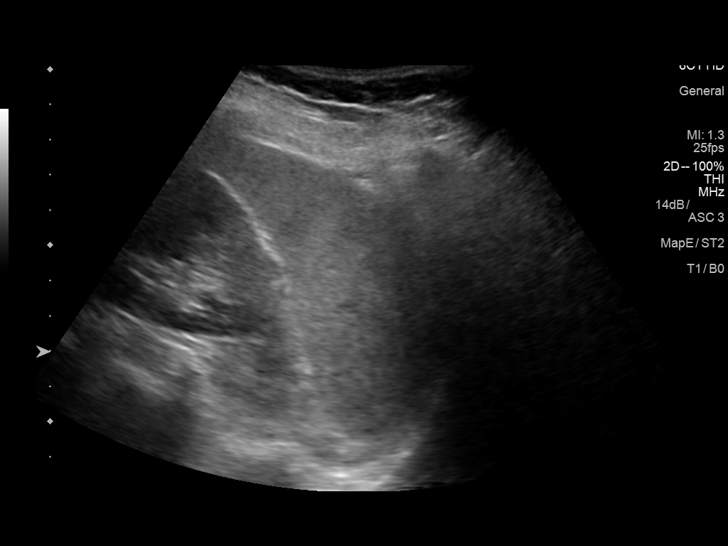
[im 65/98]
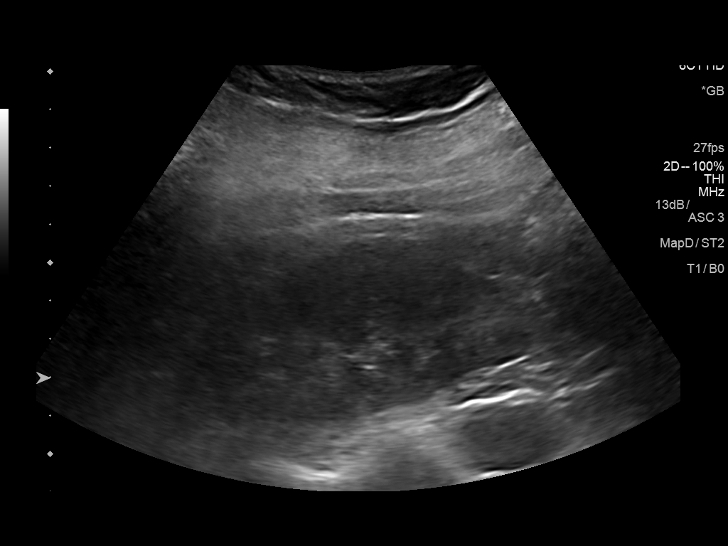
[im 73/98]
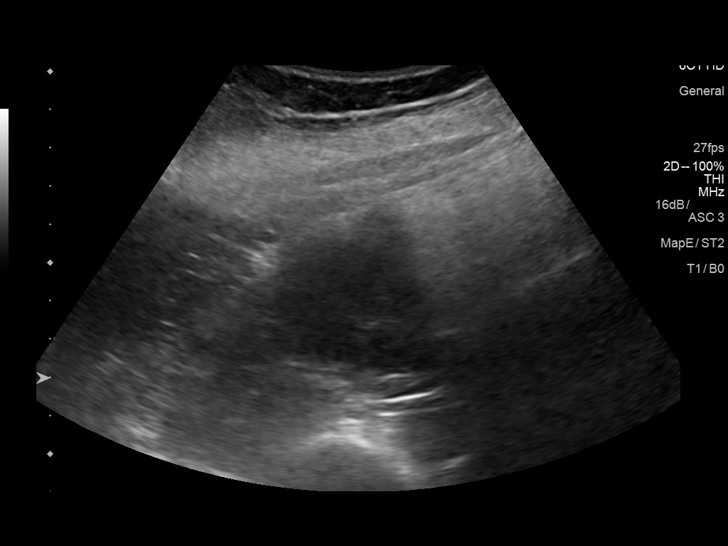
[im 81/98]
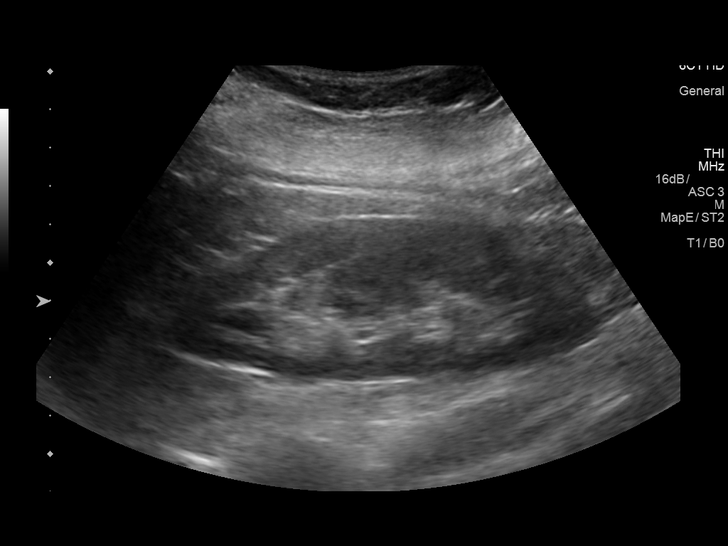
[im 89/98]
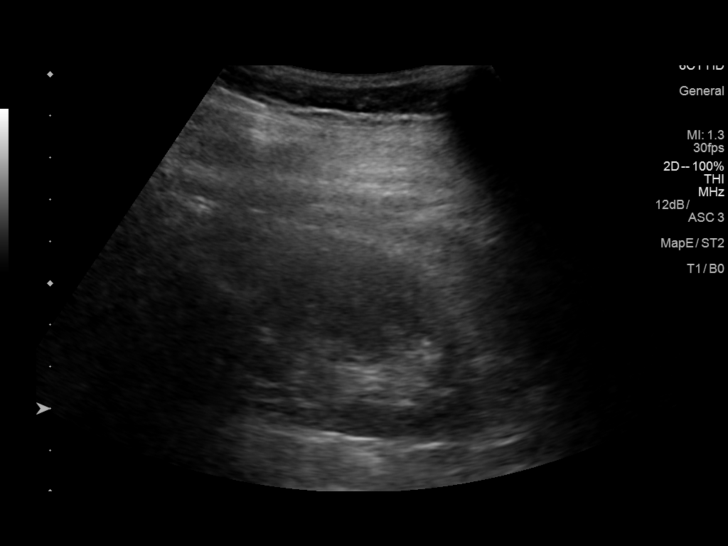
[im 98/98]
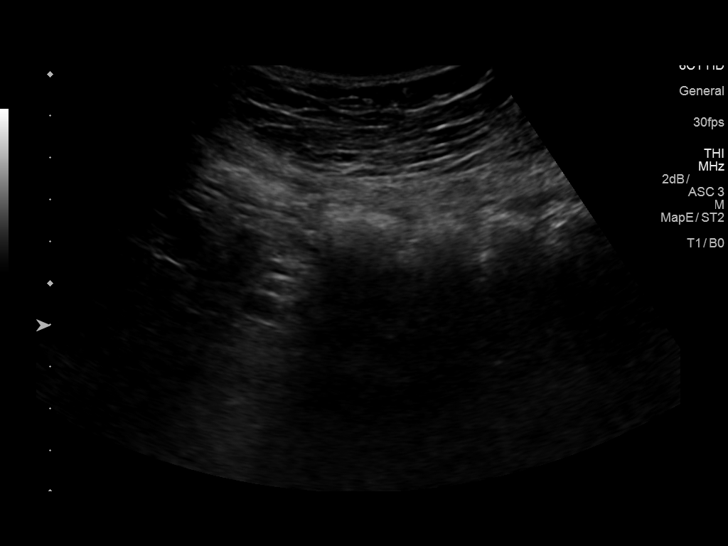

[13 of 25 positions shown; findings below may reference images not displayed]

FINDINGS: Gallbladder: No gallstones or wall thickening visualized. No
sonographic Murphy sign noted by sonographer.

Common bile duct: Diameter: 3.9 mm

Liver: The hepatic echotexture is mildly increased diffusely. There
is no focal mass nor ductal dilation.

IVC: No abnormality visualized.

Pancreas: Visualized portion unremarkable.

Spleen: The spleen is normal in size and echotexture.

Right Kidney: Length: 10.7 cm. Echogenicity within normal limits. No
mass or hydronephrosis visualized.

Left Kidney: Length: 11.3 cm. Echogenicity within normal limits. No
mass or hydronephrosis visualized.

Abdominal aorta: Bowel gas limits evaluation of the abdominal aorta.
No definite aneurysm is observed.

Other findings: There is no ascites.
IMPRESSION: The kidneys are normal in echotexture, size, and contour. There is
no hydronephrosis nor evidence of a renal mass.

Increased hepatic echotexture compatible with fatty infiltrative
change. No gallstones or sonographic evidence of acute
cholecystitis.

Normal appearance of the pancreas and spleen. The IVC and abdominal
aorta are normal where visualized.

## 2019-01-07 DIAGNOSIS — H40013 Open angle with borderline findings, low risk, bilateral: Secondary | ICD-10-CM | POA: Diagnosis not present

## 2019-01-07 DIAGNOSIS — H15002 Unspecified scleritis, left eye: Secondary | ICD-10-CM | POA: Diagnosis not present

## 2019-01-07 DIAGNOSIS — H04123 Dry eye syndrome of bilateral lacrimal glands: Secondary | ICD-10-CM | POA: Diagnosis not present

## 2019-01-07 DIAGNOSIS — H11153 Pinguecula, bilateral: Secondary | ICD-10-CM | POA: Diagnosis not present

## 2019-01-15 ENCOUNTER — Encounter: Payer: Self-pay | Admitting: Hematology & Oncology

## 2019-01-15 ENCOUNTER — Other Ambulatory Visit: Payer: Self-pay

## 2019-01-15 ENCOUNTER — Inpatient Hospital Stay: Payer: Medicare Other | Attending: Hematology & Oncology

## 2019-01-15 ENCOUNTER — Telehealth: Payer: Self-pay | Admitting: Hematology & Oncology

## 2019-01-15 ENCOUNTER — Inpatient Hospital Stay: Payer: Medicare Other

## 2019-01-15 ENCOUNTER — Inpatient Hospital Stay (HOSPITAL_BASED_OUTPATIENT_CLINIC_OR_DEPARTMENT_OTHER): Payer: Medicare Other | Admitting: Hematology & Oncology

## 2019-01-15 VITALS — BP 129/73 | HR 86 | Temp 98.0°F | Resp 18 | Wt 173.0 lb

## 2019-01-15 DIAGNOSIS — D45 Polycythemia vera: Secondary | ICD-10-CM

## 2019-01-15 DIAGNOSIS — Z7982 Long term (current) use of aspirin: Secondary | ICD-10-CM

## 2019-01-15 LAB — CBC WITH DIFFERENTIAL (CANCER CENTER ONLY)
Abs Immature Granulocytes: 0.01 10*3/uL (ref 0.00–0.07)
Basophils Absolute: 0 10*3/uL (ref 0.0–0.1)
Basophils Relative: 1 %
Eosinophils Absolute: 0.1 10*3/uL (ref 0.0–0.5)
Eosinophils Relative: 4 %
HCT: 43.3 % (ref 36.0–46.0)
Hemoglobin: 13.2 g/dL (ref 12.0–15.0)
Immature Granulocytes: 0 %
Lymphocytes Relative: 34 %
Lymphs Abs: 1.2 10*3/uL (ref 0.7–4.0)
MCH: 24.1 pg — ABNORMAL LOW (ref 26.0–34.0)
MCHC: 30.5 g/dL (ref 30.0–36.0)
MCV: 79 fL — ABNORMAL LOW (ref 80.0–100.0)
Monocytes Absolute: 0.5 10*3/uL (ref 0.1–1.0)
Monocytes Relative: 15 %
Neutro Abs: 1.6 10*3/uL — ABNORMAL LOW (ref 1.7–7.7)
Neutrophils Relative %: 46 %
Platelet Count: 289 10*3/uL (ref 150–400)
RBC: 5.48 MIL/uL — ABNORMAL HIGH (ref 3.87–5.11)
RDW: 16.4 % — ABNORMAL HIGH (ref 11.5–15.5)
WBC Count: 3.4 10*3/uL — ABNORMAL LOW (ref 4.0–10.5)
nRBC: 0 % (ref 0.0–0.2)

## 2019-01-15 LAB — CMP (CANCER CENTER ONLY)
ALT: 28 U/L (ref 0–44)
AST: 23 U/L (ref 15–41)
Albumin: 4 g/dL (ref 3.5–5.0)
Alkaline Phosphatase: 120 U/L (ref 38–126)
Anion gap: 5 (ref 5–15)
BUN: 19 mg/dL (ref 8–23)
CO2: 27 mmol/L (ref 22–32)
Calcium: 10.4 mg/dL — ABNORMAL HIGH (ref 8.9–10.3)
Chloride: 107 mmol/L (ref 98–111)
Creatinine: 0.74 mg/dL (ref 0.44–1.00)
GFR, Est AFR Am: >60 mL/min (ref >60)
GFR, Estimated: >60 mL/min (ref >60)
Glucose, Bld: 100 mg/dL — ABNORMAL HIGH (ref 70–99)
Potassium: 4.1 mmol/L (ref 3.5–5.1)
Sodium: 139 mmol/L (ref 135–145)
Total Bilirubin: 0.8 mg/dL (ref 0.3–1.2)
Total Protein: 6.6 g/dL (ref 6.5–8.1)

## 2019-01-15 LAB — IRON AND TIBC
Iron: 50 ug/dL (ref 41–142)
Saturation Ratios: 11 % — ABNORMAL LOW (ref 21–57)
TIBC: 443 ug/dL (ref 236–444)
UIBC: 393 ug/dL — ABNORMAL HIGH (ref 120–384)

## 2019-01-15 LAB — SAVE SMEAR(SSMR), FOR PROVIDER SLIDE REVIEW

## 2019-01-15 LAB — FERRITIN: Ferritin: <4 ng/mL — ABNORMAL LOW (ref 11–307)

## 2019-01-15 NOTE — Telephone Encounter (Signed)
Called and spoke with patient regarding appointments added per 6/18 los.  She requested a Wednesday appt forr week after 9/18

## 2019-01-15 NOTE — Progress Notes (Signed)
Hematology and Oncology Follow Up Visit  Heather Brown [id number] 1945-08-02 73 y.o. 01/15/2019   Principle Diagnosis:   Polycythemia vera -- DNMT3A (+),  Epo = 5.7  Current Therapy:    Phlebotomy to maintain hematocrit below 45%  Aspirin 81 mg p.o. daily     Interim History:  Heather Brown is back for follow-up.  She is doing okay.  Problem is she injured her big toe on the right foot.  She says she fell.  It does not look like it is broken.  I believe the toe around.  It is swollen and a little red.  I told her that she could probably take some ibuprofen for this.  She is on a baby aspirin.  As long she takes the ibuprofen with food I think we will be okay.  Otherwise she has had no problems.  She is got through the coronavirus and like down.  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  She cannot stand her hair very long.  She has had no issues with nausea or vomiting.  She has had no fever.  There is been no leg swelling.    Overall, her performance status is ECOG 1.    Medications:  Current Outpatient Medications:  .  aspirin 81 MG tablet, Take 81 mg by mouth daily., Disp: , Rfl:  .  Biotin 2500 MCG CAPS, Take by mouth. Three times weekly, Disp: , Rfl:  .  CALCIUM CARBONATE ANTACID PO, Take 1,000 mg by mouth daily as needed for indigestion or heartburn. , Disp: , Rfl:  .  Cholecalciferol (VITAMIN D3) 1000 UNITS CAPS, Take 1,000 Units by mouth daily., Disp: , Rfl:  .  fish oil-omega-3 fatty acids 1000 MG capsule, Take 1 g by mouth daily. With 360 mg omega-3, Disp: , Rfl:  .  levothyroxine (SYNTHROID, LEVOTHROID) 50 MCG tablet, Takes 100 mcg once weekly and 50 mcg every other day., Disp: , Rfl: 4 .  loratadine (CLARITIN) 10 MG tablet, Take 10 mg by mouth daily as needed for allergies. , Disp: , Rfl:  .  Multiple Vitamin (MULTIVITAMIN) capsule, Take 1 capsule by mouth daily., Disp: , Rfl:  .  tretinoin (RETIN-A) 0.05 % cream, APPLY PEA SIZED AMOUNT TO AFFECTED AREA OF FACE NIGHTLY, Disp: , Rfl: 5  .  triamcinolone ointment (KENALOG) 0.5 %, Apply 1 application topically 2 (two) times daily. (Patient not taking: Reported on 09/16/2018), Disp: 30 g, Rfl: 2  Allergies:  Allergies  Allergen Reactions  . Sulfa Antibiotics Hives    Past Medical History, Surgical history, Social history, and Family History were reviewed and updated.  Review of Systems: Review of Systems  HENT:  Negative.   Eyes: Negative.   Respiratory: Positive for shortness of breath.   Cardiovascular: Negative.   Gastrointestinal: Positive for abdominal distention.  Endocrine: Negative.   Musculoskeletal: Positive for arthralgias, flank pain, myalgias and neck stiffness.  Skin: Negative.   Neurological: Negative.   Hematological: Negative.   Psychiatric/Behavioral: Negative.     Physical Exam:  weight is 173 lb (78.5 kg). Her oral temperature is 98 F (36.7 C). Her blood pressure is 129/73 and her pulse is 86. Her respiration is 18 and oxygen saturation is 100%.   Wt Readings from Last 3 Encounters:  01/15/19 173 lb (78.5 kg)  09/16/18 176 lb 12 oz (80.2 kg)  06/17/18 173 lb 4 oz (78.6 kg)    Physical Exam Vitals signs reviewed.  HENT:     Head: Normocephalic and atraumatic.  Eyes:     Pupils: Pupils are equal, round, and reactive to light.  Neck:     Musculoskeletal: Normal range of motion.  Cardiovascular:     Rate and Rhythm: Normal rate and regular rhythm.     Heart sounds: Normal heart sounds.  Pulmonary:     Effort: Pulmonary effort is normal.     Breath sounds: Normal breath sounds.  Abdominal:     General: Bowel sounds are normal.     Palpations: Abdomen is soft.  Musculoskeletal: Normal range of motion.        General: No tenderness or deformity.     Comments: Her big toe on her right foot is a little swollen.  There is some erythema associated with this.  She does have decent range of motion.  There is no tenderness to palpation over the area of erythema.  Lymphadenopathy:      Cervical: No cervical adenopathy.  Skin:    General: Skin is warm and dry.     Findings: No erythema or rash.  Neurological:     Mental Status: She is alert and oriented to person, place, and time.  Psychiatric:        Behavior: Behavior normal.        Thought Content: Thought content normal.        Judgment: Judgment normal.      Lab Results  Component Value Date   WBC 3.4 (L) 01/15/2019   HGB 13.2 01/15/2019   HCT 43.3 01/15/2019   MCV 79.0 (L) 01/15/2019   PLT 289 01/15/2019     Chemistry      Component Value Date/Time   NA 139 01/15/2019 0901   NA 140 08/19/2017 1517   K 4.1 01/15/2019 0901   CL 107 01/15/2019 0901   CO2 27 01/15/2019 0901   BUN 19 01/15/2019 0901   BUN 15 08/19/2017 1517   CREATININE 0.74 01/15/2019 0901   CREATININE 0.79 03/10/2014 0836      Component Value Date/Time   CALCIUM 10.4 (H) 01/15/2019 0901   ALKPHOS 120 01/15/2019 0901   AST 23 01/15/2019 0901   ALT 28 01/15/2019 0901   BILITOT 0.8 01/15/2019 0901         Impression and Plan: Heather Brown is a 73 year old white female.  She has polycythemia vera.    I do not think she is a phlebotomy today.  I would like to get her back in 3 months.  Hopefully, her toe will get better.  If not, I am sure her family doctor will help her out.  [name], MD 6/18/202010:08 AM

## 2019-04-17 ENCOUNTER — Other Ambulatory Visit: Payer: Medicare Other

## 2019-04-17 ENCOUNTER — Ambulatory Visit: Payer: Medicare Other | Admitting: Hematology & Oncology

## 2019-04-22 ENCOUNTER — Encounter: Payer: Self-pay | Admitting: Hematology & Oncology

## 2019-04-22 ENCOUNTER — Inpatient Hospital Stay (HOSPITAL_BASED_OUTPATIENT_CLINIC_OR_DEPARTMENT_OTHER): Payer: Medicare Other | Admitting: Hematology & Oncology

## 2019-04-22 ENCOUNTER — Other Ambulatory Visit: Payer: Self-pay

## 2019-04-22 ENCOUNTER — Inpatient Hospital Stay: Payer: Medicare Other | Attending: Hematology & Oncology

## 2019-04-22 ENCOUNTER — Telehealth: Payer: Self-pay | Admitting: Hematology & Oncology

## 2019-04-22 ENCOUNTER — Inpatient Hospital Stay: Payer: Medicare Other

## 2019-04-22 VITALS — BP 144/84 | HR 82 | Temp 97.9°F | Resp 20 | Wt 173.8 lb

## 2019-04-22 DIAGNOSIS — D45 Polycythemia vera: Secondary | ICD-10-CM | POA: Diagnosis not present

## 2019-04-22 DIAGNOSIS — Z7982 Long term (current) use of aspirin: Secondary | ICD-10-CM | POA: Insufficient documentation

## 2019-04-22 LAB — CMP (CANCER CENTER ONLY)
ALT: 40 U/L (ref 0–44)
AST: 29 U/L (ref 15–41)
Albumin: 4 g/dL (ref 3.5–5.0)
Alkaline Phosphatase: 119 U/L (ref 38–126)
Anion gap: 7 (ref 5–15)
BUN: 17 mg/dL (ref 8–23)
CO2: 28 mmol/L (ref 22–32)
Calcium: 10.3 mg/dL (ref 8.9–10.3)
Chloride: 106 mmol/L (ref 98–111)
Creatinine: 0.82 mg/dL (ref 0.44–1.00)
GFR, Est AFR Am: >60 mL/min (ref >60)
GFR, Estimated: >60 mL/min (ref >60)
Glucose, Bld: 141 mg/dL — ABNORMAL HIGH (ref 70–99)
Potassium: 4 mmol/L (ref 3.5–5.1)
Sodium: 141 mmol/L (ref 135–145)
Total Bilirubin: 0.6 mg/dL (ref 0.3–1.2)
Total Protein: 7 g/dL (ref 6.5–8.1)

## 2019-04-22 LAB — CBC WITH DIFFERENTIAL (CANCER CENTER ONLY)
Abs Immature Granulocytes: 0.01 10*3/uL (ref 0.00–0.07)
Basophils Absolute: 0 10*3/uL (ref 0.0–0.1)
Basophils Relative: 1 %
Eosinophils Absolute: 0.2 10*3/uL (ref 0.0–0.5)
Eosinophils Relative: 5 %
HCT: 43.9 % (ref 36.0–46.0)
Hemoglobin: 13.5 g/dL (ref 12.0–15.0)
Immature Granulocytes: 0 %
Lymphocytes Relative: 35 %
Lymphs Abs: 1.5 10*3/uL (ref 0.7–4.0)
MCH: 24.9 pg — ABNORMAL LOW (ref 26.0–34.0)
MCHC: 30.8 g/dL (ref 30.0–36.0)
MCV: 81 fL (ref 80.0–100.0)
Monocytes Absolute: 0.6 10*3/uL (ref 0.1–1.0)
Monocytes Relative: 13 %
Neutro Abs: 2.1 10*3/uL (ref 1.7–7.7)
Neutrophils Relative %: 46 %
Platelet Count: 274 10*3/uL (ref 150–400)
RBC: 5.42 MIL/uL — ABNORMAL HIGH (ref 3.87–5.11)
RDW: 16.5 % — ABNORMAL HIGH (ref 11.5–15.5)
WBC Count: 4.4 10*3/uL (ref 4.0–10.5)
nRBC: 0 % (ref 0.0–0.2)

## 2019-04-22 LAB — FERRITIN: Ferritin: 6 ng/mL — ABNORMAL LOW (ref 11–307)

## 2019-04-22 LAB — IRON AND TIBC
Iron: 48 ug/dL (ref 41–142)
Saturation Ratios: 12 % — ABNORMAL LOW (ref 21–57)
TIBC: 414 ug/dL (ref 236–444)
UIBC: 365 ug/dL (ref 120–384)

## 2019-04-22 NOTE — Telephone Encounter (Signed)
Spoke with patient to confirm Nov appts per 9/23 LOS

## 2019-04-22 NOTE — Progress Notes (Signed)
Hematology and Oncology Follow Up Visit  Heather Brown [id number] 1946-06-13 73 y.o. 04/22/2019   Principle Diagnosis:   Polycythemia vera -- DNMT3A (+),  Epo = 5.7  Current Therapy:    Phlebotomy to maintain hematocrit below 45%  Aspirin 81 mg p.o. daily     Interim History:  Heather Brown is back for follow-up.  So far, everything is going pretty well with Heather Brown.  She has had no problems over the summertime.  Heather Brown toe is doing a lot better that she hurt.  She is had no problems with bleeding.  There is no issues with bowel or bladder changes.  She has had no cough.  There is been no headache.  Heather Brown iron studies have been quite low.  When we last saw Heather Brown, Heather Brown ferritin was less than 4 with an iron saturation of 11%.  She has had no fever.  She unfortunately has not been able to go on vacation because of the coronavirus.  Overall, Heather Brown performance status is ECOG 1.      Medications:  Current Outpatient Medications:  .  aspirin 81 MG tablet, Take 81 mg by mouth daily., Disp: , Rfl:  .  Biotin 2500 MCG CAPS, Take by mouth. Three times weekly, Disp: , Rfl:  .  CALCIUM CARBONATE ANTACID PO, Take 1,000 mg by mouth daily as needed for indigestion or heartburn. , Disp: , Rfl:  .  Cholecalciferol (VITAMIN D3) 1000 UNITS CAPS, Take 1,000 Units by mouth daily., Disp: , Rfl:  .  fish oil-omega-3 fatty acids 1000 MG capsule, Take 1 g by mouth daily. With 360 mg omega-3, Disp: , Rfl:  .  glucosamine-chondroitin 500-400 MG tablet, Take 1 tablet by mouth daily., Disp: , Rfl:  .  levothyroxine (SYNTHROID) 75 MCG tablet, 75 mcg daily., Disp: , Rfl:  .  loratadine (CLARITIN) 10 MG tablet, Take 10 mg by mouth daily as needed for allergies. , Disp: , Rfl:  .  Multiple Vitamin (MULTIVITAMIN) capsule, Take 1 capsule by mouth daily., Disp: , Rfl:  .  tretinoin (RETIN-A) 0.05 % cream, APPLY PEA SIZED AMOUNT TO AFFECTED AREA OF FACE NIGHTLY, Disp: , Rfl: 5 .  levothyroxine (SYNTHROID, LEVOTHROID) 50 MCG  tablet, Takes 100 mcg once weekly and 50 mcg every other day., Disp: , Rfl: 4 .  triamcinolone ointment (KENALOG) 0.5 %, Apply 1 application topically 2 (two) times daily. (Patient not taking: Reported on 09/16/2018), Disp: 30 g, Rfl: 2  Allergies:  Allergies  Allergen Reactions  . Sulfa Antibiotics Hives    Past Medical History, Surgical history, Social history, and Family History were reviewed and updated.  Review of Systems: Review of Systems  HENT:  Negative.   Eyes: Negative.   Respiratory: Positive for shortness of breath.   Cardiovascular: Negative.   Gastrointestinal: Positive for abdominal distention.  Endocrine: Negative.   Musculoskeletal: Positive for arthralgias, flank pain, myalgias and neck stiffness.  Skin: Negative.   Neurological: Negative.   Hematological: Negative.   Psychiatric/Behavioral: Negative.     Physical Exam:  weight is 173 lb 12.8 oz (78.8 kg). Heather Brown oral temperature is 97.9 F (36.6 C). Heather Brown blood pressure is 144/84 (abnormal) and Heather Brown pulse is 82. Heather Brown respiration is 20 and oxygen saturation is 100%.   Wt Readings from Last 3 Encounters:  04/22/19 173 lb 12.8 oz (78.8 kg)  01/15/19 173 lb (78.5 kg)  09/16/18 176 lb 12 oz (80.2 kg)    Physical Exam Vitals signs reviewed.  HENT:  Head: Normocephalic and atraumatic.  Eyes:     Pupils: Pupils are equal, round, and reactive to light.  Neck:     Musculoskeletal: Normal range of motion.  Cardiovascular:     Rate and Rhythm: Normal rate and regular rhythm.     Heart sounds: Normal heart sounds.  Pulmonary:     Effort: Pulmonary effort is normal.     Breath sounds: Normal breath sounds.  Abdominal:     General: Bowel sounds are normal.     Palpations: Abdomen is soft.  Musculoskeletal: Normal range of motion.        General: No tenderness or deformity.     Comments: Heather Brown big toe on Heather Brown right foot is a little swollen.  There is some erythema associated with this.  She does have decent range of  motion.  There is no tenderness to palpation over the area of erythema.  Lymphadenopathy:     Cervical: No cervical adenopathy.  Skin:    General: Skin is warm and dry.     Findings: No erythema or rash.  Neurological:     Mental Status: She is alert and oriented to person, place, and time.  Psychiatric:        Behavior: Behavior normal.        Thought Content: Thought content normal.        Judgment: Judgment normal.      Lab Results  Component Value Date   WBC 4.4 04/22/2019   HGB 13.5 04/22/2019   HCT 43.9 04/22/2019   MCV 81.0 04/22/2019   PLT 274 04/22/2019     Chemistry      Component Value Date/Time   NA 141 04/22/2019 0904   NA 140 08/19/2017 1517   K 4.0 04/22/2019 0904   CL 106 04/22/2019 0904   CO2 28 04/22/2019 0904   BUN 17 04/22/2019 0904   BUN 15 08/19/2017 1517   CREATININE 0.82 04/22/2019 0904   CREATININE 0.79 03/10/2014 0836      Component Value Date/Time   CALCIUM 10.3 04/22/2019 0904   ALKPHOS 119 04/22/2019 0904   AST 29 04/22/2019 0904   ALT 40 04/22/2019 0904   BILITOT 0.6 04/22/2019 0904       Impression and Plan: Ms. Hagerty is a 73 year old white female.  She has polycythemia vera.    I do not think she needs a phlebotomy today.  I would like to get Heather Brown back  before the holidays.  I want to make sure that everything is okay and that she feels well.    [name], MD 9/23/202010:02 AM

## 2019-06-17 ENCOUNTER — Inpatient Hospital Stay: Payer: Medicare Other

## 2019-06-17 ENCOUNTER — Telehealth: Payer: Self-pay | Admitting: Hematology & Oncology

## 2019-06-17 ENCOUNTER — Inpatient Hospital Stay: Payer: Medicare Other | Attending: Hematology & Oncology | Admitting: Hematology & Oncology

## 2019-06-17 ENCOUNTER — Encounter: Payer: Self-pay | Admitting: Hematology & Oncology

## 2019-06-17 ENCOUNTER — Other Ambulatory Visit: Payer: Self-pay

## 2019-06-17 VITALS — BP 112/84 | HR 75 | Temp 97.6°F | Resp 20 | Wt 173.8 lb

## 2019-06-17 VITALS — BP 114/73 | HR 78 | Resp 17

## 2019-06-17 DIAGNOSIS — D45 Polycythemia vera: Secondary | ICD-10-CM | POA: Diagnosis not present

## 2019-06-17 DIAGNOSIS — Z7982 Long term (current) use of aspirin: Secondary | ICD-10-CM | POA: Insufficient documentation

## 2019-06-17 DIAGNOSIS — M79606 Pain in leg, unspecified: Secondary | ICD-10-CM | POA: Diagnosis not present

## 2019-06-17 LAB — CBC WITH DIFFERENTIAL (CANCER CENTER ONLY)
Abs Immature Granulocytes: 0 10*3/uL (ref 0.00–0.07)
Basophils Absolute: 0 10*3/uL (ref 0.0–0.1)
Basophils Relative: 1 %
Eosinophils Absolute: 0.2 10*3/uL (ref 0.0–0.5)
Eosinophils Relative: 4 %
HCT: 43.4 % (ref 36.0–46.0)
Hemoglobin: 13.5 g/dL (ref 12.0–15.0)
Immature Granulocytes: 0 %
Lymphocytes Relative: 39 %
Lymphs Abs: 1.5 10*3/uL (ref 0.7–4.0)
MCH: 25.4 pg — ABNORMAL LOW (ref 26.0–34.0)
MCHC: 31.1 g/dL (ref 30.0–36.0)
MCV: 81.6 fL (ref 80.0–100.0)
Monocytes Absolute: 0.7 10*3/uL (ref 0.1–1.0)
Monocytes Relative: 16 %
Neutro Abs: 1.6 10*3/uL — ABNORMAL LOW (ref 1.7–7.7)
Neutrophils Relative %: 40 %
Platelet Count: 281 10*3/uL (ref 150–400)
RBC: 5.32 MIL/uL — ABNORMAL HIGH (ref 3.87–5.11)
RDW: 16.1 % — ABNORMAL HIGH (ref 11.5–15.5)
WBC Count: 4 10*3/uL (ref 4.0–10.5)
nRBC: 0 % (ref 0.0–0.2)

## 2019-06-17 LAB — CMP (CANCER CENTER ONLY)
ALT: 35 U/L (ref 0–44)
AST: 28 U/L (ref 15–41)
Albumin: 4.1 g/dL (ref 3.5–5.0)
Alkaline Phosphatase: 108 U/L (ref 38–126)
Anion gap: 6 (ref 5–15)
BUN: 20 mg/dL (ref 8–23)
CO2: 27 mmol/L (ref 22–32)
Calcium: 10.3 mg/dL (ref 8.9–10.3)
Chloride: 108 mmol/L (ref 98–111)
Creatinine: 0.74 mg/dL (ref 0.44–1.00)
GFR, Est AFR Am: >60 mL/min (ref >60)
GFR, Estimated: >60 mL/min (ref >60)
Glucose, Bld: 102 mg/dL — ABNORMAL HIGH (ref 70–99)
Potassium: 4.2 mmol/L (ref 3.5–5.1)
Sodium: 141 mmol/L (ref 135–145)
Total Bilirubin: 0.8 mg/dL (ref 0.3–1.2)
Total Protein: 6.2 g/dL — ABNORMAL LOW (ref 6.5–8.1)

## 2019-06-17 LAB — IRON AND TIBC
Iron: 58 ug/dL (ref 41–142)
Saturation Ratios: 14 % — ABNORMAL LOW (ref 21–57)
TIBC: 413 ug/dL (ref 236–444)
UIBC: 355 ug/dL (ref 120–384)

## 2019-06-17 LAB — ABO/RH: ABO/RH(D): AB POS

## 2019-06-17 LAB — FERRITIN: Ferritin: 7 ng/mL — ABNORMAL LOW (ref 11–307)

## 2019-06-17 LAB — TYPE AND SCREEN
ABO/RH(D): AB POS
Antibody Screen: NEGATIVE

## 2019-06-17 MED ORDER — SODIUM CHLORIDE 0.9 % IV SOLN
INTRAVENOUS | Status: AC
  Administered 2019-06-17: 12:00:00 via INTRAVENOUS
  Filled 2019-06-17 (×2): qty 250

## 2019-06-17 NOTE — Telephone Encounter (Signed)
Appointments scheduled letter/calendar mailed per 11/18 los

## 2019-06-17 NOTE — Progress Notes (Signed)
Dr. Marin Olp ordered US to perform therapeutic phlebotomy today for symptoms with leg pain. Will also receive IVF post. Heather Brown presents today for phlebotomy per MD orders. Phlebotomy procedure started at 1118  and ended at 1140 500 grams removed via a 20 G to RAC. Diet and nutrition offered.  Patient observed for 30 minutes after procedure without any incident. Patient tolerated procedure well.

## 2019-06-17 NOTE — Progress Notes (Signed)
Hematology and Oncology Follow Up Visit  Heather Brown [id number] 1945-09-09 73 y.o. 06/17/2019   Principle Diagnosis:   Polycythemia vera -- DNMT3A (+),  Epo = 5.7  Current Therapy:    Phlebotomy to maintain hematocrit below 45%  Aspirin 81 mg p.o. daily     Interim History:  Heather Brown is back for follow-up.  She feels that she might need a phlebotomy.  She says that when her legs start hurting, that uses a sign that her blood is too "thick."  Her hematocrit is 43.4%.  Is not that high.  However, she might need to have her blood just a little bit lower.  As such, we will go ahead and phlebotomize her.  We will then give her some IV fluid afterward.  She says this really helped the last time she was here.  With the holidays coming up, I would like to have her feel better.  Hopefully, this will help with some of the leg pain that she is having.  Otherwise, she is doing fairly well.  She is still being very cautious with the coronavirus.  She and her husband will be going down to [place] to be with her daughter for Thanksgiving.  She has had no fever.  She has had no cough.  She has had no rashes.  She has had no headache.   Overall, her performance status is ECOG 1.      Medications:  Current Outpatient Medications:  .  aspirin 81 MG tablet, Take 81 mg by mouth daily., Disp: , Rfl:  .  Biotin 2500 MCG CAPS, Take by mouth. Three times weekly, Disp: , Rfl:  .  CALCIUM CARBONATE ANTACID PO, Take 1,000 mg by mouth daily as needed for indigestion or heartburn. , Disp: , Rfl:  .  Cholecalciferol (VITAMIN D3) 1000 UNITS CAPS, Take 1,000 Units by mouth daily., Disp: , Rfl:  .  fish oil-omega-3 fatty acids 1000 MG capsule, Take 1 g by mouth daily. With 360 mg omega-3, Disp: , Rfl:  .  glucosamine-chondroitin 500-400 MG tablet, Take 1 tablet by mouth daily., Disp: , Rfl:  .  levothyroxine (SYNTHROID) 75 MCG tablet, 75 mcg daily., Disp: , Rfl:  .  loratadine (CLARITIN) 10 MG  tablet, Take 10 mg by mouth daily as needed for allergies. , Disp: , Rfl:  .  Multiple Vitamin (MULTIVITAMIN) capsule, Take 1 capsule by mouth daily., Disp: , Rfl:  .  tretinoin (RETIN-A) 0.05 % cream, APPLY PEA SIZED AMOUNT TO AFFECTED AREA OF FACE NIGHTLY, Disp: , Rfl: 5 .  triamcinolone ointment (KENALOG) 0.5 %, Apply 1 application topically 2 (two) times daily. (Patient not taking: Reported on 09/16/2018), Disp: 30 g, Rfl: 2  Allergies:  Allergies  Allergen Reactions  . Sulfa Antibiotics Hives    Past Medical History, Surgical history, Social history, and Family History were reviewed and updated.  Review of Systems: Review of Systems  HENT:  Negative.   Eyes: Negative.   Respiratory: Positive for shortness of breath.   Cardiovascular: Negative.   Gastrointestinal: Positive for abdominal distention.  Endocrine: Negative.   Musculoskeletal: Positive for arthralgias, flank pain, myalgias and neck stiffness.  Skin: Negative.   Neurological: Negative.   Hematological: Negative.   Psychiatric/Behavioral: Negative.     Physical Exam:  weight is 173 lb 12.8 oz (78.8 kg). Her oral temperature is 97.6 F (36.4 C). Her blood pressure is 112/84 and her pulse is 75. Her respiration is 20 and oxygen saturation is  98%.   Wt Readings from Last 3 Encounters:  06/17/19 173 lb 12.8 oz (78.8 kg)  04/22/19 173 lb 12.8 oz (78.8 kg)  01/15/19 173 lb (78.5 kg)    Physical Exam Vitals signs reviewed.  HENT:     Head: Normocephalic and atraumatic.  Eyes:     Pupils: Pupils are equal, round, and reactive to light.  Neck:     Musculoskeletal: Normal range of motion.  Cardiovascular:     Rate and Rhythm: Normal rate and regular rhythm.     Heart sounds: Normal heart sounds.  Pulmonary:     Effort: Pulmonary effort is normal.     Breath sounds: Normal breath sounds.  Abdominal:     General: Bowel sounds are normal.     Palpations: Abdomen is soft.  Musculoskeletal: Normal range of motion.         General: No tenderness or deformity.     Comments: Her big toe on her right foot is a little swollen.  There is some erythema associated with this.  She does have decent range of motion.  There is no tenderness to palpation over the area of erythema.  Lymphadenopathy:     Cervical: No cervical adenopathy.  Skin:    General: Skin is warm and dry.     Findings: No erythema or rash.  Neurological:     Mental Status: She is alert and oriented to person, place, and time.  Psychiatric:        Behavior: Behavior normal.        Thought Content: Thought content normal.        Judgment: Judgment normal.      Lab Results  Component Value Date   WBC 4.0 06/17/2019   HGB 13.5 06/17/2019   HCT 43.4 06/17/2019   MCV 81.6 06/17/2019   PLT 281 06/17/2019     Chemistry      Component Value Date/Time   NA 141 06/17/2019 0933   NA 140 08/19/2017 1517   K 4.2 06/17/2019 0933   CL 108 06/17/2019 0933   CO2 27 06/17/2019 0933   BUN 20 06/17/2019 0933   BUN 15 08/19/2017 1517   CREATININE 0.74 06/17/2019 0933   CREATININE 0.79 03/10/2014 0836      Component Value Date/Time   CALCIUM 10.3 06/17/2019 0933   ALKPHOS 108 06/17/2019 0933   AST 28 06/17/2019 0933   ALT 35 06/17/2019 0933   BILITOT 0.8 06/17/2019 0933       Impression and Plan: Heather Brown is a 73 year old white female.  She has polycythemia vera.  This is actually a variant of polycythemia.  Again we will go ahead and phlebotomize her today.  We might have to consider lowering the hematocrit criteria for her.  We might need to keep her hematocrit below 42%.  I would like to see her back in about 4 weeks.  I want to make sure that everything is doing better for her.  If things are better, then we will phlebotomize her if her hematocrit gets above 42%.    [name], MD 11/18/202011:11 AM

## 2019-06-22 ENCOUNTER — Telehealth: Payer: Self-pay | Admitting: *Deleted

## 2019-06-22 NOTE — Telephone Encounter (Signed)
Call received from patient to get her blood type from type and screen done on 06/17/19.  Patient notified that her blood type is AB positive.  Pt appreciative of information and has no further questions or concerns at this time.

## 2019-07-08 DIAGNOSIS — E039 Hypothyroidism, unspecified: Secondary | ICD-10-CM | POA: Diagnosis not present

## 2019-07-20 ENCOUNTER — Ambulatory Visit: Payer: Medicare Other

## 2019-07-20 ENCOUNTER — Ambulatory Visit: Payer: Medicare Other | Admitting: Hematology & Oncology

## 2019-07-20 ENCOUNTER — Other Ambulatory Visit: Payer: Medicare Other

## 2019-08-24 ENCOUNTER — Inpatient Hospital Stay: Payer: Medicare Other | Attending: Hematology & Oncology

## 2019-08-24 ENCOUNTER — Other Ambulatory Visit: Payer: Self-pay

## 2019-08-24 ENCOUNTER — Telehealth: Payer: Self-pay | Admitting: Hematology & Oncology

## 2019-08-24 ENCOUNTER — Encounter: Payer: Self-pay | Admitting: Hematology & Oncology

## 2019-08-24 ENCOUNTER — Inpatient Hospital Stay (HOSPITAL_BASED_OUTPATIENT_CLINIC_OR_DEPARTMENT_OTHER): Payer: Medicare Other | Admitting: Hematology & Oncology

## 2019-08-24 VITALS — BP 134/78 | HR 85 | Temp 97.1°F | Resp 18 | Wt 171.2 lb

## 2019-08-24 DIAGNOSIS — Z79899 Other long term (current) drug therapy: Secondary | ICD-10-CM | POA: Diagnosis not present

## 2019-08-24 DIAGNOSIS — Z7982 Long term (current) use of aspirin: Secondary | ICD-10-CM | POA: Insufficient documentation

## 2019-08-24 DIAGNOSIS — D45 Polycythemia vera: Secondary | ICD-10-CM

## 2019-08-24 DIAGNOSIS — M79606 Pain in leg, unspecified: Secondary | ICD-10-CM | POA: Insufficient documentation

## 2019-08-24 LAB — CMP (CANCER CENTER ONLY)
ALT: 35 U/L (ref 0–44)
AST: 26 U/L (ref 15–41)
Albumin: 4.2 g/dL (ref 3.5–5.0)
Alkaline Phosphatase: 125 U/L (ref 38–126)
Anion gap: 6 (ref 5–15)
BUN: 20 mg/dL (ref 8–23)
CO2: 27 mmol/L (ref 22–32)
Calcium: 10.7 mg/dL — ABNORMAL HIGH (ref 8.9–10.3)
Chloride: 109 mmol/L (ref 98–111)
Creatinine: 0.75 mg/dL (ref 0.44–1.00)
GFR, Est AFR Am: >60 mL/min (ref >60)
GFR, Estimated: >60 mL/min (ref >60)
Glucose, Bld: 103 mg/dL — ABNORMAL HIGH (ref 70–99)
Potassium: 4.3 mmol/L (ref 3.5–5.1)
Sodium: 142 mmol/L (ref 135–145)
Total Bilirubin: 0.6 mg/dL (ref 0.3–1.2)
Total Protein: 7 g/dL (ref 6.5–8.1)

## 2019-08-24 LAB — CBC WITH DIFFERENTIAL (CANCER CENTER ONLY)
Abs Immature Granulocytes: 0.01 10*3/uL (ref 0.00–0.07)
Basophils Absolute: 0 10*3/uL (ref 0.0–0.1)
Basophils Relative: 1 %
Eosinophils Absolute: 0.1 10*3/uL (ref 0.0–0.5)
Eosinophils Relative: 3 %
HCT: 42.9 % (ref 36.0–46.0)
Hemoglobin: 12.8 g/dL (ref 12.0–15.0)
Immature Granulocytes: 0 %
Lymphocytes Relative: 35 %
Lymphs Abs: 1.7 10*3/uL (ref 0.7–4.0)
MCH: 23.4 pg — ABNORMAL LOW (ref 26.0–34.0)
MCHC: 29.8 g/dL — ABNORMAL LOW (ref 30.0–36.0)
MCV: 78.4 fL — ABNORMAL LOW (ref 80.0–100.0)
Monocytes Absolute: 0.5 10*3/uL (ref 0.1–1.0)
Monocytes Relative: 10 %
Neutro Abs: 2.5 10*3/uL (ref 1.7–7.7)
Neutrophils Relative %: 51 %
Platelet Count: 360 10*3/uL (ref 150–400)
RBC: 5.47 MIL/uL — ABNORMAL HIGH (ref 3.87–5.11)
RDW: 14.9 % (ref 11.5–15.5)
WBC Count: 4.8 10*3/uL (ref 4.0–10.5)
nRBC: 0 % (ref 0.0–0.2)

## 2019-08-24 LAB — IRON AND TIBC
Iron: 29 ug/dL — ABNORMAL LOW (ref 41–142)
Saturation Ratios: 7 % — ABNORMAL LOW (ref 21–57)
TIBC: 448 ug/dL — ABNORMAL HIGH (ref 236–444)
UIBC: 418 ug/dL — ABNORMAL HIGH (ref 120–384)

## 2019-08-24 LAB — FERRITIN: Ferritin: <4 ng/mL — ABNORMAL LOW (ref 11–307)

## 2019-08-24 NOTE — Progress Notes (Signed)
Hematology and Oncology Follow Up Visit  Heather Brown [id number] [birth year] 74 y.o. 08/24/2019   Principle Diagnosis:   Polycythemia vera -- DNMT3A (+),  Epo = 5.7  Current Therapy:    Phlebotomy to maintain hematocrit below 45%  Aspirin 81 mg p.o. daily     Interim History:  Heather Brown is back for follow-up.  She is doing okay.  She had a nice Thanksgiving and Christmas holiday.  Her husband has had prostate surgery.  She is trying to help him out.  She says that she woke up I think over the weekend with some pain in the left upper quadrant of her abdomen.  There is no obvious splenomegaly by the ultrasound that she had a couple years ago.  I think this may have been a little bit of gas.  She has had no problems with bowels or bladder.  She still has some pain in the legs.  Not sure exactly what this might be.  I do not think she needs to be phlebotomized today.  She has had no fever.  She has been very cautious with the coronavirus.  Overall, her performance status is ECOG 1.      Medications:  Current Outpatient Medications:  .  aspirin 81 MG tablet, Take 81 mg by mouth daily., Disp: , Rfl:  .  Biotin 2500 MCG CAPS, Take by mouth. Three times weekly, Disp: , Rfl:  .  CALCIUM CARBONATE ANTACID PO, Take 1,000 mg by mouth daily as needed for indigestion or heartburn. , Disp: , Rfl:  .  Cholecalciferol (VITAMIN D3) 1000 UNITS CAPS, Take 1,000 Units by mouth daily., Disp: , Rfl:  .  fish oil-omega-3 fatty acids 1000 MG capsule, Take 1 g by mouth daily. With 360 mg omega-3, Disp: , Rfl:  .  glucosamine-chondroitin 500-400 MG tablet, Take 1 tablet by mouth daily., Disp: , Rfl:  .  levothyroxine (SYNTHROID) 75 MCG tablet, 75 mcg daily., Disp: , Rfl:  .  loratadine (CLARITIN) 10 MG tablet, Take 10 mg by mouth daily as needed for allergies. , Disp: , Rfl:  .  Multiple Vitamin (MULTIVITAMIN) capsule, Take 1 capsule by mouth daily., Disp: , Rfl:  .  tretinoin (RETIN-A) 0.05 %  cream, APPLY PEA SIZED AMOUNT TO AFFECTED AREA OF FACE NIGHTLY, Disp: , Rfl: 5 .  triamcinolone ointment (KENALOG) 0.5 %, Apply 1 application topically 2 (two) times daily. (Patient not taking: Reported on 09/16/2018), Disp: 30 g, Rfl: 2  Allergies:  Allergies  Allergen Reactions  . Sulfa Antibiotics Hives    Past Medical History, Surgical history, Social history, and Family History were reviewed and updated.  Review of Systems: Review of Systems  HENT:  Negative.   Eyes: Negative.   Respiratory: Positive for shortness of breath.   Cardiovascular: Negative.   Gastrointestinal: Positive for abdominal distention.  Endocrine: Negative.   Musculoskeletal: Positive for arthralgias, flank pain, myalgias and neck stiffness.  Skin: Negative.   Neurological: Negative.   Hematological: Negative.   Psychiatric/Behavioral: Negative.     Physical Exam:  weight is 171 lb 4 oz (77.7 kg). Her temporal temperature is 97.1 F (36.2 C) (abnormal). Her blood pressure is 134/78 and her pulse is 85. Her respiration is 18 and oxygen saturation is 100%.   Wt Readings from Last 3 Encounters:  08/24/19 171 lb 4 oz (77.7 kg)  06/17/19 173 lb 12.8 oz (78.8 kg)  04/22/19 173 lb 12.8 oz (78.8 kg)    Physical Exam Vitals reviewed.  HENT:     Head: Normocephalic and atraumatic.  Eyes:     Pupils: Pupils are equal, round, and reactive to light.  Cardiovascular:     Rate and Rhythm: Normal rate and regular rhythm.     Heart sounds: Normal heart sounds.  Pulmonary:     Effort: Pulmonary effort is normal.     Breath sounds: Normal breath sounds.  Abdominal:     General: Bowel sounds are normal.     Palpations: Abdomen is soft.  Musculoskeletal:        General: No tenderness or deformity. Normal range of motion.     Cervical back: Normal range of motion.     Comments: Her big toe on her right foot is a little swollen.  There is some erythema associated with this.  She does have decent range of  motion.  There is no tenderness to palpation over the area of erythema.  Lymphadenopathy:     Cervical: No cervical adenopathy.  Skin:    General: Skin is warm and dry.     Findings: No erythema or rash.  Neurological:     Mental Status: She is alert and oriented to person, place, and time.  Psychiatric:        Behavior: Behavior normal.        Thought Content: Thought content normal.        Judgment: Judgment normal.      Lab Results  Component Value Date   WBC 4.8 08/24/2019   HGB 12.8 08/24/2019   HCT 42.9 08/24/2019   MCV 78.4 (L) 08/24/2019   PLT 360 08/24/2019     Chemistry      Component Value Date/Time   NA 142 08/24/2019 1035   NA 140 08/19/2017 1517   K 4.3 08/24/2019 1035   CL 109 08/24/2019 1035   CO2 27 08/24/2019 1035   BUN 20 08/24/2019 1035   BUN 15 08/19/2017 1517   CREATININE 0.75 08/24/2019 1035   CREATININE 0.79 03/10/2014 0836      Component Value Date/Time   CALCIUM 10.7 (H) 08/24/2019 1035   ALKPHOS 125 08/24/2019 1035   AST 26 08/24/2019 1035   ALT 35 08/24/2019 1035   BILITOT 0.6 08/24/2019 1035       Impression and Plan: Heather Brown is a 74 year old white female.  She has polycythemia vera.  This is actually a variant of polycythemia.  We will not phlebotomize her today.  I do think that her blood is not high enough.  We will plan to get her back in another 3 months.  She will see Dr. Sabra Heck, who really is her primary doctor, and she will take blood work with her.[name], MD 1/25/202112:42 PM

## 2019-08-24 NOTE — Telephone Encounter (Signed)
Appointments scheduled calendar printed per 1/25 los 

## 2019-09-06 ENCOUNTER — Ambulatory Visit: Payer: Medicare Other | Attending: Internal Medicine

## 2019-09-06 DIAGNOSIS — Z23 Encounter for immunization: Secondary | ICD-10-CM | POA: Insufficient documentation

## 2019-09-06 NOTE — Progress Notes (Signed)
   Covid-19 Vaccination Clinic  Name:  Heather Brown    MRN: [id number] DOB: 1945/08/16  09/06/2019  [name] was observed post Covid-19 immunization for 15 minutes without incidence. She was provided with Vaccine Information Sheet and instruction to access the V-Safe system.   [name] was instructed to call 911 with any severe reactions post vaccine: Marland Kitchen Difficulty breathing  . Swelling of your face and throat  . A fast heartbeat  . A bad rash all over your body  . Dizziness and weakness    Immunizations Administered    Name Date Dose VIS Date Route   Pfizer COVID-19 Vaccine 09/06/2019  6:10 PM 0.3 mL 07/10/2019 Intramuscular   Manufacturer: [place]   Lot: [id number]   [place]: [id number]

## 2019-09-09 ENCOUNTER — Other Ambulatory Visit: Payer: Self-pay

## 2019-09-11 ENCOUNTER — Ambulatory Visit (INDEPENDENT_AMBULATORY_CARE_PROVIDER_SITE_OTHER): Payer: Medicare Other | Admitting: Obstetrics & Gynecology

## 2019-09-11 ENCOUNTER — Encounter: Payer: Self-pay | Admitting: Obstetrics & Gynecology

## 2019-09-11 ENCOUNTER — Other Ambulatory Visit: Payer: Self-pay

## 2019-09-11 VITALS — BP 125/70 | HR 78 | Temp 97.7°F | Ht 61.5 in | Wt 165.0 lb

## 2019-09-11 DIAGNOSIS — Z01419 Encounter for gynecological examination (general) (routine) without abnormal findings: Secondary | ICD-10-CM | POA: Diagnosis not present

## 2019-09-11 DIAGNOSIS — M810 Age-related osteoporosis without current pathological fracture: Secondary | ICD-10-CM | POA: Diagnosis not present

## 2019-09-11 NOTE — Progress Notes (Signed)
74 y.o. G89P2002 Married White or Caucasian female here for annual exam.  Doing well.  Wearing a pad from time to time.  Does occasionally have a little leakage.  Daughter passed two years ago.  Older daughter adopted next to the youngest grandchild that stayed with pt for many months.    Did get her first Covid vaccination.    Has done thyroid levels done with Dr. Chalmers Cater.    Patient's last menstrual period was 07/30/1980.          Sexually active: Yes.    The current method of family planning is post menopausal status.    Exercising: Yes.    walking Smoker:  no  Health Maintenance: Pap:  04/07/15 Neg              03/11/13 Neg. HR HPV:neg  History of abnormal Pap:  yes MMG:  08/08/17 BIRADS2:Benign.  Discussed with pt.  She is aware this is due.   Colonoscopy:  12/22/14  F/u 2-3 years.  Pt thinks she's had a follow-up at his office.   BMD:   08/08/17 Osteoporosis.  Declined treatment thus far. TDaP:  2014 Pneumonia vaccine(s):  Done Shingrix:  Zostavax a few years ago. Hep C testing: No Screening Labs: Up to date.       reports that she has never smoked. She has never used smokeless tobacco. She reports current alcohol use. She reports that she does not use drugs.  Past Medical History:  Diagnosis Date  . Dyspareunia    dryness with intercourse  . Endometriosis 1982   found at [facility]  . GERD (gastroesophageal reflux disease)   . Heart murmur   . History of left shoulder fracture 2017  . Hypothyroidism   . Left ACL tear   . Osteopenia 2009  . Pneumonia   . Polycythemia vera ([place]) 12/02/2017  . PONV (postoperative nausea and vomiting)   . Thyroid nodule    followed by Dr Chalmers Cater  . Urinary incontinence    with coughing/sneezing    Past Surgical History:  Procedure Laterality Date  . ABDOMINAL HYSTERECTOMY  1982   TAH--for abnormal paps/endometriosis  . AUGMENTATION MAMMAPLASTY  1977  . COLONOSCOPY WITH PROPOFOL N/A 10/29/2013   Procedure: COLONOSCOPY WITH PROPOFOL;  Surgeon: Jeryl Columbia, MD;  Location: [facility] ENDOSCOPY;  Service: Endoscopy;  Laterality: N/A;  . COLONOSCOPY WITH PROPOFOL N/A 12/22/2014   Procedure: COLONOSCOPY WITH PROPOFOL;  Surgeon: Clarene Essex, MD;  Location: [facility] ENDOSCOPY;  Service: Endoscopy;  Laterality: N/A;  ultra slim scope  . COSMETIC SURGERY  11/2011   face lift,neck lift, eye lift--Dr. Stephanie Coup  . dilatation and curettage  1982  . HOT HEMOSTASIS N/A 12/22/2014   Procedure: HOT HEMOSTASIS (ARGON PLASMA COAGULATION/BICAP);  Surgeon: Clarene Essex, MD;  Location: [facility] ENDOSCOPY;  Service: Endoscopy;  Laterality: N/A;  . TONSILLECTOMY      Current Outpatient Medications  Medication Sig Dispense Refill  . aspirin 81 MG tablet Take 81 mg by mouth daily.    . Biotin 2500 MCG CAPS Take by mouth. Three times weekly    . CALCIUM CARBONATE ANTACID PO Take 1,000 mg by mouth daily as needed for indigestion or heartburn.     . Cholecalciferol (VITAMIN D3) 1000 UNITS CAPS Take 1,000 Units by mouth daily.    . fish oil-omega-3 fatty acids 1000 MG capsule Take 1 g by mouth daily. With 360 mg omega-3    . glucosamine-chondroitin 500-400 MG tablet Take 1 tablet by mouth daily.    Marland Kitchen  levothyroxine (SYNTHROID) 75 MCG tablet 75 mcg daily.    Marland Kitchen loratadine (CLARITIN) 10 MG tablet Take 10 mg by mouth daily as needed for allergies.     . Multiple Vitamin (MULTIVITAMIN) capsule Take 1 capsule by mouth daily.    Marland Kitchen tretinoin (RETIN-A) 0.05 % cream APPLY PEA SIZED AMOUNT TO AFFECTED AREA OF FACE NIGHTLY  5  . triamcinolone ointment (KENALOG) 0.5 % Apply 1 application topically 2 (two) times daily. (Patient not taking: Reported on 09/16/2018) 30 g 2   No current facility-administered medications for this visit.    Family History  Problem Relation Age of Onset  . Cancer Mother   . Cervical cancer Mother   . Diabetes Father        adult onset  . Stroke Father   . Thyroid disease Sister        hyperthyroid  . Asthma Sister   . Thyroid disease Sister        hypothyroid  .  Cancer Maternal Grandmother   . Stomach cancer Maternal Grandmother     Review of Systems  All other systems reviewed and are negative.   Exam:   Vitals:   09/11/19 0948  BP: 125/70  Pulse: 78  Temp: 97.7 F (36.5 C)    General appearance: alert, cooperative and appears stated age Head: Normocephalic, without obvious abnormality, atraumatic Neck: no adenopathy, supple, symmetrical, trachea midline and thyroid normal to inspection and palpation Lungs: clear to auscultation bilaterally Breasts: normal appearance, no masses or tenderness Heart: regular rate and rhythm Abdomen: soft, non-tender; bowel sounds normal; no masses,  no organomegaly Extremities: extremities normal, atraumatic, no cyanosis or edema Skin: Skin color, texture, turgor normal. No rashes or lesions Lymph nodes: Cervical, supraclavicular, and axillary nodes normal. No abnormal inguinal nodes palpated Neurologic: Grossly normal   Pelvic: External genitalia:  no lesions              Urethra:  normal appearing urethra with no masses, tenderness or lesions              Bartholins and Skenes: normal                 Vagina: normal appearing vagina with normal color and discharge, no lesions              Cervix: absent              Pap taken: No. Bimanual Exam:  Uterus:  uterus absent              Adnexa: no mass, fullness, tenderness               Rectovaginal: Confirms               Anus:  normal sphincter tone, no lesions  Chaperone, Terence Lux, CMA, was present for exam.  A:  Well Woman with normal exam PMP, no HRT Vaginal atrophic H/o TAH 1982 Hypothyroidism Early osteoporosis.  Has declined treatment. Bilateral silicone rupture noted on MMG in 07/2017  P:   Mammogram guidelines reviewed.  Pt is aware this is due. pap smear not indicated BMD order placed for pt to repeat this year.  She will call to schedule. Recommended to consider having treatment for ruptured breast implants this year.  Has  seen Dr. Stephanie Coup Colonoscopy records release signed today Does not need blood work Shingrix vaccination discussed.  She declines now. Return annually or prn

## 2019-09-23 ENCOUNTER — Ambulatory Visit: Payer: Medicare Other

## 2019-09-24 DIAGNOSIS — Z20828 Contact with and (suspected) exposure to other viral communicable diseases: Secondary | ICD-10-CM | POA: Diagnosis not present

## 2019-09-28 DIAGNOSIS — Z03818 Encounter for observation for suspected exposure to other biological agents ruled out: Secondary | ICD-10-CM | POA: Diagnosis not present

## 2019-10-01 ENCOUNTER — Ambulatory Visit: Payer: Medicare Other | Attending: Internal Medicine

## 2019-10-01 DIAGNOSIS — Z23 Encounter for immunization: Secondary | ICD-10-CM | POA: Insufficient documentation

## 2019-10-01 NOTE — Progress Notes (Signed)
   Covid-19 Vaccination Clinic  Name:  JALENA WOELK    MRN: [id number] DOB: 06/07/1946  10/01/2019  Ms. Brause was observed post Covid-19 immunization for 15 minutes without incident. She was provided with Vaccine Information Sheet and instruction to access the V-Safe system.   Ms. Hults was instructed to call 911 with any severe reactions post vaccine: Marland Kitchen Difficulty breathing  . Swelling of face and throat  . A fast heartbeat  . A bad rash all over body  . Dizziness and weakness   Immunizations Administered    Name Date Dose VIS Date Route   Pfizer COVID-19 Vaccine 10/01/2019  4:15 PM 0.3 mL 07/10/2019 Intramuscular   Manufacturer: [place]   Lot: [id number]   [place]: [id number]

## 2019-10-21 DIAGNOSIS — Z0120 Encounter for dental examination and cleaning without abnormal findings: Secondary | ICD-10-CM | POA: Diagnosis not present

## 2019-11-24 ENCOUNTER — Inpatient Hospital Stay: Payer: Medicare Other | Attending: Hematology & Oncology

## 2019-11-24 ENCOUNTER — Inpatient Hospital Stay: Payer: Medicare Other | Admitting: Hematology & Oncology

## 2019-11-24 ENCOUNTER — Encounter: Payer: Self-pay | Admitting: Hematology & Oncology

## 2019-11-24 ENCOUNTER — Telehealth: Payer: Self-pay | Admitting: Hematology & Oncology

## 2019-11-24 ENCOUNTER — Other Ambulatory Visit: Payer: Self-pay

## 2019-11-24 ENCOUNTER — Other Ambulatory Visit: Payer: Self-pay | Admitting: *Deleted

## 2019-11-24 VITALS — BP 128/88 | HR 83 | Temp 97.8°F | Resp 18 | Wt 165.0 lb

## 2019-11-24 DIAGNOSIS — H40013 Open angle with borderline findings, low risk, bilateral: Secondary | ICD-10-CM | POA: Diagnosis not present

## 2019-11-24 DIAGNOSIS — D45 Polycythemia vera: Secondary | ICD-10-CM | POA: Diagnosis not present

## 2019-11-24 DIAGNOSIS — E038 Other specified hypothyroidism: Secondary | ICD-10-CM | POA: Diagnosis not present

## 2019-11-24 DIAGNOSIS — H35363 Drusen (degenerative) of macula, bilateral: Secondary | ICD-10-CM | POA: Diagnosis not present

## 2019-11-24 DIAGNOSIS — Z79899 Other long term (current) drug therapy: Secondary | ICD-10-CM | POA: Diagnosis not present

## 2019-11-24 DIAGNOSIS — H2513 Age-related nuclear cataract, bilateral: Secondary | ICD-10-CM | POA: Diagnosis not present

## 2019-11-24 DIAGNOSIS — H04123 Dry eye syndrome of bilateral lacrimal glands: Secondary | ICD-10-CM | POA: Diagnosis not present

## 2019-11-24 DIAGNOSIS — E039 Hypothyroidism, unspecified: Secondary | ICD-10-CM | POA: Diagnosis not present

## 2019-11-24 DIAGNOSIS — Z7982 Long term (current) use of aspirin: Secondary | ICD-10-CM | POA: Diagnosis not present

## 2019-11-24 DIAGNOSIS — Z5181 Encounter for therapeutic drug level monitoring: Secondary | ICD-10-CM | POA: Insufficient documentation

## 2019-11-24 LAB — CMP (CANCER CENTER ONLY)
ALT: 40 U/L (ref 0–44)
AST: 31 U/L (ref 15–41)
Albumin: 4.2 g/dL (ref 3.5–5.0)
Alkaline Phosphatase: 122 U/L (ref 38–126)
Anion gap: 6 (ref 5–15)
BUN: 18 mg/dL (ref 8–23)
CO2: 27 mmol/L (ref 22–32)
Calcium: 10.9 mg/dL — ABNORMAL HIGH (ref 8.9–10.3)
Chloride: 109 mmol/L (ref 98–111)
Creatinine: 0.73 mg/dL (ref 0.44–1.00)
GFR, Est AFR Am: >60 mL/min (ref >60)
GFR, Estimated: >60 mL/min (ref >60)
Glucose, Bld: 109 mg/dL — ABNORMAL HIGH (ref 70–99)
Potassium: 4.3 mmol/L (ref 3.5–5.1)
Sodium: 142 mmol/L (ref 135–145)
Total Bilirubin: 1 mg/dL (ref 0.3–1.2)
Total Protein: 7 g/dL (ref 6.5–8.1)

## 2019-11-24 LAB — CBC WITH DIFFERENTIAL (CANCER CENTER ONLY)
Abs Immature Granulocytes: 0.01 10*3/uL (ref 0.00–0.07)
Basophils Absolute: 0.1 10*3/uL (ref 0.0–0.1)
Basophils Relative: 1 %
Eosinophils Absolute: 0.1 10*3/uL (ref 0.0–0.5)
Eosinophils Relative: 3 %
HCT: 42.4 % (ref 36.0–46.0)
Hemoglobin: 12.9 g/dL (ref 12.0–15.0)
Immature Granulocytes: 0 %
Lymphocytes Relative: 35 %
Lymphs Abs: 1.4 10*3/uL (ref 0.7–4.0)
MCH: 23.5 pg — ABNORMAL LOW (ref 26.0–34.0)
MCHC: 30.4 g/dL (ref 30.0–36.0)
MCV: 77.1 fL — ABNORMAL LOW (ref 80.0–100.0)
Monocytes Absolute: 0.5 10*3/uL (ref 0.1–1.0)
Monocytes Relative: 13 %
Neutro Abs: 1.8 10*3/uL (ref 1.7–7.7)
Neutrophils Relative %: 48 %
Platelet Count: 294 10*3/uL (ref 150–400)
RBC: 5.5 MIL/uL — ABNORMAL HIGH (ref 3.87–5.11)
RDW: 18.5 % — ABNORMAL HIGH (ref 11.5–15.5)
WBC Count: 3.9 10*3/uL — ABNORMAL LOW (ref 4.0–10.5)
nRBC: 0 % (ref 0.0–0.2)

## 2019-11-24 LAB — FERRITIN: Ferritin: 6 ng/mL — ABNORMAL LOW (ref 11–307)

## 2019-11-24 LAB — IRON AND TIBC
Iron: 60 ug/dL (ref 41–142)
Saturation Ratios: 13 % — ABNORMAL LOW (ref 21–57)
TIBC: 454 ug/dL — ABNORMAL HIGH (ref 236–444)
UIBC: 394 ug/dL — ABNORMAL HIGH (ref 120–384)

## 2019-11-24 LAB — TSH: TSH: 1.102 u[IU]/mL (ref 0.308–3.960)

## 2019-11-24 NOTE — Progress Notes (Signed)
Hematology and Oncology Follow Up Visit  Heather Brown [id number] [birth year] 74 y.o. 11/24/2019   Principle Diagnosis:   Polycythemia vera -- DNMT3A (+),  Epo = 5.7  Current Therapy:    Phlebotomy to maintain hematocrit below 45%  Aspirin 81 mg p.o. daily     Interim History:  Heather Brown is back for follow-up.  She looks great.  Her problem right now that she hurt her left side.  She bent over yesterday.  I suspect she probably pulled an intercostal muscle over on the left side.  I told her to try an over-the-counter pain patch to for the discomfort.  She is really into her blood type.  She has a blood type AB+.  She is reading a book about blood type and what to do and eat.  She is trying to follow this.  I told her that her liver is fine.  She can have wine daily if she wants.  She also can have spinach.  She likes to make a vitamin protein smoothie in the morning.  She has not been phlebotomized for quite a while which is wonderful.  Her hematocrit has been holding around 42-43.  She has had no problems with fever.  She has had no cough or shortness of breath.  She has had a coronavirus vaccines.  There is been no problems with bowels or bladder.  She has had no swelling in the legs.  Overall, her performance status is ECOG 1.       Medications:  Current Outpatient Medications:  .  aspirin 81 MG tablet, Take 81 mg by mouth daily., Disp: , Rfl:  .  Biotin 2500 MCG CAPS, Take by mouth. Three times weekly, Disp: , Rfl:  .  CALCIUM CARBONATE ANTACID PO, Take 1,000 mg by mouth daily as needed for indigestion or heartburn. , Disp: , Rfl:  .  Cholecalciferol (VITAMIN D3) 1000 UNITS CAPS, Take 1,000 Units by mouth daily., Disp: , Rfl:  .  fish oil-omega-3 fatty acids 1000 MG capsule, Take 1 g by mouth daily. With 360 mg omega-3, Disp: , Rfl:  .  glucosamine-chondroitin 500-400 MG tablet, Take 1 tablet by mouth daily., Disp: , Rfl:  .  levothyroxine (SYNTHROID) 75 MCG tablet, 75  mcg daily., Disp: , Rfl:  .  loratadine (CLARITIN) 10 MG tablet, Take 10 mg by mouth daily as needed for allergies. , Disp: , Rfl:  .  Multiple Vitamin (MULTIVITAMIN) capsule, Take 1 capsule by mouth daily., Disp: , Rfl:  .  tretinoin (RETIN-A) 0.05 % cream, APPLY PEA SIZED AMOUNT TO AFFECTED AREA OF FACE NIGHTLY, Disp: , Rfl: 5 .  triamcinolone ointment (KENALOG) 0.5 %, Apply 1 application topically 2 (two) times daily., Disp: 30 g, Rfl: 2  Allergies:  Allergies  Allergen Reactions  . Sulfa Antibiotics Hives    Past Medical History, Surgical history, Social history, and Family History were reviewed and updated.  Review of Systems: Review of Systems  HENT:  Negative.   Eyes: Negative.   Respiratory: Positive for shortness of breath.   Cardiovascular: Negative.   Gastrointestinal: Positive for abdominal distention.  Endocrine: Negative.   Musculoskeletal: Positive for arthralgias, flank pain, myalgias and neck stiffness.  Skin: Negative.   Neurological: Negative.   Hematological: Negative.   Psychiatric/Behavioral: Negative.     Physical Exam:  vitals were not taken for this visit.   Wt Readings from Last 3 Encounters:  09/11/19 165 lb (74.8 kg)  08/24/19 171 lb 4 oz (  77.7 kg)  06/17/19 173 lb 12.8 oz (78.8 kg)    Physical Exam Vitals reviewed.  HENT:     Head: Normocephalic and atraumatic.  Eyes:     Pupils: Pupils are equal, round, and reactive to light.  Cardiovascular:     Rate and Rhythm: Normal rate and regular rhythm.     Heart sounds: Normal heart sounds.  Pulmonary:     Effort: Pulmonary effort is normal.     Breath sounds: Normal breath sounds.  Abdominal:     General: Bowel sounds are normal.     Palpations: Abdomen is soft.  Musculoskeletal:        General: No tenderness or deformity. Normal range of motion.     Cervical back: Normal range of motion.     Comments: Her big toe on her right foot is a little swollen.  There is some erythema associated  with this.  She does have decent range of motion.  There is no tenderness to palpation over the area of erythema.  Lymphadenopathy:     Cervical: No cervical adenopathy.  Skin:    General: Skin is warm and dry.     Findings: No erythema or rash.  Neurological:     Mental Status: She is alert and oriented to person, place, and time.  Psychiatric:        Behavior: Behavior normal.        Thought Content: Thought content normal.        Judgment: Judgment normal.      Lab Results  Component Value Date   WBC 3.9 (L) 11/24/2019   HGB 12.9 11/24/2019   HCT 42.4 11/24/2019   MCV 77.1 (L) 11/24/2019   PLT 294 11/24/2019     Chemistry      Component Value Date/Time   NA 142 08/24/2019 1035   NA 140 08/19/2017 1517   K 4.3 08/24/2019 1035   CL 109 08/24/2019 1035   CO2 27 08/24/2019 1035   BUN 20 08/24/2019 1035   BUN 15 08/19/2017 1517   CREATININE 0.75 08/24/2019 1035   CREATININE 0.79 03/10/2014 0836      Component Value Date/Time   CALCIUM 10.7 (H) 08/24/2019 1035   ALKPHOS 125 08/24/2019 1035   AST 26 08/24/2019 1035   ALT 35 08/24/2019 1035   BILITOT 0.6 08/24/2019 1035       Impression and Plan: Heather Brown is a 74 year old white female.  She has polycythemia vera.  This is actually a variant of polycythemia.  We will not phlebotomize her today.  I do think that her blood is not high enough.  We will plan to get her back in another 4 months now.  I believe that her iron studies should be okay.  We are checking her thyroid today.  This way she 1 had had another blood draw by her family doctor.  She is very grateful that we can do this for her.  She will see Heather Brown, who really is her primary doctor, and she will take blood work with her.Heather Napoleon, MD 4/27/202110:45 AM

## 2019-11-24 NOTE — Telephone Encounter (Signed)
Appointments scheduled. Patient declined calendar she has My Chart Access. Per 4/27 los

## 2019-11-25 ENCOUNTER — Encounter: Payer: Self-pay | Admitting: *Deleted

## 2019-12-11 DIAGNOSIS — H524 Presbyopia: Secondary | ICD-10-CM | POA: Diagnosis not present

## 2019-12-31 DIAGNOSIS — D2262 Melanocytic nevi of left upper limb, including shoulder: Secondary | ICD-10-CM | POA: Diagnosis not present

## 2019-12-31 DIAGNOSIS — D2261 Melanocytic nevi of right upper limb, including shoulder: Secondary | ICD-10-CM | POA: Diagnosis not present

## 2019-12-31 DIAGNOSIS — L821 Other seborrheic keratosis: Secondary | ICD-10-CM | POA: Diagnosis not present

## 2019-12-31 DIAGNOSIS — L820 Inflamed seborrheic keratosis: Secondary | ICD-10-CM | POA: Diagnosis not present

## 2020-01-11 ENCOUNTER — Encounter: Payer: Self-pay | Admitting: Plastic Surgery

## 2020-01-11 ENCOUNTER — Ambulatory Visit (INDEPENDENT_AMBULATORY_CARE_PROVIDER_SITE_OTHER): Payer: Medicare Other | Admitting: Plastic Surgery

## 2020-01-11 ENCOUNTER — Other Ambulatory Visit: Payer: Self-pay

## 2020-01-11 ENCOUNTER — Institutional Professional Consult (permissible substitution): Payer: Medicare Other | Admitting: Plastic Surgery

## 2020-01-11 DIAGNOSIS — T8543XA Leakage of breast prosthesis and implant, initial encounter: Secondary | ICD-10-CM | POA: Diagnosis not present

## 2020-01-11 NOTE — Progress Notes (Signed)
Patient ID: Heather Brown, female    DOB: 11-Jun-1946, 74 y.o.   MRN: [id number]   Chief Complaint  Patient presents with  . Consult  . Skin Problem    The patient is a very sweet 74 year old lady here for evaluation of her breasts.  She had silicone implants placed in [place] in the 1970s.  She states that they are silicone.  She is not sure what size they are.  She was probably around in a cup when they were put in and now is around a C cup.  She is 5 feet 2 inches tall and weighs 165 pounds.  She had a mammogram 2 years ago which was negative other than showing the ruptured implants.  She knows she will need an updated mammogram.  She is not a smoker.  She has a diagnosis of polycythemia vera and is otherwise in good health.  She has had other surgery by Dr. Stephanie Coup.   Review of Systems  Constitutional: Negative.   HENT: Negative.   Eyes: Negative.   Respiratory: Negative.  Negative for chest tightness and shortness of breath.   Cardiovascular: Negative.  Negative for leg swelling.  Gastrointestinal: Negative.  Negative for abdominal distention.  Endocrine: Negative.   Genitourinary: Negative.   Musculoskeletal: Negative.   Hematological: Negative.   Psychiatric/Behavioral: Negative.     Past Medical History:  Diagnosis Date  . Dyspareunia    dryness with intercourse  . Endometriosis 1982   found at [place]  . GERD (gastroesophageal reflux disease)   . Heart murmur   . History of left shoulder fracture 2017  . Hypothyroidism   . Left ACL tear   . Osteopenia 2009  . Pneumonia   . Polycythemia vera ([place]) 12/02/2017  . PONV (postoperative nausea and vomiting)   . Thyroid nodule    followed by Dr Chalmers Cater  . Urinary incontinence    with coughing/sneezing    Past Surgical History:  Procedure Laterality Date  . ABDOMINAL HYSTERECTOMY  1982   [facility]--for abnormal paps/endometriosis  . AUGMENTATION MAMMAPLASTY  1977  . COLONOSCOPY WITH PROPOFOL N/A 10/29/2013   Procedure:  COLONOSCOPY WITH PROPOFOL;  Surgeon: Jeryl Columbia, MD;  Location: [facility] ENDOSCOPY;  Service: Endoscopy;  Laterality: N/A;  . COLONOSCOPY WITH PROPOFOL N/A 12/22/2014   Procedure: COLONOSCOPY WITH PROPOFOL;  Surgeon: Clarene Essex, MD;  [facility];  Service: Endoscopy;  Laterality: N/A;  ultra slim scope  . COSMETIC SURGERY  11/2011   face lift,neck lift, eye lift--Dr. Stephanie Coup  . dilatation and curettage  1982  . HOT HEMOSTASIS N/A 12/22/2014   Procedure: HOT HEMOSTASIS (ARGON PLASMA COAGULATION/BICAP);  Surgeon: Clarene Essex, MD;  Location: [facility] ENDOSCOPY;  Service: Endoscopy;  Laterality: N/A;  . TONSILLECTOMY        Current Outpatient Medications:  .  aspirin 81 MG tablet, Take 81 mg by mouth daily., Disp: , Rfl:  .  Biotin 2500 MCG CAPS, Take by mouth. Three times weekly, Disp: , Rfl:  .  CALCIUM CARBONATE ANTACID PO, Take 1,000 mg by mouth daily as needed for indigestion or heartburn. , Disp: , Rfl:  .  Cholecalciferol (VITAMIN D3) 1000 UNITS CAPS, Take 1,000 Units by mouth daily., Disp: , Rfl:  .  fish oil-omega-3 fatty acids 1000 MG capsule, Take 1 g by mouth daily. With 360 mg omega-3, Disp: , Rfl:  .  glucosamine-chondroitin 500-400 MG tablet, Take 1 tablet by mouth daily., Disp: , Rfl:  .  levothyroxine (SYNTHROID)  75 MCG tablet, 75 mcg daily., Disp: , Rfl:  .  loratadine (CLARITIN) 10 MG tablet, Take 10 mg by mouth daily as needed for allergies. , Disp: , Rfl:  .  Multiple Vitamin (MULTIVITAMIN) capsule, Take 1 capsule by mouth daily., Disp: , Rfl:  .  tretinoin (RETIN-A) 0.05 % cream, APPLY PEA SIZED AMOUNT TO AFFECTED AREA OF FACE NIGHTLY, Disp: , Rfl: 5 .  triamcinolone ointment (KENALOG) 0.5 %, Apply 1 application topically 2 (two) times daily. (Patient not taking: Reported on 01/11/2020), Disp: 30 g, Rfl: 2   Objective:   Vitals:   01/11/20 1529  BP: 130/62  Pulse: 82  Temp: 98.7 F (37.1 C)  SpO2: 96%    Physical Exam Vitals and nursing note reviewed.    Constitutional:      Appearance: Normal appearance.  HENT:     Head: Normocephalic and atraumatic.  Cardiovascular:     Rate and Rhythm: Normal rate.     Pulses: Normal pulses.  Pulmonary:     Effort: Pulmonary effort is normal. No respiratory distress.  Abdominal:     General: Abdomen is flat. There is no distension.     Tenderness: There is no abdominal tenderness.  Skin:    General: Skin is warm.     Capillary Refill: Capillary refill takes less than 2 seconds.  Neurological:     General: No focal deficit present.     Mental Status: She is alert and oriented to person, place, and time.  Psychiatric:        Mood and Affect: Mood normal.        Behavior: Behavior normal.        Thought Content: Thought content normal.     Assessment & Plan:  Ruptured silicone breast implant, initial encounter  Recommend bilateral removal of ruptured implants with complete capsulectomy.  Patient is interested in a mastopexy.  She agrees not to have implants put back in due to being cautious and safe at her age of 42.  I think this is a very good decision.  She will get the mammogram and have the results sent to [place]. [place] will provide her with a quote.  Pictures were obtained of the patient and placed in the chart with the patient's or guardian's permission.   [place], DO

## 2020-01-22 ENCOUNTER — Other Ambulatory Visit: Payer: Self-pay | Admitting: Obstetrics & Gynecology

## 2020-01-22 DIAGNOSIS — Z1231 Encounter for screening mammogram for malignant neoplasm of breast: Secondary | ICD-10-CM

## 2020-02-02 ENCOUNTER — Ambulatory Visit
Admission: RE | Admit: 2020-02-02 | Discharge: 2020-02-02 | Disposition: A | Discharge status: Home / Self Care | Payer: Medicare Other | Source: Ambulatory Visit | Attending: Obstetrics & Gynecology | Admitting: Obstetrics & Gynecology

## 2020-02-02 ENCOUNTER — Other Ambulatory Visit: Payer: Self-pay

## 2020-02-02 DIAGNOSIS — Z1231 Encounter for screening mammogram for malignant neoplasm of breast: Secondary | ICD-10-CM

## 2020-02-04 ENCOUNTER — Ambulatory Visit: Payer: Medicare Other

## 2020-03-18 DIAGNOSIS — Z719 Counseling, unspecified: Secondary | ICD-10-CM

## 2020-03-23 ENCOUNTER — Inpatient Hospital Stay: Payer: Medicare Other

## 2020-03-23 ENCOUNTER — Telehealth: Payer: Self-pay | Admitting: Family

## 2020-03-23 ENCOUNTER — Inpatient Hospital Stay: Payer: Medicare Other | Admitting: Hematology & Oncology

## 2020-03-23 ENCOUNTER — Other Ambulatory Visit: Payer: Self-pay

## 2020-03-23 ENCOUNTER — Inpatient Hospital Stay (HOSPITAL_BASED_OUTPATIENT_CLINIC_OR_DEPARTMENT_OTHER): Payer: Medicare Other | Admitting: Family

## 2020-03-23 ENCOUNTER — Encounter: Payer: Self-pay | Admitting: Family

## 2020-03-23 ENCOUNTER — Inpatient Hospital Stay: Payer: Medicare Other | Attending: Hematology & Oncology

## 2020-03-23 VITALS — BP 125/63 | HR 82 | Temp 97.9°F | Resp 18 | Wt 163.0 lb

## 2020-03-23 DIAGNOSIS — D500 Iron deficiency anemia secondary to blood loss (chronic): Secondary | ICD-10-CM

## 2020-03-23 DIAGNOSIS — Z7982 Long term (current) use of aspirin: Secondary | ICD-10-CM | POA: Diagnosis not present

## 2020-03-23 DIAGNOSIS — Z79899 Other long term (current) drug therapy: Secondary | ICD-10-CM | POA: Diagnosis not present

## 2020-03-23 DIAGNOSIS — D45 Polycythemia vera: Secondary | ICD-10-CM

## 2020-03-23 DIAGNOSIS — E038 Other specified hypothyroidism: Secondary | ICD-10-CM

## 2020-03-23 LAB — CBC WITH DIFFERENTIAL (CANCER CENTER ONLY)
Abs Immature Granulocytes: 0.02 10*3/uL (ref 0.00–0.07)
Basophils Absolute: 0 10*3/uL (ref 0.0–0.1)
Basophils Relative: 1 %
Eosinophils Absolute: 0.1 10*3/uL (ref 0.0–0.5)
Eosinophils Relative: 3 %
HCT: 41.9 % (ref 36.0–46.0)
Hemoglobin: 12.9 g/dL (ref 12.0–15.0)
Immature Granulocytes: 1 %
Lymphocytes Relative: 38 %
Lymphs Abs: 1.5 10*3/uL (ref 0.7–4.0)
MCH: 23.9 pg — ABNORMAL LOW (ref 26.0–34.0)
MCHC: 30.8 g/dL (ref 30.0–36.0)
MCV: 77.7 fL — ABNORMAL LOW (ref 80.0–100.0)
Monocytes Absolute: 0.5 10*3/uL (ref 0.1–1.0)
Monocytes Relative: 14 %
Neutro Abs: 1.7 10*3/uL (ref 1.7–7.7)
Neutrophils Relative %: 43 %
Platelet Count: 313 10*3/uL (ref 150–400)
RBC: 5.39 MIL/uL — ABNORMAL HIGH (ref 3.87–5.11)
RDW: 16.7 % — ABNORMAL HIGH (ref 11.5–15.5)
WBC Count: 3.9 10*3/uL — ABNORMAL LOW (ref 4.0–10.5)
nRBC: 0 % (ref 0.0–0.2)

## 2020-03-23 LAB — CMP (CANCER CENTER ONLY)
ALT: 28 U/L (ref 0–44)
AST: 28 U/L (ref 15–41)
Albumin: 3.7 g/dL (ref 3.5–5.0)
Alkaline Phosphatase: 113 U/L (ref 38–126)
Anion gap: 7 (ref 5–15)
BUN: 18 mg/dL (ref 8–23)
CO2: 23 mmol/L (ref 22–32)
Calcium: 9.9 mg/dL (ref 8.9–10.3)
Chloride: 107 mmol/L (ref 98–111)
Creatinine: 0.81 mg/dL (ref 0.44–1.00)
GFR, Est AFR Am: >60 mL/min (ref >60)
GFR, Estimated: >60 mL/min (ref >60)
Glucose, Bld: 98 mg/dL (ref 70–99)
Potassium: 4.5 mmol/L (ref 3.5–5.1)
Sodium: 137 mmol/L (ref 135–145)
Total Bilirubin: 0.8 mg/dL (ref 0.3–1.2)
Total Protein: 6.8 g/dL (ref 6.5–8.1)

## 2020-03-23 NOTE — Progress Notes (Signed)
Hematology and Oncology Follow Up Visit  Heather Brown [id number] 1945/08/28 73 y.o. 03/23/2020   Principle Diagnosis:  Polycythemia vera -- DNMT3A (+),  Epo = 5.7  Current Therapy:        Phlebotomy to maintain hematocrit below 45% Aspirin 81 mg PO daily   Interim History:  Heather Brown is here today for follow-up. She is doing well but does note fatigue. She states this is likely due to helping care for her husband.  She takes a break to rest when she can.  Hct today is stable at 41.9%.  She has a ruptured silicone breast implants and will be having these removed and replaced on 04/23/2020 by Dr. Loletha Grayer. [place].  No fever, chills, n/v, cough, rash, SOB, chest pain, palpitations or changes in bowel or bladder habits.  She has twinges of pain in her abdomen every once in a while if she bends over or to the side a certain way. She states that this does not happen often.  She has occasional episodes of dizziness and plans to follow-up with her PCP to have her ears cleaned out. She states that this has happened twice before.  No swelling, tenderness, numbness or tingling in her extremities.  No falls or syncope.  She has maintained a good appetite and is staying well hydrated. Her weight is stable.   ECOG Performance Status: 1 - Symptomatic but completely ambulatory  Medications:  Allergies as of 03/23/2020      Reactions   Sulfa Antibiotics Hives      Medication List       Accurate as of March 23, 2020 12:06 PM. If you have any questions, ask your nurse or doctor.        aspirin 81 MG tablet Take 81 mg by mouth daily.   Biotin 2500 MCG Caps Take by mouth. Three times weekly   CALCIUM CARBONATE ANTACID PO Take 1,000 mg by mouth daily as needed for indigestion or heartburn.   fish oil-omega-3 fatty acids 1000 MG capsule Take 1 g by mouth daily. With 360 mg omega-3   glucosamine-chondroitin 500-400 MG tablet Take 1 tablet by mouth daily.   levothyroxine 75 MCG  tablet Commonly known as: SYNTHROID 75 mcg daily.   loratadine 10 MG tablet Commonly known as: CLARITIN Take 10 mg by mouth daily as needed for allergies.   multivitamin capsule Take 1 capsule by mouth daily.   tretinoin 0.05 % cream Commonly known as: RETIN-A APPLY PEA SIZED AMOUNT TO AFFECTED AREA OF FACE NIGHTLY   triamcinolone ointment 0.5 % Commonly known as: KENALOG Apply 1 application topically 2 (two) times daily.   Vitamin D3 25 MCG (1000 UT) Caps Take 1,000 Units by mouth daily.       Allergies:  Allergies  Allergen Reactions  . Sulfa Antibiotics Hives    Past Medical History, Surgical history, Social history, and Family History were reviewed and updated.  Review of Systems: All other 10 point review of systems is negative.   Physical Exam:  vitals were not taken for this visit.   Wt Readings from Last 3 Encounters:  01/11/20 165 lb 3.2 oz (74.9 kg)  11/24/19 165 lb (74.8 kg)  09/11/19 165 lb (74.8 kg)    Ocular: Sclerae unicteric, pupils equal, round and reactive to light Ear-nose-throat: Oropharynx clear, dentition fair Lymphatic: No cervical or supraclavicular adenopathy Lungs no rales or rhonchi, good excursion bilaterally Heart regular rate and rhythm, no murmur appreciated Abd soft, nontender, positive bowel sounds, no  liver or spleen tip palpated on exam, no fluid wave  MSK no focal spinal tenderness, no joint edema Neuro: non-focal, well-oriented, appropriate affect Breasts: Deferred   Lab Results  Component Value Date   WBC 3.9 (L) 03/23/2020   HGB 12.9 03/23/2020   HCT 41.9 03/23/2020   MCV 77.7 (L) 03/23/2020   PLT 313 03/23/2020   Lab Results  Component Value Date   FERRITIN 6 (L) 11/24/2019   IRON 60 11/24/2019   TIBC 454 (H) 11/24/2019   UIBC 394 (H) 11/24/2019   IRONPCTSAT 13 (L) 11/24/2019   Lab Results  Component Value Date   RETICCTPCT 0.9 10/01/2017   RBC 5.39 (H) 03/23/2020   No results found for: KPAFRELGTCHN,  LAMBDASER, KAPLAMBRATIO No results found for: IGGSERUM, IGA, IGMSERUM No results found for: Odetta Pink, SPEI   Chemistry      Component Value Date/Time   NA 142 11/24/2019 1002   NA 140 08/19/2017 1517   K 4.3 11/24/2019 1002   CL 109 11/24/2019 1002   CO2 27 11/24/2019 1002   BUN 18 11/24/2019 1002   BUN 15 08/19/2017 1517   CREATININE 0.73 11/24/2019 1002   CREATININE 0.79 03/10/2014 0836      Component Value Date/Time   CALCIUM 10.9 (H) 11/24/2019 1002   ALKPHOS 122 11/24/2019 1002   AST 31 11/24/2019 1002   ALT 40 11/24/2019 1002   BILITOT 1.0 11/24/2019 1002       Impression and Plan: Heather Brown is a very pleasant 74 yo caucasian female with polycythemia vera.  No phlebotomy needed this visit .  We will plan to see her again in another 2 months and see how she is doing post surgery.  She can contact our office with any questions or concerns.  Laverna Peace, NP 8/25/202112:06 PM

## 2020-03-23 NOTE — Telephone Encounter (Signed)
Appointments scheduled and patient was ok with both date/time per 8/25 los

## 2020-03-24 LAB — FERRITIN: Ferritin: 8 ng/mL — ABNORMAL LOW (ref 11–307)

## 2020-03-24 LAB — IRON AND TIBC
Iron: 48 ug/dL (ref 41–142)
Saturation Ratios: 11 % — ABNORMAL LOW (ref 21–57)
TIBC: 450 ug/dL — ABNORMAL HIGH (ref 236–444)
UIBC: 402 ug/dL — ABNORMAL HIGH (ref 120–384)

## 2020-03-24 LAB — TSH: TSH: 1.01 u[IU]/mL (ref 0.308–3.960)

## 2020-03-25 ENCOUNTER — Ambulatory Visit: Payer: Medicare Other | Admitting: Hematology & Oncology

## 2020-03-25 ENCOUNTER — Other Ambulatory Visit: Payer: Medicare Other

## 2020-03-28 NOTE — Progress Notes (Addendum)
ICD-10-CM   1. Ruptured silicone breast implant, initial encounter  T85.43XA       Patient ID: Heather Brown, female    DOB: 09-May-1946, 74 y.o.   MRN: [id number]   History of Present Illness: Heather Brown is a 74 y.o.  female  with a history of ruptured breast implants.  She presents for preoperative evaluation for upcoming procedure, removal of bilateral ruptured implants with replacement of breast implants, scheduled for 04/21/2020 at [facility] with Dr. Marla Roe.  Summary from previous visit: Patient had silicone implants placed in [place] in the 1970s.  She is unsure what size they are.  She is 5 feet 2 inches tall and weighs 165 pounds.  She had a mammogram 2 years ago which was negative other than showing ruptured implants.  She had other surgery by Dr. Stephanie Coup.  The bra she is wearing today is a 38DD. Patient would like to remain a similar size, and definitely not larger.  PMH Significant for: Polycythemia vera, hypothyroid, fatty liver ([place] 2018)  The patient has had problems with anesthesia.  PONV  Past Medical History: Allergies: Allergies  Allergen Reactions  . Sulfa Antibiotics Hives    Current Medications:  Current Outpatient Medications:  .  aspirin 81 MG tablet, Take 81 mg by mouth daily., Disp: , Rfl:  .  Biotin 2500 MCG CAPS, Take by mouth. Three times weekly, Disp: , Rfl:  .  Cholecalciferol (VITAMIN D3) 1000 UNITS CAPS, Take 1,000 Units by mouth daily., Disp: , Rfl:  .  glucosamine-chondroitin 500-400 MG tablet, Take 1 tablet by mouth daily., Disp: , Rfl:  .  levothyroxine (SYNTHROID) 75 MCG tablet, 75 mcg daily., Disp: , Rfl:  .  loratadine (CLARITIN) 10 MG tablet, Take 10 mg by mouth daily as needed for allergies. , Disp: , Rfl:  .  Multiple Vitamin (MULTIVITAMIN) capsule, Take 1 capsule by mouth daily., Disp: , Rfl:  .  tretinoin (RETIN-A) 0.05 % cream, APPLY PEA SIZED AMOUNT TO AFFECTED AREA OF FACE NIGHTLY, Disp: , Rfl: 5 .   triamcinolone ointment (KENALOG) 0.5 %, Apply 1 application topically 2 (two) times daily., Disp: 30 g, Rfl: 2 .  CALCIUM CARBONATE ANTACID PO, Take 1,000 mg by mouth daily as needed for indigestion or heartburn.  (Patient not taking: Reported on 03/29/2020), Disp: , Rfl:  .  fish oil-omega-3 fatty acids 1000 MG capsule, Take 1 g by mouth daily. With 360 mg omega-3 (Patient not taking: Reported on 03/29/2020), Disp: , Rfl:   Past Medical Problems: Past Medical History:  Diagnosis Date  . Dyspareunia    dryness with intercourse  . Endometriosis 1982   found at [place]  . GERD (gastroesophageal reflux disease)   . Heart murmur   . History of left shoulder fracture 2017  . Hypothyroidism   . Left ACL tear   . Osteopenia 2009  . Pneumonia   . Polycythemia vera ([place]) 12/02/2017  . PONV (postoperative nausea and vomiting)   . Thyroid nodule    followed by Dr Chalmers Cater  . Urinary incontinence    with coughing/sneezing    Past Surgical History: Past Surgical History:  Procedure Laterality Date  . ABDOMINAL HYSTERECTOMY  1982   TA[facility]--for abnormal paps/endometriosis  . AUGMENTATION MAMMAPLASTY  1977  . COLONOSCOPY WITH PROPOFOL N/A 10/29/2013   Procedure: COLONOSCOPY WITH PROPOFOL;  Surgeon: Jeryl Columbia, MD;  Location: [facility] ENDOSCOPY;  Service: Endoscopy;  Laterality: N/A;  . COLONOSCOPY WITH PROPOFOL N/A 12/22/2014  Procedure: COLONOSCOPY WITH PROPOFOL;  Surgeon: Clarene Essex, MD;  Location: [facility] ENDOSCOPY;  Service: Endoscopy;  Laterality: N/A;  ultra slim scope  . COSMETIC SURGERY  11/2011   face lift,neck lift, eye lift--Dr. Stephanie Coup  . dilatation and curettage  1982  . HOT HEMOSTASIS N/A 12/22/2014   Procedure: HOT HEMOSTASIS (ARGON PLASMA COAGULATION/BICAP);  Surgeon: Clarene Essex, MD;  Location: [facility] ENDOSCOPY;  Service: Endoscopy;  Laterality: N/A;  . TONSILLECTOMY      Social History: Social History   Socioeconomic History  . Marital status: Married    Spouse name: Not on file  . Number of  children: Not on file  . Years of education: Not on file  . Highest education level: Not on file  Occupational History  . Not on file  Tobacco Use  . Smoking status: Never Smoker  . Smokeless tobacco: Never Used  Vaping Use  . Vaping Use: Never used  Substance and Sexual Activity  . Alcohol use: Yes    Alcohol/week: 0.0 standard drinks    Comment: wine 1 glass monthly, if that  . Drug use: No  . Sexual activity: Yes    Partners: Male    Birth control/protection: Surgical    Comment: TAH  Other Topics Concern  . Not on file  Social History Narrative  . Not on file   Social Determinants of Health   Financial Resource Strain:   . Difficulty of Paying Living Expenses: Not on file  Food Insecurity:   . Worried About Charity fundraiser in the Last Year: Not on file  . Ran Out of Food in the Last Year: Not on file  Transportation Needs:   . Lack of Transportation (Medical): Not on file  . Lack of Transportation (Non-Medical): Not on file  Physical Activity:   . Days of Exercise per Week: Not on file  . Minutes of Exercise per Session: Not on file  Stress:   . Feeling of Stress : Not on file  Social Connections:   . Frequency of Communication with Friends and Family: Not on file  . Frequency of Social Gatherings with Friends and Family: Not on file  . Attends Religious Services: Not on file  . Active Member of Clubs or Organizations: Not on file  . Attends Archivist Meetings: Not on file  . Marital Status: Not on file  Intimate Partner Violence:   . Fear of Current or Ex-Partner: Not on file  . Emotionally Abused: Not on file  . Physically Abused: Not on file  . Sexually Abused: Not on file    Family History: Family History  Problem Relation Age of Onset  . Cancer Mother   . Cervical cancer Mother   . Diabetes Father        adult onset  . Stroke Father   . Thyroid disease Sister        hyperthyroid  . Asthma Sister   . Thyroid disease Sister         hypothyroid  . Cancer Maternal Grandmother   . Stomach cancer Maternal Grandmother     Review of Systems: Review of Systems  Constitutional: Negative for chills and fever.  HENT: Negative for congestion and sore throat.   Respiratory: Negative for cough and shortness of breath.   Cardiovascular: Negative for chest pain and palpitations.  Gastrointestinal: Negative for abdominal pain, nausea and vomiting.  Musculoskeletal: Negative for back pain, joint pain, myalgias and neck pain.  Skin: Negative for itching and  rash.    Physical Exam: Vital Signs BP 121/78 (BP Location: Left Arm, Patient Position: Sitting, Cuff Size: Large)   Pulse 89   Temp 98 F (36.7 C) (Oral)   Ht 5\' 2"  (1.575 m)   Wt 161 lb (73 kg)   LMP 07/30/1980   SpO2 97%   BMI 29.45 kg/m  Physical Exam Vitals and nursing note reviewed.  Constitutional:      General: She is not in acute distress.    Appearance: Normal appearance. She is normal weight. She is not ill-appearing.  HENT:     Head: Normocephalic and atraumatic.  Eyes:     Extraocular Movements: Extraocular movements intact.  Cardiovascular:     Rate and Rhythm: Normal rate and regular rhythm.     Pulses: Normal pulses.     Heart sounds: Normal heart sounds.  Pulmonary:     Effort: Pulmonary effort is normal.     Breath sounds: Normal breath sounds. No wheezing, rhonchi or rales.  Abdominal:     General: Bowel sounds are normal.     Palpations: Abdomen is soft.  Musculoskeletal:        General: No swelling. Normal range of motion.     Cervical back: Normal range of motion.  Skin:    General: Skin is warm and dry.     Coloration: Skin is not pale.     Findings: No erythema or rash.  Neurological:     General: No focal deficit present.     Mental Status: She is alert and oriented to person, place, and time.  Psychiatric:        Mood and Affect: Mood normal.        Behavior: Behavior normal.        Thought Content: Thought content normal.         Judgment: Judgment normal.     Assessment/Plan:  Ms. Seel scheduled for removal of bilateral ruptured implants with replacement of breast implants with Dr. Marla Roe.  Risks, benefits, and alternatives of procedure discussed, questions answered and consent obtained.    Smoking Status: non-smoker; Counseling Given? N/A   Last Mammogram: 02/02/2020; Results: Retropectoral right breast implant and prepectoral left breast implant.  No findings suspicious for malignancy.  Surgical Clearance Request sent to Dr. Marin Olp @ [place] at [facility]; including guidance on ASA.  Caprini Score: 8 High; Risk Factors include: 74 year old female, polycythemia vera, BMI > 25, and length of planned surgery. Recommendation for mechanical and pharmacological prophylaxis during surgery. Encourage early ambulation.   Pictures obtained: 01/11/2020  Post-op Rx sent to pharmacy: Keflex, phenergan , IBU 600 mg * Patient's daughter died of a drug overdose so patient does not want any narcotic pain medications. * Patient has fatty liver so Oncology advised to avoid Tylenol  Patient was provided with the General Surgical Risk consent document and Pain Medication Agreement prior to their appointment.  They had adequate time to read through the risk consent documents and Pain Medication Agreement. We also discussed them in person together during this preop appointment. All of their questions were answered to their satisfaction.  Recommended calling if they have any further questions.  Risk consent form and Pain Medication Agreement to be scanned into patient's chart.  The risk that can be encountered with breast augmentation or maastopexy were discussed and include the following but not limited to these:  Breast asymmetry, fluid accumulation, firmness of the breast, inability to breast feed, loss of nipple or areola,  skin loss, decrease or no nipple sensation, fat necrosis of the breast tissue,  bleeding, infection, healing delay.  Deep vein thrombosis, cardiac and pulmonary complications are risks to any procedure. The implant can have a faulty position or one different from what you had desired.  The implant can have rippling, wrinkling, leakage or rupture. There are risks of anesthesia, changes to skin sensation and injury to nerves or blood vessels.  The muscle can be temporarily or permanently injured.  You may have an allergic reaction to tape, suture, glue, blood products which can result in skin discoloration, swelling, pain, skin lesions, poor healing.  Any of these can lead to the need for revisonal surgery or stage procedures.  A reduction has potential to interfere with diagnostic procedures.  Nipple or breast piercing can increase risks of infection.    This procedure is best done when the breast is fully developed.  Changes in the breast will continue to occur over time.  Pregnancy can alter the outcomes of previous breast reduction surgery, weight gain and weigh loss can also effect the long term appearance. Implants are not guaranteed to last a lifetime.  Future surgery may be required.  Regular examinations of the breast are required to evaluate the condition of your breasts and implants.  Electronically signed by: Threasa Heads, PA-C 03/29/2020 10:06 AM

## 2020-03-29 ENCOUNTER — Ambulatory Visit (INDEPENDENT_AMBULATORY_CARE_PROVIDER_SITE_OTHER): Payer: Medicare Other | Admitting: Plastic Surgery

## 2020-03-29 ENCOUNTER — Other Ambulatory Visit: Payer: Self-pay

## 2020-03-29 ENCOUNTER — Encounter: Payer: Self-pay | Admitting: Plastic Surgery

## 2020-03-29 VITALS — BP 121/78 | HR 89 | Temp 98.0°F | Ht 62.0 in | Wt 161.0 lb

## 2020-03-29 DIAGNOSIS — T8543XA Leakage of breast prosthesis and implant, initial encounter: Secondary | ICD-10-CM

## 2020-03-29 MED ORDER — PROMETHAZINE HCL 12.5 MG PO TABS: 12.5000 mg | ORAL_TABLET | Freq: Three times a day (TID) | ORAL | 0 refills | Status: DC | PRN

## 2020-03-29 MED ORDER — CEPHALEXIN 500 MG PO CAPS: 500.0000 mg | ORAL_CAPSULE | Freq: Four times a day (QID) | ORAL | 0 refills | Status: AC

## 2020-03-29 MED ORDER — ONDANSETRON HCL 4 MG PO TABS: 4.0000 mg | ORAL_TABLET | Freq: Three times a day (TID) | ORAL | 0 refills | Status: DC | PRN

## 2020-03-29 MED ORDER — IBUPROFEN 600 MG PO TABS: 600.0000 mg | ORAL_TABLET | Freq: Four times a day (QID) | ORAL | 0 refills | Status: DC | PRN

## 2020-03-29 NOTE — Addendum Note (Signed)
Addended by: Phoebe Sharps on: 03/29/2020 04:20 PM   Modules accepted: Orders

## 2020-03-30 ENCOUNTER — Telehealth: Payer: Self-pay

## 2020-03-30 NOTE — Telephone Encounter (Signed)
Request for surgical clearance signed by Dr Marin Olp with instructions for pt to hold ASA from 9/17 to 9/23 & restart 9/24 faxed to [facility]. dph

## 2020-04-01 ENCOUNTER — Encounter: Payer: Self-pay | Admitting: Plastic Surgery

## 2020-04-01 NOTE — Progress Notes (Signed)
Surgical Clearance has been received from Dr. Marin Olp, [place] for patient's upcoming surgery Removal of bilateral implants with replacement implants with Dr. Marla Roe on 04/21/20 . Medications to hold prior to surgery ASA (Stop taking: 04/15/20 Begin retaking 04/22/20)   Patient notified.

## 2020-04-06 DIAGNOSIS — H40013 Open angle with borderline findings, low risk, bilateral: Secondary | ICD-10-CM | POA: Diagnosis not present

## 2020-04-07 DIAGNOSIS — H6120 Impacted cerumen, unspecified ear: Secondary | ICD-10-CM | POA: Diagnosis not present

## 2020-04-11 ENCOUNTER — Telehealth: Payer: Self-pay | Admitting: Plastic Surgery

## 2020-04-11 NOTE — Telephone Encounter (Signed)
Patient called to find out what medications she is supposed to stop taking before surgery. Please call to advise.

## 2020-04-11 NOTE — Telephone Encounter (Signed)
Returned pt's call re:  her medication and her surgery I instructed  her to hold ASA beginning on 04/15/20 and she can resume it 04/23/20- per Medical Clearance from Dr. Marin Olp Pt understands this order & she is reminded to discuss her other meds with anesthesia/pre-op assessment team when they call her prior to surgery on 04/22/20.

## 2020-04-18 ENCOUNTER — Other Ambulatory Visit: Payer: Self-pay

## 2020-04-18 ENCOUNTER — Ambulatory Visit
Admission: RE | Admit: 2020-04-18 | Discharge: 2020-04-18 | Disposition: A | Discharge status: Home / Self Care | Payer: Medicare Other | Source: Ambulatory Visit | Attending: Obstetrics & Gynecology | Admitting: Obstetrics & Gynecology

## 2020-04-18 DIAGNOSIS — M810 Age-related osteoporosis without current pathological fracture: Secondary | ICD-10-CM

## 2020-04-18 DIAGNOSIS — Z780 Asymptomatic menopausal state: Secondary | ICD-10-CM | POA: Diagnosis not present

## 2020-04-18 DIAGNOSIS — M8588 Other specified disorders of bone density and structure, other site: Secondary | ICD-10-CM | POA: Diagnosis not present

## 2020-04-20 ENCOUNTER — Telehealth: Payer: Self-pay | Admitting: *Deleted

## 2020-04-20 NOTE — Telephone Encounter (Signed)
Patient is returning a call to Emily. °

## 2020-04-20 NOTE — Telephone Encounter (Signed)
-----   Message from Megan Salon, MD sent at 04/20/2020 12:03 PM EDT ----- Please let pt know her right hip has changed a little and is now in very early osteoporosis range.  Her spine measurement is the same.  The left hip (where the osteoporosis was present last time) is stable.  She did not want any treatment 2 years ago and is this really isn't much different, I think she will decline again.  I am ok with continuing to watch this and repeating in 2 years.  Thanks.

## 2020-04-20 NOTE — Telephone Encounter (Signed)
Message left to return call to Nattalie Santiesteban at [phone].    

## 2020-04-20 NOTE — Telephone Encounter (Signed)
Returned call to patient. Patient advised of message as seen below from Dr. Sabra Heck. Patient states she would like to discuss options with Dr. Sabra Heck. OV scheduled for 05-17-20 at 1630. Patient agreeable to date and time of appointment.   Encounter closed.

## 2020-04-22 ENCOUNTER — Other Ambulatory Visit: Payer: Self-pay | Admitting: Plastic Surgery

## 2020-04-22 ENCOUNTER — Telehealth: Payer: Self-pay | Admitting: Plastic Surgery

## 2020-04-22 DIAGNOSIS — T8541XA Breakdown (mechanical) of breast prosthesis and implant, initial encounter: Secondary | ICD-10-CM | POA: Diagnosis not present

## 2020-04-22 DIAGNOSIS — T8549XA Other mechanical complication of breast prosthesis and implant, initial encounter: Secondary | ICD-10-CM | POA: Diagnosis not present

## 2020-04-22 MED ORDER — CEPHALEXIN 500 MG PO CAPS: 500.0000 mg | ORAL_CAPSULE | Freq: Four times a day (QID) | ORAL | 0 refills | Status: AC

## 2020-04-22 MED ORDER — IBUPROFEN 600 MG PO TABS: 600.0000 mg | ORAL_TABLET | Freq: Four times a day (QID) | ORAL | 0 refills | Status: DC | PRN

## 2020-04-22 MED ORDER — PROMETHAZINE HCL 12.5 MG PO TABS: 12.5000 mg | ORAL_TABLET | Freq: Three times a day (TID) | ORAL | 0 refills | Status: DC | PRN

## 2020-04-22 NOTE — Telephone Encounter (Signed)
Patient called about getting a refill on the ibuprofen/advil. Pharmacy says they picked it up, but she doesn't remember it.  Patient requested a prescription for zofran and the antiobiotic to be called in also. Patient uses [facility] on [address].

## 2020-04-22 NOTE — Telephone Encounter (Signed)
Patient's prescriptions were sent to the pharmacy during her preoperative evaluation per EMR review.  I will send additional prescription

## 2020-04-24 ENCOUNTER — Encounter: Payer: Self-pay | Admitting: Plastic Surgery

## 2020-04-25 ENCOUNTER — Telehealth: Payer: Self-pay | Admitting: Plastic Surgery

## 2020-04-25 NOTE — Telephone Encounter (Signed)
Patient left message asking if should leave the tape under breast and not mess with it. She was told she could take a bath today. Surgery was Friday. Please call patient to advise if this is correct or if she should do something different. A message can be left if she does not answer.

## 2020-04-25 NOTE — Telephone Encounter (Signed)
Called and spoke with the patient regarding the message below.  She stated that she was informed to take a shower on Monday,but she had questions regarding taking the tape off.  Informed the patient that yes she can take a shower, but do not take any tape off.  Patient verbalized understanding and agreed.//AB/CMA

## 2020-04-28 NOTE — Progress Notes (Signed)
Subjective:     Patient ID: Heather Brown, female    DOB: March 15, 1946, 74 y.o.   MRN: [id number]  Chief Complaint  Patient presents with  . Post-op Follow-up    HPI: The patient is a 74 y.o. female here for follow-up after undergoing removal of ruptured breast implants with replacement of breast implants on 04/21/2020 at [facility] with Dr. Marla Roe.  ~ 1 week PO Patient reports she is doing well overall.  Has some tenderness where there is a fold on the Aquacel dressing on her right breast.  Dressings removed today.  She has some bilateral bruising present; right more than left.  Incisions are healing very well, C/D/I.  No signs of infection, redness, drainage, seroma/hematoma.  Patient denies fever/chills, nausea/vomiting, chest pain, shortness of breath.  Review of Systems  Constitutional: Negative for chills and fever.  Respiratory: Negative for shortness of breath.   Cardiovascular: Negative for chest pain.  Gastrointestinal: Negative for constipation, diarrhea, nausea and vomiting.  Skin: Negative for rash.     Objective:   Vital Signs BP (!) 147/82 (BP Location: Right Arm, Patient Position: Sitting, Cuff Size: Normal)   Pulse 76   Temp 97.7 F (36.5 C) (Oral)   LMP 07/30/1980   SpO2 98%  Vital Signs and Nursing Note Reviewed  Physical Exam Constitutional:      General: She is not in acute distress.    Appearance: Normal appearance. She is normal weight. She is not ill-appearing.  HENT:     Head: Normocephalic and atraumatic.  Eyes:     Extraocular Movements: Extraocular movements intact.  Cardiovascular:     Rate and Rhythm: Normal rate.  Pulmonary:     Effort: Pulmonary effort is normal.  Chest:     Comments: Bilateral incisions are healing very nicely, C/D/I.  No signs of infection, redness, drainage, seroma/hematoma. Musculoskeletal:        General: Normal range of motion.     Cervical back: Normal range of motion.  Skin:    General: Skin is warm and  dry.     Coloration: Skin is not pale.     Findings: No erythema or rash.  Neurological:     Mental Status: She is alert and oriented to person, place, and time.     Gait: Gait is intact.  Psychiatric:        Mood and Affect: Mood and affect normal.        Behavior: Behavior normal.        Thought Content: Thought content normal.        Cognition and Memory: Memory normal.        Judgment: Judgment normal.       Assessment/Plan:     ICD-10-CM   1. Ruptured silicone breast implant, subsequent encounter  T85.43XD    Patient is doing well.  Bilateral incisions are healing very nicely, C/D/I.  No signs of infection, redness, drainage, seroma/hematoma.  Bilateral bruising present, right more than left.  Dressings were removed today.  Continue to wear compression garment 24/7 until 6 weeks postop.  Continue to avoid heavy lifting.  May shower, avoid scrubbing around incisions.  Follow-up in 1 week.  Call office with any questions/concerns.  The [place] was signed into law in 2016 which includes the topic of electronic health records.  This provides immediate access to information in MyChart.  This includes consultation notes, operative notes, office notes, lab results and pathology reports.  If you have any  questions about what you read please let us know at your next visit or call us at the office.  We are right here with you.   Threasa Heads, PA-C 04/29/2020, 1:05 PM

## 2020-04-29 ENCOUNTER — Other Ambulatory Visit: Payer: Self-pay

## 2020-04-29 ENCOUNTER — Ambulatory Visit (INDEPENDENT_AMBULATORY_CARE_PROVIDER_SITE_OTHER): Payer: Self-pay | Admitting: Plastic Surgery

## 2020-04-29 ENCOUNTER — Encounter: Payer: Self-pay | Admitting: Plastic Surgery

## 2020-04-29 VITALS — BP 147/82 | HR 76 | Temp 97.7°F

## 2020-04-29 DIAGNOSIS — T8543XD Leakage of breast prosthesis and implant, subsequent encounter: Secondary | ICD-10-CM

## 2020-05-05 DIAGNOSIS — Z0120 Encounter for dental examination and cleaning without abnormal findings: Secondary | ICD-10-CM | POA: Diagnosis not present

## 2020-05-06 ENCOUNTER — Ambulatory Visit (INDEPENDENT_AMBULATORY_CARE_PROVIDER_SITE_OTHER): Payer: Medicare Other | Admitting: Plastic Surgery

## 2020-05-06 ENCOUNTER — Encounter: Payer: Self-pay | Admitting: Plastic Surgery

## 2020-05-06 ENCOUNTER — Other Ambulatory Visit: Payer: Self-pay

## 2020-05-06 VITALS — BP 144/82 | HR 77 | Temp 97.9°F

## 2020-05-06 DIAGNOSIS — T8543XD Leakage of breast prosthesis and implant, subsequent encounter: Secondary | ICD-10-CM

## 2020-05-06 NOTE — Progress Notes (Signed)
   Subjective:    Patient ID: Heather Brown, female    DOB: 1945-08-15, 74 y.o.   MRN: [id number]  The patient is a 74 year old female here for follow-up after having her ruptured implants removed.  She had new implants placed.  Overall she is doing extremely well.  There is no sign of infection or seroma.  Her incisions are healing very nicely.  She still a little bit sore.  She has a dry area on her back that has been bothering her.  It looks like dry skin and not a rash.  I put some Vaseline on it and that seemed to help.  She is very pleased with the size.  She thinks it is a little bit fuller than her previous implants.  It would expect them to feel different since the previous ones were ruptured.     Review of Systems  Constitutional: Negative.   HENT: Negative.   Eyes: Negative.   Respiratory: Negative.   Genitourinary: Negative.        Objective:   Physical Exam Vitals and nursing note reviewed.  Constitutional:      Appearance: Normal appearance.  HENT:     Head: Normocephalic and atraumatic.  Cardiovascular:     Rate and Rhythm: Normal rate.     Pulses: Normal pulses.  Pulmonary:     Effort: Pulmonary effort is normal.  Neurological:     General: No focal deficit present.     Mental Status: She is alert. Mental status is at baseline.  Psychiatric:        Mood and Affect: Mood normal.        Behavior: Behavior normal.           Assessment & Plan:     ICD-10-CM   1. Ruptured silicone breast implant, subsequent encounter  T85.43XD     We will plan on pictures at the next visit.  Continue with sports bra.  She should be able to start sleeping in the bed by Monday.  I would wait another week before any heavy activity.  I would like to see her back in 2 weeks.

## 2020-05-13 NOTE — Progress Notes (Signed)
Subjective:    72 yrs Married Caucasian G2P2002  female here to discuss recent BMD obtained 04/18/20 showing osteoporosis in her right hip with t score -2.6.  This changed from imaging done 08/08/2017 with t score of -2.3  Spine measurement still showed osteopenia with stable t score of -2.4 .  BMD discussed.  Evaluation for secondary causes discussed.  Treatment option discussed.  Pt and I discussed increasing exercise and optimizing Vit D and calcium intake.  She would prefer no additional medications at this time.  Osteoporosis Risk Factors  Nonmodifiable Personal Hx of fracture as an adult: yes - about three years ago, fell and fractured her humerus  Caucasian race: yes Advanced age: yes Female sex: yes Dementia: no Poor health/frailty: no  Potentially modifiable: Tobacco use: no Low body weight (<127 lbs): no Estrogen deficiency:  Had hysterectomy in 1982.  Pt did not go on hormonal therapy after the surgery but unsure if ovaries were removed.    Low calcium intake (lifelong): no Alcohol use more than 2 drinks per day: no Recurrent falls: no Inadequate physical activity: not exercising  Current calcium and Vit D intake: Vit D 1000 IU daily and 225mg  calcium  Review of Systems A comprehensive review of systems was negative.     Objective:   PHYSICAL EXAM BP 110/70   Pulse 68   Resp 16   Wt 165 lb (74.8 kg)   LMP 07/30/1980   BMI 30.18 kg/m  General appearance: alert and no distress  Imaging Bone Density: Spine T Score: -2.4, stable from 2019, Hip T Score: -2.6 which decreased from -2.3 in 2019.                                  Assessment:   Osteoporosis with T score -2.6 that changed from -2.3 in 2019   Plan:   1.  Appropriate calcium and Vit D supplementation discussed. 2.  Exercise recommended at least 30 minutes 3 times per week.  She is doing no exercise so is really going to need to work on this. 3.  Fall prevention discussed. 6.  Pharmacologic therapy  therapy below discussed including risks and benefits:   Bisphosphonates po (Fosamax, Actonel, Boniva)  Bisphosphonate IV (Reclast)  Evista  Prolia subcutaneous  7.  Repeat bone density in 2 years.     23 minutes spent with pt in discussion of result, options for treatment, fracture risks, follow up and with documentation.

## 2020-05-17 ENCOUNTER — Ambulatory Visit: Payer: Medicare Other | Admitting: Obstetrics & Gynecology

## 2020-05-17 ENCOUNTER — Encounter: Payer: Self-pay | Admitting: Obstetrics & Gynecology

## 2020-05-17 ENCOUNTER — Other Ambulatory Visit: Payer: Self-pay

## 2020-05-17 VITALS — BP 110/70 | HR 68 | Resp 16 | Wt 165.0 lb

## 2020-05-17 DIAGNOSIS — M810 Age-related osteoporosis without current pathological fracture: Secondary | ICD-10-CM | POA: Diagnosis not present

## 2020-05-19 NOTE — Progress Notes (Signed)
Patient is a 74 year old female here for follow-up after removal of ruptured implants and placement of new implants with Dr. Marla Roe on 04/21/2020.  Patient is overall doing well, she reports some tenderness to the left NAC.  She also reports some tenderness to her left and right inframammary fold incisions where the sutures have been rubbing against her sports bra.  She is otherwise doing well.  Chaperone present on exam On exam bilateral inframammary fold incisions are intact, well-healed, Monocryl suture knot noted.  No erythema noted.  No dehiscence noted.  No swelling noted.  Bilateral breasts are symmetric, soft, no fluid wave noted.  Bilateral NAC's viable with good color.  Recommend continuing to wear sports bra 24/7 for 2 more weeks, continue to wear sports bra throughout the day for 1 more month. No sign of infection, seroma, hematoma Avoid strenuous activity for 2 more weeks. Recommend following up in 3 months for reevaluation Recommend calling with any questions or concerns.  Pictures were obtained of the patient and placed in the chart with the patient's or guardian's permission.

## 2020-05-20 ENCOUNTER — Encounter: Payer: Self-pay | Admitting: Surgical

## 2020-05-20 ENCOUNTER — Other Ambulatory Visit: Payer: Self-pay

## 2020-05-20 ENCOUNTER — Ambulatory Visit (INDEPENDENT_AMBULATORY_CARE_PROVIDER_SITE_OTHER): Payer: Medicare Other | Admitting: Surgical

## 2020-05-20 VITALS — BP 125/77 | HR 74 | Temp 98.2°F

## 2020-05-20 DIAGNOSIS — T8543XD Leakage of breast prosthesis and implant, subsequent encounter: Secondary | ICD-10-CM

## 2020-05-22 DIAGNOSIS — M810 Age-related osteoporosis without current pathological fracture: Secondary | ICD-10-CM | POA: Insufficient documentation

## 2020-05-24 ENCOUNTER — Inpatient Hospital Stay: Payer: Medicare Other | Attending: Hematology & Oncology

## 2020-05-24 ENCOUNTER — Encounter: Payer: Self-pay | Admitting: Family

## 2020-05-24 ENCOUNTER — Telehealth: Payer: Self-pay | Admitting: Family

## 2020-05-24 ENCOUNTER — Other Ambulatory Visit: Payer: Medicare Other

## 2020-05-24 ENCOUNTER — Inpatient Hospital Stay (HOSPITAL_BASED_OUTPATIENT_CLINIC_OR_DEPARTMENT_OTHER): Payer: Medicare Other | Admitting: Family

## 2020-05-24 ENCOUNTER — Other Ambulatory Visit: Payer: Self-pay

## 2020-05-24 ENCOUNTER — Ambulatory Visit: Payer: Medicare Other | Admitting: Hematology & Oncology

## 2020-05-24 VITALS — BP 132/68 | HR 74 | Temp 98.0°F | Resp 18 | Wt 162.1 lb

## 2020-05-24 DIAGNOSIS — D500 Iron deficiency anemia secondary to blood loss (chronic): Secondary | ICD-10-CM

## 2020-05-24 DIAGNOSIS — D45 Polycythemia vera: Secondary | ICD-10-CM | POA: Diagnosis not present

## 2020-05-24 DIAGNOSIS — Z7982 Long term (current) use of aspirin: Secondary | ICD-10-CM | POA: Diagnosis not present

## 2020-05-24 LAB — CMP (CANCER CENTER ONLY)
ALT: 20 U/L (ref 0–44)
AST: 19 U/L (ref 15–41)
Albumin: 4.1 g/dL (ref 3.5–5.0)
Alkaline Phosphatase: 120 U/L (ref 38–126)
Anion gap: 5 (ref 5–15)
BUN: 16 mg/dL (ref 8–23)
CO2: 28 mmol/L (ref 22–32)
Calcium: 10.7 mg/dL — ABNORMAL HIGH (ref 8.9–10.3)
Chloride: 106 mmol/L (ref 98–111)
Creatinine: 0.8 mg/dL (ref 0.44–1.00)
GFR, Estimated: >60 mL/min (ref >60)
Glucose, Bld: 113 mg/dL — ABNORMAL HIGH (ref 70–99)
Potassium: 4.4 mmol/L (ref 3.5–5.1)
Sodium: 139 mmol/L (ref 135–145)
Total Bilirubin: 0.9 mg/dL (ref 0.3–1.2)
Total Protein: 6.9 g/dL (ref 6.5–8.1)

## 2020-05-24 LAB — CBC WITH DIFFERENTIAL (CANCER CENTER ONLY)
Abs Immature Granulocytes: 0.01 10*3/uL (ref 0.00–0.07)
Basophils Absolute: 0.1 10*3/uL (ref 0.0–0.1)
Basophils Relative: 2 %
Eosinophils Absolute: 0.2 10*3/uL (ref 0.0–0.5)
Eosinophils Relative: 4 %
HCT: 42.6 % (ref 36.0–46.0)
Hemoglobin: 13 g/dL (ref 12.0–15.0)
Immature Granulocytes: 0 %
Lymphocytes Relative: 36 %
Lymphs Abs: 1.2 10*3/uL (ref 0.7–4.0)
MCH: 24.1 pg — ABNORMAL LOW (ref 26.0–34.0)
MCHC: 30.5 g/dL (ref 30.0–36.0)
MCV: 79 fL — ABNORMAL LOW (ref 80.0–100.0)
Monocytes Absolute: 0.4 10*3/uL (ref 0.1–1.0)
Monocytes Relative: 13 %
Neutro Abs: 1.5 10*3/uL — ABNORMAL LOW (ref 1.7–7.7)
Neutrophils Relative %: 45 %
Platelet Count: 276 10*3/uL (ref 150–400)
RBC: 5.39 MIL/uL — ABNORMAL HIGH (ref 3.87–5.11)
RDW: 15.9 % — ABNORMAL HIGH (ref 11.5–15.5)
WBC Count: 3.4 10*3/uL — ABNORMAL LOW (ref 4.0–10.5)
nRBC: 0 % (ref 0.0–0.2)

## 2020-05-24 LAB — FERRITIN: Ferritin: 10 ng/mL — ABNORMAL LOW (ref 11–307)

## 2020-05-24 LAB — IRON AND TIBC
Iron: 46 ug/dL (ref 41–142)
Saturation Ratios: 11 % — ABNORMAL LOW (ref 21–57)
TIBC: 434 ug/dL (ref 236–444)
UIBC: 387 ug/dL — ABNORMAL HIGH (ref 120–384)

## 2020-05-24 NOTE — Progress Notes (Addendum)
Hematology and Oncology Follow Up Visit  Heather Brown [id number] October 19, 1945 74 y.o. 05/24/2020   Principle Diagnosis:  Polycythemia vera -- DNMT3A (+), Epo = 5.7  Current Therapy: Phlebotomy to maintain hematocrit below 45% Aspirin 81 mg PO daily   Interim History:  Heather Brown is here today for follow-up. She is doing well and has no complaints at this time.  She has not noted any fever, chills, n/v, cough, rash, SOB, chest pain, palpitations, abdominal pain or changes in bowel or bladder habits.  No blood loss. No bruising or petechiae.  Her surgery on 04/21/2020 for removal of ruptured implant and placement of new implants went well.  No swelling, tenderness, numbness or tingling in her extremities at this time.  No falls or syncope.  She has maintained a good appetite and does have spinach in her smoothie every morning. She is staying well hydrated and her weight is stable at 162 lbs.   ECOG Performance Status: 0 - Asymptomatic  Medications:  Allergies as of 05/24/2020      Reactions   Sulfa Antibiotics Hives   Cephalexin Rash   Face turned bright red.      Medication List       Accurate as of May 24, 2020 10:29 AM. If you have any questions, ask your nurse or doctor.        aspirin 81 MG tablet Take 81 mg by mouth daily.   Biotin 2500 MCG Caps Take by mouth. Three times weekly   CALCIUM CARBONATE ANTACID PO Take 1,000 mg by mouth daily as needed for indigestion or heartburn.   fish oil-omega-3 fatty acids 1000 MG capsule Take 1 g by mouth daily. With 360 mg omega-3   glucosamine-chondroitin 500-400 MG tablet Take 1 tablet by mouth daily.   ibuprofen 600 MG tablet Commonly known as: ADVIL Take 1 tablet (600 mg total) by mouth every 6 (six) hours as needed for mild pain or moderate pain. For use AFTER surgery   levothyroxine 75 MCG tablet Commonly known as: SYNTHROID 75 mcg daily.   loratadine 10 MG tablet Commonly known as:  CLARITIN Take 10 mg by mouth daily as needed for allergies.   multivitamin capsule Take 1 capsule by mouth daily.   tretinoin 0.05 % cream Commonly known as: RETIN-A APPLY PEA SIZED AMOUNT TO AFFECTED AREA OF FACE NIGHTLY   triamcinolone ointment 0.5 % Commonly known as: KENALOG Apply 1 application topically 2 (two) times daily.   Vitamin D3 25 MCG (1000 UT) Caps Take 1,000 Units by mouth daily.       Allergies:  Allergies  Allergen Reactions  . Sulfa Antibiotics Hives  . Cephalexin Rash    Face turned bright red.    Past Medical History, Surgical history, Social history, and Family History were reviewed and updated.  Review of Systems: All other 10 point review of systems is negative.   Physical Exam:  weight is 162 lb 1.3 oz (73.5 kg). Her oral temperature is 98 F (36.7 C). Her blood pressure is 132/68 and her pulse is 74. Her respiration is 18 and oxygen saturation is 100%.   Wt Readings from Last 3 Encounters:  05/24/20 162 lb 1.3 oz (73.5 kg)  05/17/20 165 lb (74.8 kg)  03/29/20 161 lb (73 kg)    Ocular: Sclerae unicteric, pupils equal, round and reactive to light Ear-nose-throat: Oropharynx clear, dentition fair Lymphatic: No cervical or supraclavicular adenopathy Lungs no rales or rhonchi, good excursion bilaterally Heart regular rate and rhythm,  no murmur appreciated Abd soft, nontender, positive bowel sounds, no liver or spleen tip palpated on exam, no fluid wave MSK no focal spinal tenderness, no joint edema Neuro: non-focal, well-oriented, appropriate affect Breasts: Deferred   Lab Results  Component Value Date   WBC 3.4 (L) 05/24/2020   HGB 13.0 05/24/2020   HCT 42.6 05/24/2020   MCV 79.0 (L) 05/24/2020   PLT 276 05/24/2020   Lab Results  Component Value Date   FERRITIN 8 (L) 03/23/2020   IRON 48 03/23/2020   TIBC 450 (H) 03/23/2020   UIBC 402 (H) 03/23/2020   IRONPCTSAT 11 (L) 03/23/2020   Lab Results  Component Value Date    RETICCTPCT 0.9 10/01/2017   RBC 5.39 (H) 05/24/2020   No results found for: KPAFRELGTCHN, LAMBDASER, KAPLAMBRATIO No results found for: IGGSERUM, IGA, IGMSERUM No results found for: Odetta Pink, SPEI   Chemistry      Component Value Date/Time   NA 139 05/24/2020 0932   NA 140 08/19/2017 1517   K 4.4 05/24/2020 0932   CL 106 05/24/2020 0932   CO2 28 05/24/2020 0932   BUN 16 05/24/2020 0932   BUN 15 08/19/2017 1517   CREATININE 0.80 05/24/2020 0932   CREATININE 0.79 03/10/2014 0836      Component Value Date/Time   CALCIUM 10.7 (H) 05/24/2020 0932   ALKPHOS 120 05/24/2020 0932   AST 19 05/24/2020 0932   ALT 20 05/24/2020 0932   BILITOT 0.9 05/24/2020 0932       Impression and Plan: Heather Brown is a very pleasant 74 yo caucasian female with polycythemia vera.  No phlebotomy needed for Hct 42.6%.  Follow-up in 3 months.  She was encouraged to contact our office with any questions or concerns.   Laverna Peace, NP 10/26/202110:29 AM

## 2020-05-24 NOTE — Telephone Encounter (Signed)
Appointments scheduled calendar printed & mailed per 10/26 los

## 2020-06-29 DIAGNOSIS — H00025 Hordeolum internum left lower eyelid: Secondary | ICD-10-CM | POA: Diagnosis not present

## 2020-06-29 DIAGNOSIS — H40013 Open angle with borderline findings, low risk, bilateral: Secondary | ICD-10-CM | POA: Diagnosis not present

## 2020-08-23 ENCOUNTER — Encounter: Payer: Self-pay | Admitting: Plastic Surgery

## 2020-08-23 ENCOUNTER — Other Ambulatory Visit: Payer: Self-pay

## 2020-08-23 ENCOUNTER — Ambulatory Visit (INDEPENDENT_AMBULATORY_CARE_PROVIDER_SITE_OTHER): Payer: Medicare Other | Admitting: Plastic Surgery

## 2020-08-23 VITALS — BP 131/81 | HR 95

## 2020-08-23 DIAGNOSIS — T8543XD Leakage of breast prosthesis and implant, subsequent encounter: Secondary | ICD-10-CM

## 2020-08-23 NOTE — Progress Notes (Signed)
The patient is a 75 year old female here for follow-up after undergoing removal and replacement of her breast implants.  The patient's incisions have healed very nicely.  There is no sign of infection.  No sign of hematoma or seroma.  Patient is very pleased and will follow up as needed.  I recommend continuing with the sports bra or a like Cami.  Patient is good to go to [employer] to see if she can find something that is comfortable for her.  We are here if she needs anything.

## 2020-08-24 ENCOUNTER — Encounter: Payer: Self-pay | Admitting: Hematology & Oncology

## 2020-08-24 ENCOUNTER — Inpatient Hospital Stay: Payer: Medicare Other | Attending: Hematology & Oncology

## 2020-08-24 ENCOUNTER — Inpatient Hospital Stay (HOSPITAL_BASED_OUTPATIENT_CLINIC_OR_DEPARTMENT_OTHER): Payer: Medicare Other | Admitting: Hematology & Oncology

## 2020-08-24 ENCOUNTER — Inpatient Hospital Stay: Payer: Medicare Other

## 2020-08-24 ENCOUNTER — Other Ambulatory Visit: Payer: Self-pay

## 2020-08-24 ENCOUNTER — Telehealth: Payer: Self-pay

## 2020-08-24 VITALS — BP 134/99 | HR 80 | Temp 98.9°F | Resp 20 | Wt 163.4 lb

## 2020-08-24 DIAGNOSIS — D45 Polycythemia vera: Secondary | ICD-10-CM | POA: Diagnosis not present

## 2020-08-24 DIAGNOSIS — D500 Iron deficiency anemia secondary to blood loss (chronic): Secondary | ICD-10-CM

## 2020-08-24 DIAGNOSIS — Z7982 Long term (current) use of aspirin: Secondary | ICD-10-CM | POA: Diagnosis not present

## 2020-08-24 LAB — FERRITIN: Ferritin: 8 ng/mL — ABNORMAL LOW (ref 11–307)

## 2020-08-24 LAB — CBC WITH DIFFERENTIAL (CANCER CENTER ONLY)
Abs Immature Granulocytes: 0.01 10*3/uL (ref 0.00–0.07)
Basophils Absolute: 0.1 10*3/uL (ref 0.0–0.1)
Basophils Relative: 2 %
Eosinophils Absolute: 0.2 10*3/uL (ref 0.0–0.5)
Eosinophils Relative: 4 %
HCT: 42.6 % (ref 36.0–46.0)
Hemoglobin: 12.9 g/dL (ref 12.0–15.0)
Immature Granulocytes: 0 %
Lymphocytes Relative: 36 %
Lymphs Abs: 1.3 10*3/uL (ref 0.7–4.0)
MCH: 23 pg — ABNORMAL LOW (ref 26.0–34.0)
MCHC: 30.3 g/dL (ref 30.0–36.0)
MCV: 75.9 fL — ABNORMAL LOW (ref 80.0–100.0)
Monocytes Absolute: 0.5 10*3/uL (ref 0.1–1.0)
Monocytes Relative: 13 %
Neutro Abs: 1.7 10*3/uL (ref 1.7–7.7)
Neutrophils Relative %: 45 %
Platelet Count: 327 10*3/uL (ref 150–400)
RBC: 5.61 MIL/uL — ABNORMAL HIGH (ref 3.87–5.11)
RDW: 16.8 % — ABNORMAL HIGH (ref 11.5–15.5)
WBC Count: 3.7 10*3/uL — ABNORMAL LOW (ref 4.0–10.5)
nRBC: 0 % (ref 0.0–0.2)

## 2020-08-24 LAB — CMP (CANCER CENTER ONLY)
ALT: 25 U/L (ref 0–44)
AST: 23 U/L (ref 15–41)
Albumin: 4 g/dL (ref 3.5–5.0)
Alkaline Phosphatase: 120 U/L (ref 38–126)
Anion gap: 7 (ref 5–15)
BUN: 18 mg/dL (ref 8–23)
CO2: 28 mmol/L (ref 22–32)
Calcium: 11.1 mg/dL — ABNORMAL HIGH (ref 8.9–10.3)
Chloride: 107 mmol/L (ref 98–111)
Creatinine: 0.78 mg/dL (ref 0.44–1.00)
GFR, Estimated: >60 mL/min (ref >60)
Glucose, Bld: 102 mg/dL — ABNORMAL HIGH (ref 70–99)
Potassium: 4.3 mmol/L (ref 3.5–5.1)
Sodium: 142 mmol/L (ref 135–145)
Total Bilirubin: 0.9 mg/dL (ref 0.3–1.2)
Total Protein: 6.7 g/dL (ref 6.5–8.1)

## 2020-08-24 LAB — IRON AND TIBC
Iron: 48 ug/dL (ref 41–142)
Saturation Ratios: 11 % — ABNORMAL LOW (ref 21–57)
TIBC: 445 ug/dL — ABNORMAL HIGH (ref 236–444)
UIBC: 397 ug/dL — ABNORMAL HIGH (ref 120–384)

## 2020-08-24 NOTE — Progress Notes (Signed)
Hematology and Oncology Follow Up Visit  Heather Brown [id number] 12/08/1945 75 y.o. 08/24/2020   Principle Diagnosis:   Polycythemia vera -- DNMT3A (+),  Epo = 5.7  Current Therapy:    Phlebotomy to maintain hematocrit below 45%  Aspirin 81 mg p.o. daily     Interim History:  Heather Brown is back for follow-up.  As always, she is doing quite well.  She is quite busy.  She does feel tired.  Her husband has myasthenia gravis and she is trying to help him as much as possible.  We have not had to phlebotomize her for quite a while.  We last saw her back in October, her ferritin was 10 with an iron saturation of 11%.  I am sure this is part of why she probably does feel tired.  She has had no problems with the coronavirus.  She has had no cough or shortness of breath.  Is been no change in bowel or bladder habits.  There is been no leg swelling.  Overall, her performance status is ECOG 0.     Medications:  Current Outpatient Medications:  .  aspirin 81 MG tablet, Take 81 mg by mouth daily., Disp: , Rfl:  .  Biotin 2500 MCG CAPS, Take 1 tablet by mouth daily., Disp: , Rfl:  .  CALCIUM CARBONATE ANTACID PO, Take 1,000 mg by mouth daily as needed for indigestion or heartburn. , Disp: , Rfl:  .  Cholecalciferol (VITAMIN D3) 1000 UNITS CAPS, Take 1,000 Units by mouth daily., Disp: , Rfl:  .  fish oil-omega-3 fatty acids 1000 MG capsule, Take 1 g by mouth daily. With 360 mg omega-3, Disp: , Rfl:  .  glucosamine-chondroitin 500-400 MG tablet, Take 1 tablet by mouth daily., Disp: , Rfl:  .  levothyroxine (SYNTHROID) 75 MCG tablet, 75 mcg daily., Disp: , Rfl:  .  loratadine (CLARITIN) 10 MG tablet, Take 10 mg by mouth daily as needed for allergies. , Disp: , Rfl:  .  Multiple Vitamin (MULTIVITAMIN) capsule, Take 1 capsule by mouth daily., Disp: , Rfl:  .  ibuprofen (ADVIL) 600 MG tablet, Take 1 tablet (600 mg total) by mouth every 6 (six) hours as needed for mild pain or moderate pain.  For use AFTER surgery (Patient not taking: Reported on 08/24/2020), Disp: 30 tablet, Rfl: 0 .  tretinoin (RETIN-A) 0.05 % cream, Apply pea sized amount to affected area of face nightly as needed. (Patient not taking: Reported on 08/24/2020), Disp: , Rfl: 5 .  triamcinolone ointment (KENALOG) 0.5 %, Apply 1 application topically 2 (two) times daily. (Patient not taking: Reported on 08/24/2020), Disp: 30 g, Rfl: 2  Allergies:  Allergies  Allergen Reactions  . Sulfa Antibiotics Hives  . Cephalexin Rash    Face turned bright red.    Past Medical History, Surgical history, Social history, and Family History were reviewed and updated.  Review of Systems: Review of Systems  HENT:  Negative.   Eyes: Negative.   Respiratory: Positive for shortness of breath.   Cardiovascular: Negative.   Gastrointestinal: Positive for abdominal distention.  Endocrine: Negative.   Musculoskeletal: Positive for arthralgias, flank pain, myalgias and neck stiffness.  Skin: Negative.   Neurological: Negative.   Hematological: Negative.   Psychiatric/Behavioral: Negative.     Physical Exam:  weight is 163 lb 6.4 oz (74.1 kg). Her oral temperature is 98.9 F (37.2 C). Her blood pressure is 134/99 (abnormal) and her pulse is 80. Her respiration is 20 and oxygen  saturation is 96%.   Wt Readings from Last 3 Encounters:  08/24/20 163 lb 6.4 oz (74.1 kg)  05/24/20 162 lb 1.3 oz (73.5 kg)  05/17/20 165 lb (74.8 kg)    Physical Exam Vitals reviewed.  HENT:     Head: Normocephalic and atraumatic.  Eyes:     Pupils: Pupils are equal, round, and reactive to light.  Cardiovascular:     Rate and Rhythm: Normal rate and regular rhythm.     Heart sounds: Normal heart sounds.  Pulmonary:     Effort: Pulmonary effort is normal.     Breath sounds: Normal breath sounds.  Abdominal:     General: Bowel sounds are normal.     Palpations: Abdomen is soft.  Musculoskeletal:        General: No tenderness or deformity.  Normal range of motion.     Cervical back: Normal range of motion.     Comments: Her big toe on her right foot is a little swollen.  There is some erythema associated with this.  She does have decent range of motion.  There is no tenderness to palpation over the area of erythema.  Lymphadenopathy:     Cervical: No cervical adenopathy.  Skin:    General: Skin is warm and dry.     Findings: No erythema or rash.  Neurological:     Mental Status: She is alert and oriented to person, place, and time.  Psychiatric:        Behavior: Behavior normal.        Thought Content: Thought content normal.        Judgment: Judgment normal.      Lab Results  Component Value Date   WBC 3.7 (L) 08/24/2020   HGB 12.9 08/24/2020   HCT 42.6 08/24/2020   MCV 75.9 (L) 08/24/2020   PLT 327 08/24/2020     Chemistry      Component Value Date/Time   NA 142 08/24/2020 0954   NA 140 08/19/2017 1517   K 4.3 08/24/2020 0954   CL 107 08/24/2020 0954   CO2 28 08/24/2020 0954   BUN 18 08/24/2020 0954   BUN 15 08/19/2017 1517   CREATININE 0.78 08/24/2020 0954   CREATININE 0.79 03/10/2014 0836      Component Value Date/Time   CALCIUM 11.1 (H) 08/24/2020 0954   ALKPHOS 120 08/24/2020 0954   AST 23 08/24/2020 0954   ALT 25 08/24/2020 0954   BILITOT 0.9 08/24/2020 0954       Impression and Plan: Heather Brown is a 75 year old white female.  She has polycythemia vera.  This is actually a variant of polycythemia.  We will not phlebotomize her today.  I think because her blood is doing so well, we probably can get her back in 6 months.  We will have to watch her iron studies.  Again, I am sure low iron is probably why she does feel a little bit tired.    [name], MD 1/26/202211:05 AM

## 2020-08-24 NOTE — Telephone Encounter (Signed)
appts made and printed for pt per los   Heather Brown 

## 2020-09-28 DIAGNOSIS — E039 Hypothyroidism, unspecified: Secondary | ICD-10-CM | POA: Diagnosis not present

## 2020-09-28 DIAGNOSIS — E041 Nontoxic single thyroid nodule: Secondary | ICD-10-CM | POA: Diagnosis not present

## 2020-09-29 ENCOUNTER — Other Ambulatory Visit: Payer: Self-pay | Admitting: Endocrinology

## 2020-09-29 DIAGNOSIS — E041 Nontoxic single thyroid nodule: Secondary | ICD-10-CM

## 2020-10-10 ENCOUNTER — Ambulatory Visit
Admission: RE | Admit: 2020-10-10 | Discharge: 2020-10-10 | Disposition: A | Discharge status: Home / Self Care | Payer: Medicare Other | Source: Ambulatory Visit | Attending: Endocrinology | Admitting: Endocrinology

## 2020-10-10 DIAGNOSIS — E041 Nontoxic single thyroid nodule: Secondary | ICD-10-CM

## 2020-10-28 HISTORY — PX: BREAST IMPLANT EXCHANGE: SHX6296

## 2020-12-08 ENCOUNTER — Telehealth: Payer: Self-pay | Admitting: *Deleted

## 2020-12-08 ENCOUNTER — Telehealth: Payer: Self-pay

## 2020-12-08 ENCOUNTER — Ambulatory Visit: Payer: Medicare Other

## 2020-12-08 NOTE — Telephone Encounter (Signed)
Message received from scheduling, "Pt called stating that she fell yesterday and needs to take something for pain today and could not remember if it was the advil or tylenol that she shouldn't take with her blood disease.  Pt req a nurse call."  Call placed to patient and patient instructed that she should not take Tylenol d/t her history of fatty liver per notes from her PCP.  Pt is appreciative of call and states that she is having some leg pain and would like to know if she can come in for lab work.  Dr. Marin Olp notified and order received for pt to come in for lab work.  Message sent to scheduling.

## 2020-12-08 NOTE — Telephone Encounter (Signed)
Called and left a vm to call for a lab only appt per 5/12 sch message  Maribelle Hopple

## 2020-12-08 NOTE — Telephone Encounter (Signed)
Pt called stating that she fell yesterday and needs to take something for pain today and could not remember if it was the advil or tylenol that she shouldn't take with her blood disease.  Pt req a nurse call  Webb Silversmith

## 2020-12-12 ENCOUNTER — Ambulatory Visit (HOSPITAL_BASED_OUTPATIENT_CLINIC_OR_DEPARTMENT_OTHER): Payer: Medicare Other | Admitting: Obstetrics & Gynecology

## 2020-12-13 ENCOUNTER — Inpatient Hospital Stay: Payer: Medicare Other | Attending: Hematology & Oncology

## 2020-12-13 ENCOUNTER — Other Ambulatory Visit: Payer: Self-pay

## 2020-12-13 ENCOUNTER — Telehealth: Payer: Self-pay | Admitting: *Deleted

## 2020-12-13 ENCOUNTER — Other Ambulatory Visit: Payer: Self-pay | Admitting: *Deleted

## 2020-12-13 DIAGNOSIS — D45 Polycythemia vera: Secondary | ICD-10-CM

## 2020-12-13 DIAGNOSIS — D751 Secondary polycythemia: Secondary | ICD-10-CM | POA: Diagnosis not present

## 2020-12-13 LAB — CBC WITH DIFFERENTIAL (CANCER CENTER ONLY)
Abs Immature Granulocytes: 0.01 10*3/uL (ref 0.00–0.07)
Basophils Absolute: 0 10*3/uL (ref 0.0–0.1)
Basophils Relative: 1 %
Eosinophils Absolute: 0.1 10*3/uL (ref 0.0–0.5)
Eosinophils Relative: 4 %
HCT: 41.8 % (ref 36.0–46.0)
Hemoglobin: 12.8 g/dL (ref 12.0–15.0)
Immature Granulocytes: 0 %
Lymphocytes Relative: 37 %
Lymphs Abs: 1.5 10*3/uL (ref 0.7–4.0)
MCH: 23 pg — ABNORMAL LOW (ref 26.0–34.0)
MCHC: 30.6 g/dL (ref 30.0–36.0)
MCV: 75.2 fL — ABNORMAL LOW (ref 80.0–100.0)
Monocytes Absolute: 0.6 10*3/uL (ref 0.1–1.0)
Monocytes Relative: 14 %
Neutro Abs: 1.8 10*3/uL (ref 1.7–7.7)
Neutrophils Relative %: 44 %
Platelet Count: 290 10*3/uL (ref 150–400)
RBC: 5.56 MIL/uL — ABNORMAL HIGH (ref 3.87–5.11)
RDW: 16.5 % — ABNORMAL HIGH (ref 11.5–15.5)
WBC Count: 4 10*3/uL (ref 4.0–10.5)
nRBC: 0 % (ref 0.0–0.2)

## 2020-12-13 LAB — COMPREHENSIVE METABOLIC PANEL
ALT: 25 U/L (ref 0–44)
AST: 21 U/L (ref 15–41)
Albumin: 4.2 g/dL (ref 3.5–5.0)
Alkaline Phosphatase: 125 U/L (ref 38–126)
Anion gap: 5 (ref 5–15)
BUN: 18 mg/dL (ref 8–23)
CO2: 28 mmol/L (ref 22–32)
Calcium: 10.8 mg/dL — ABNORMAL HIGH (ref 8.9–10.3)
Chloride: 106 mmol/L (ref 98–111)
Creatinine, Ser: 0.74 mg/dL (ref 0.44–1.00)
GFR, Estimated: >60 mL/min (ref >60)
Glucose, Bld: 97 mg/dL (ref 70–99)
Potassium: 4.6 mmol/L (ref 3.5–5.1)
Sodium: 139 mmol/L (ref 135–145)
Total Bilirubin: 0.9 mg/dL (ref 0.3–1.2)
Total Protein: 6.5 g/dL (ref 6.5–8.1)

## 2020-12-13 NOTE — Telephone Encounter (Signed)
Patient called inquiring about her labs.  Hgb 12.8 and Hct 41.8.  No need for phlebotomy per dr Marin Olp

## 2021-01-11 DIAGNOSIS — D225 Melanocytic nevi of trunk: Secondary | ICD-10-CM | POA: Diagnosis not present

## 2021-01-11 DIAGNOSIS — L820 Inflamed seborrheic keratosis: Secondary | ICD-10-CM | POA: Diagnosis not present

## 2021-01-11 DIAGNOSIS — L814 Other melanin hyperpigmentation: Secondary | ICD-10-CM | POA: Diagnosis not present

## 2021-01-11 DIAGNOSIS — L821 Other seborrheic keratosis: Secondary | ICD-10-CM | POA: Diagnosis not present

## 2021-01-24 NOTE — Progress Notes (Signed)
75 y.o. G19P2002 Married White or Caucasian female here for breast and pelvic exam.  I am also following her for osteopenia.  Denies vaginal bleeding.  Reports she's been having some vertigo.  Has fallen a time or two.  Hasn't tried anything.  Hasn't broken anything.  Discussed antivert and PT with her.  Husband is needing more care.  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  She does all the driving.  Did have implants removed and replaced.    H/o polycythemia vera.  Had appt 5/202 with oncology.  CBC and CMP reviewed.  Patient's last menstrual period was 07/30/1980.          Sexually active: No. Not since her husband's last surgery H/O STD:  no  Health Maintenance: PCP:  Dr. Drema Dallas.  Needs new provider.  Discussed options.   Vaccines are up to date:  needs pneumonia vaccination Colonoscopy:  10/01/2017, follow up 5 years.  Dr. Watt Climes. MMG:  01/2020 BMD:  03/2020 Last pap smear:  2019 negative.   H/o abnormal pap smear:  no   reports that she has never smoked. She has never used smokeless tobacco. She reports previous alcohol use. She reports that she does not use drugs.  Past Medical History:  Diagnosis Date   Dyspareunia    dryness with intercourse   Endometriosis 1982   found at [facility]   GERD (gastroesophageal reflux disease)    Heart murmur    History of left shoulder fracture 2017   Hypothyroidism    Left ACL tear    Osteopenia 2009   Pneumonia    Polycythemia vera ([place]) 12/02/2017   PONV (postoperative nausea and vomiting)    Thyroid nodule    followed by Dr Chalmers Cater   Urinary incontinence    with coughing/sneezing    Past Surgical History:  Procedure Laterality Date   ABDOMINAL HYSTERECTOMY  1982   [facility]--for abnormal paps/endometriosis   AUGMENTATION MAMMAPLASTY  1977   COLONOSCOPY WITH PROPOFOL N/A 10/29/2013   Procedure: COLONOSCOPY WITH PROPOFOL;  Surgeon: Jeryl Columbia, MD;  Location: [facility] ENDOSCOPY;  Service: Endoscopy;  Laterality: N/A;   COLONOSCOPY WITH PROPOFOL N/A 12/22/2014    Procedure: COLONOSCOPY WITH PROPOFOL;  Surgeon: Clarene Essex, MD;  Location: [facility] ENDOSCOPY;  Service: Endoscopy;  Laterality: N/A;  ultra slim scope   COSMETIC SURGERY  11/2011   face lift,neck lift, eye lift--Dr. Stephanie Coup   dilatation and curettage  1982   HOT HEMOSTASIS N/A 12/22/2014   Procedure: HOT HEMOSTASIS (ARGON PLASMA COAGULATION/BICAP);  Surgeon: Clarene Essex, MD;  Location: [facility] ENDOSCOPY;  Service: Endoscopy;  Laterality: N/A;   TONSILLECTOMY      Current Outpatient Medications  Medication Sig Dispense Refill   aspirin 81 MG tablet Take 81 mg by mouth daily.     Biotin 2500 MCG CAPS Take 1 tablet by mouth daily.     CALCIUM CARBONATE ANTACID PO Take 1,000 mg by mouth daily as needed for indigestion or heartburn.      Cholecalciferol (VITAMIN D3) 1000 UNITS CAPS Take 1,000 Units by mouth daily.     ibuprofen (ADVIL) 600 MG tablet Take 1 tablet (600 mg total) by mouth every 6 (six) hours as needed for mild pain or moderate pain. For use AFTER surgery 30 tablet 0   levothyroxine (SYNTHROID) 75 MCG tablet 75 mcg daily.     loratadine (CLARITIN) 10 MG tablet Take 10 mg by mouth daily as needed for allergies.      Multiple Vitamin (MULTIVITAMIN) capsule Take 1 capsule  by mouth daily.     Nutritional Supplements (JOINT FORMULA PO) Take 1 capsule by mouth 1 day or 1 dose.     tretinoin (RETIN-A) 0.05 % cream Apply pea sized amount to affected area of face nightly as needed.  5   triamcinolone ointment (KENALOG) 0.5 % Apply 1 application topically 2 (two) times daily. 30 g 2   fish oil-omega-3 fatty acids 1000 MG capsule Take 1 g by mouth daily. With 360 mg omega-3 (Patient not taking: Reported on 01/25/2021)     glucosamine-chondroitin 500-400 MG tablet Take 1 tablet by mouth daily. (Patient not taking: Reported on 01/25/2021)     No current facility-administered medications for this visit.    Family History  Problem Relation Age of Onset   Cancer Mother    Cervical cancer Mother    Diabetes  Father        adult onset   Stroke Father    Thyroid disease Sister        hyperthyroid   Asthma Sister    Thyroid disease Sister        hypothyroid   Cancer Maternal Grandmother    Stomach cancer Maternal Grandmother     Review of Systems  Constitutional: Negative.   Genitourinary: Negative.   Neurological:  Positive for dizziness.   Exam:   BP 126/87 (BP Location: Right Arm, Patient Position: Sitting, Cuff Size: Large)   Pulse 75   Ht 5\' 1"  (1.549 m)   Wt 164 lb 9.6 oz (74.7 kg)   LMP 07/30/1980   BMI 31.10 kg/m   Height: 5\' 1"  (154.9 cm)  General appearance: alert, cooperative and appears stated age Breasts: normal appearance, no masses or tenderness Abdomen: soft, non-tender; bowel sounds normal; no masses,  no organomegaly Lymph nodes: Cervical, supraclavicular, and axillary nodes normal.  No abnormal inguinal nodes palpated Neurologic: Grossly normal  Pelvic: External genitalia:  no lesions              Urethra:  normal appearing urethra with no masses, tenderness or lesions              Bartholins and Skenes: normal                 Vagina: normal appearing vagina with atrophic changes and no discharge, no lesions              Cervix: absent              Pap taken: No. Bimanual Exam:  Uterus:  uterus absent              Adnexa: normal adnexa               Rectovaginal: Confirms               Anus:  normal sphincter tone, no lesions  Chaperone, Octaviano Batty, CMA, was present for exam.  Assessment/Plan: 1. Encounter for gynecological examination without abnormal finding - pap smear not indicated - MMG 01/2020.  Pt does not want one this year due to recent implant exchange - colonoscopy 2019.  Follow up 5 years. - BMD 2021.  Repeat next year. - discussed new PCPs - pt needs pneumonia vaccinations  2. Vertigo - Antivert discussed - Ambulatory referral to Physical Therapy  3. Postmenopausal - off HRT  4. H/O abdominal hysterectomy  5. Vitamin D  deficiency  6. Ruptured silicone breast implant, subsequent encounter - had implant exchange with Dr. Marla Roe  7. Age-related osteoporosis  without current pathological fracture - repeat BMD next year

## 2021-01-25 ENCOUNTER — Other Ambulatory Visit: Payer: Self-pay

## 2021-01-25 ENCOUNTER — Ambulatory Visit (INDEPENDENT_AMBULATORY_CARE_PROVIDER_SITE_OTHER): Payer: Medicare Other | Admitting: Obstetrics & Gynecology

## 2021-01-25 ENCOUNTER — Encounter (HOSPITAL_BASED_OUTPATIENT_CLINIC_OR_DEPARTMENT_OTHER): Payer: Self-pay | Admitting: Obstetrics & Gynecology

## 2021-01-25 VITALS — BP 126/87 | HR 75 | Ht 61.0 in | Wt 164.6 lb

## 2021-01-25 DIAGNOSIS — E559 Vitamin D deficiency, unspecified: Secondary | ICD-10-CM | POA: Diagnosis not present

## 2021-01-25 DIAGNOSIS — Z01419 Encounter for gynecological examination (general) (routine) without abnormal findings: Secondary | ICD-10-CM

## 2021-01-25 DIAGNOSIS — R42 Dizziness and giddiness: Secondary | ICD-10-CM | POA: Diagnosis not present

## 2021-01-25 DIAGNOSIS — E041 Nontoxic single thyroid nodule: Secondary | ICD-10-CM | POA: Insufficient documentation

## 2021-01-25 DIAGNOSIS — Z780 Asymptomatic menopausal state: Secondary | ICD-10-CM | POA: Diagnosis not present

## 2021-01-25 DIAGNOSIS — M810 Age-related osteoporosis without current pathological fracture: Secondary | ICD-10-CM

## 2021-01-25 DIAGNOSIS — T8543XD Leakage of breast prosthesis and implant, subsequent encounter: Secondary | ICD-10-CM

## 2021-01-25 DIAGNOSIS — Z90710 Acquired absence of both cervix and uterus: Secondary | ICD-10-CM | POA: Diagnosis not present

## 2021-01-25 DIAGNOSIS — T8543XA Leakage of breast prosthesis and implant, initial encounter: Secondary | ICD-10-CM

## 2021-01-25 NOTE — Patient Instructions (Signed)
Antivert 25mg  take up to four times a day

## 2021-02-08 ENCOUNTER — Ambulatory Visit: Payer: Medicare Other | Attending: Obstetrics & Gynecology | Admitting: Physical Therapy

## 2021-02-08 ENCOUNTER — Other Ambulatory Visit: Payer: Self-pay

## 2021-02-08 ENCOUNTER — Encounter: Payer: Self-pay | Admitting: Physical Therapy

## 2021-02-08 DIAGNOSIS — H8113 Benign paroxysmal vertigo, bilateral: Secondary | ICD-10-CM | POA: Diagnosis not present

## 2021-02-09 NOTE — Therapy (Signed)
[facility] [address], [place], [place], [address] Phone: [phone]   Fax:  [phone]  Physical Therapy Evaluation  Patient Details  Name: Heather Brown MRN: [id number] Date of Birth: Nov 02, 1945 Referring Provider (PT): Earlie Raveling, NP   Encounter Date: 02/08/2021   PT End of Session - 02/09/21 0956     Visit Number 1    Authorization Type BCBS Medicare    PT Start Time 1611    PT Stop Time 1700    PT Time Calculation (min) 49 min    Activity Tolerance Patient tolerated treatment well    Behavior During Therapy [facility] for tasks assessed/performed             Past Medical History:  Diagnosis Date   Dyspareunia    dryness with intercourse   Endometriosis 1982   found at [facility]   GERD (gastroesophageal reflux disease)    Heart murmur    History of left shoulder fracture 2017   Hypothyroidism    Left ACL tear    Osteopenia 2009   Pneumonia    Polycythemia vera ([place]) 12/02/2017   PONV (postoperative nausea and vomiting)    Thyroid nodule    followed by Dr Chalmers Cater   Urinary incontinence    with coughing/sneezing    Past Surgical History:  Procedure Laterality Date   ABDOMINAL HYSTERECTOMY  07/30/1980   [facility]--for abnormal paps/endometriosis   AUGMENTATION MAMMAPLASTY  07/31/1975   BREAST IMPLANT EXCHANGE  10/2020   COLONOSCOPY WITH PROPOFOL N/A 10/29/2013   Procedure: COLONOSCOPY WITH PROPOFOL;  Surgeon: Jeryl Columbia, MD;  Location: [facility] ENDOSCOPY;  Service: Endoscopy;  Laterality: N/A;   COLONOSCOPY WITH PROPOFOL N/A 12/22/2014   Procedure: COLONOSCOPY WITH PROPOFOL;  Surgeon: Clarene Essex, MD;  Location: [facility] ENDOSCOPY;  Service: Endoscopy;  Laterality: N/A;  ultra slim scope   COSMETIC SURGERY  11/28/2011   face lift,neck lift, eye lift--Dr. Stephanie Coup   dilatation and curettage  07/30/1980   HOT HEMOSTASIS N/A 12/22/2014   Procedure: HOT HEMOSTASIS (ARGON PLASMA COAGULATION/BICAP);  Surgeon: Clarene Essex, MD;  Location: [facility] ENDOSCOPY;  Service: Endoscopy;  Laterality: N/A;   TONSILLECTOMY      There were no vitals filed for this visit.    Subjective Assessment - 02/08/21 1614     Subjective Patient presenting due to vertigo-like symptoms. States that first episode was approx. 3 weeks ago. Reports that second episode was the following weekend. She states that she stood up from the bed when the room started spinning. She reached for the chest of drawers but missed them and fell. She did not strike her head. Of note, patient fell 12/07/2020 when walking through the yard. She states that she landed on her Rt side chest and face. Had extensive bruising and thought she may have cracked a rib but did not seek out MD evaluation.    Currently in Pain? No/denies                [facility] PT Assessment - 02/09/21 0001       Assessment   Medical Diagnosis R42 (ICD-10-CM) - Vertigo    Referring Provider (PT) Earlie Raveling, NP    Onset Date/Surgical Date --   3 weeks ago   Hand Dominance Right    Next MD Visit None    Prior Therapy Yes      Precautions   Precautions None      Restrictions   Weight Bearing Restrictions No  Home Environment   Living Environment Private residence    Living Arrangements Spouse/significant other   dependent caretaker for husband     Prior Function   Level of Independence Independent    Vocation Retired      Observation/Other Assessments   Other Surveys  Dizziness Handicap Inventory ([place])    Dizziness Handicap Inventory (DHI)  10 (mild impairment)      AROM   Cervical Flexion 53    Cervical Extension 49    Cervical - Right Side Bend 23    Cervical - Left Side Bend 20    Cervical - Right Rotation 59    Cervical - Left Rotation 46      Balance   Balance Assessed Yes      Static Standing Balance   Static Standing Balance -  Activities  Romberg - Eyes Opened;Romberg - Eyes Closed;Romberg - Eyes Opened, Foam   All performed WNL                    Vestibular Assessment - 02/09/21 0001       Symptom Behavior   Type of Dizziness  "World moves"    Frequency of Dizziness twice in the last 4 weeks approx.    Duration of Dizziness 1-3 minutes    Symptom Nature Motion provoked;Positional;Intermittent    Aggravating Factors Supine to sit;Sit to stand    Relieving Factors Closing eyes    Progression of Symptoms No change since onset    History of similar episodes None      Oculomotor Exam   Oculomotor Alignment Normal    Head shaking Horizontal Absent    Head Shaking Vertical Absent    Smooth Pursuits Intact    Saccades Intact      Oculomotor Exam-Fixation Suppressed    Left Head Impulse No abnormalities    Right Head Impulse No abnormalities      Positional Testing   Dix-Hallpike Dix-Hallpike Right;Dix-Hallpike Left    Horizontal Canal Testing Horizontal Canal Right;Horizontal Canal Left      Dix-Hallpike Right   Dix-Hallpike Right Duration No symptom provocation    Dix-Hallpike Right Symptoms No nystagmus      Dix-Hallpike Left   Dix-Hallpike Left Duration No symptom provocation    Dix-Hallpike Left Symptoms No nystagmus      Horizontal Canal Right   Horizontal Canal Right Duration No symptom provocation    Horizontal Canal Right Symptoms Normal      Horizontal Canal Left   Horizontal Canal Left Duration No symptom provocation    Horizontal Canal Left Symptoms Normal      Positional Sensitivities   Sit to Supine No dizziness    Supine to Right Side No dizziness    Supine to Sitting No dizziness    Right Hallpike No dizziness    Up from Right Hallpike No dizziness    Up from Left Hallpike No dizziness      Orthostatics   BP supine (x 5 minutes) 123/76    BP sitting 125/81    BP standing (after 1 minute) 122/76                Objective measurements completed on examination: See above findings.                            Plan - 02/09/21 0949     Clinical  Impression Statement Patient is a 75 y/o female referred due to  vertigo. She has no significant PMH. Upon assessment patient demonstrates no oculomotor deficits. She demonstrates cervical AROM impairments with regards to bilateral side bending. Dix-Hallpike and Roll test negative for left and right side. No orthostatic hyptension noted between supine, sitting, and standing. Patient demonstrates no loss of postural instability with Romberg testing. No apparent skilled needs at this time. Therapist happy to reassess should patient status change.    Personal Factors and Comorbidities Age;Sex;Comorbidity 1    Comorbidities Recent head injury: fall resulting in striking Rt side of face    Stability/Clinical Decision Making Stable/Uncomplicated    Clinical Decision Making Low    Rehab Potential Excellent    PT Frequency Other (comment)   No skilled needs   Consulted and Agree with Plan of Care Patient             Patient will benefit from skilled therapeutic intervention in order to improve the following deficits and impairments:  Dizziness, Difficulty walking, Decreased balance  Visit Diagnosis: BPPV (benign paroxysmal positional vertigo), bilateral     Problem List Patient Active Problem List   Diagnosis Date Noted   Nontoxic single thyroid nodule 01/25/2021   Postmenopausal 01/25/2021   H/O abdominal hysterectomy 01/25/2021   Age-related osteoporosis without current pathological fracture 95/63/8756   Ruptured silicone breast implant 01/11/2020   Polycythemia vera ([place]) 12/02/2017   Hypothyroidism 04/07/2015   Elevated LDL cholesterol level 04/07/2015   Vitamin D deficiency 04/07/2015   Everardo All PT, DPT  02/09/21 9:59 AM   [facility] [address] [place], [place], [address] Phone: [phone]   Fax:  [phone]  Name: Heather Brown MRN: [id number] Date of Birth: [birth year]

## 2021-02-20 IMAGING — MG DIGITAL SCREENING BREAST BILAT IMPLANT W/ TOMO W/ CAD
8 of 12 series · 8 of 28 positions shown · non-contrast
Comparison: Previous exam(s).

CLINICAL DATA: Screening.

EXAM:
DIGITAL SCREENING BILATERAL MAMMOGRAM WITH IMPLANTS, CAD AND TOMO

[L MLO]
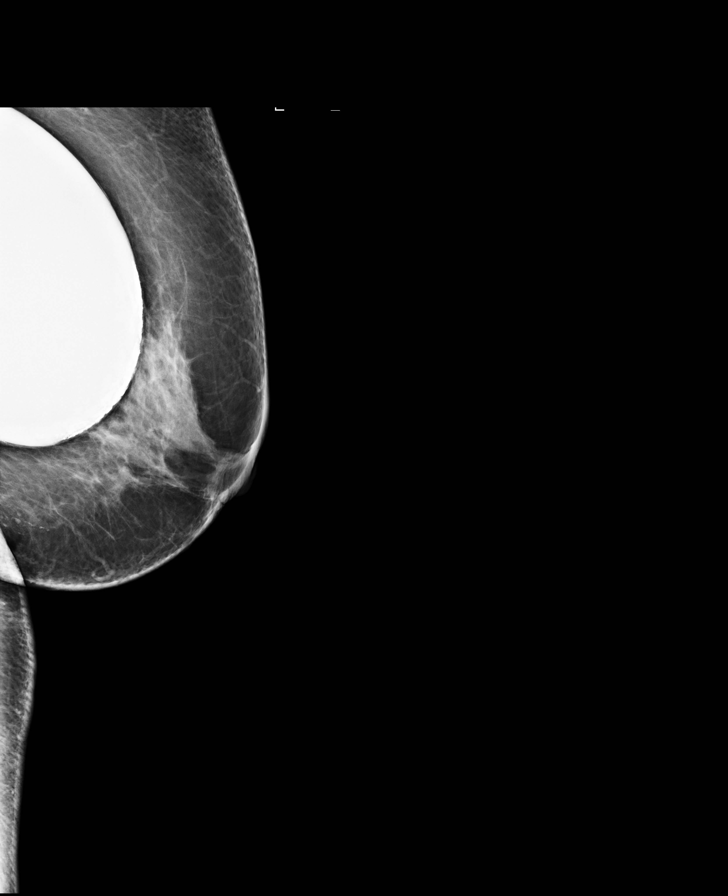

[R MLO]
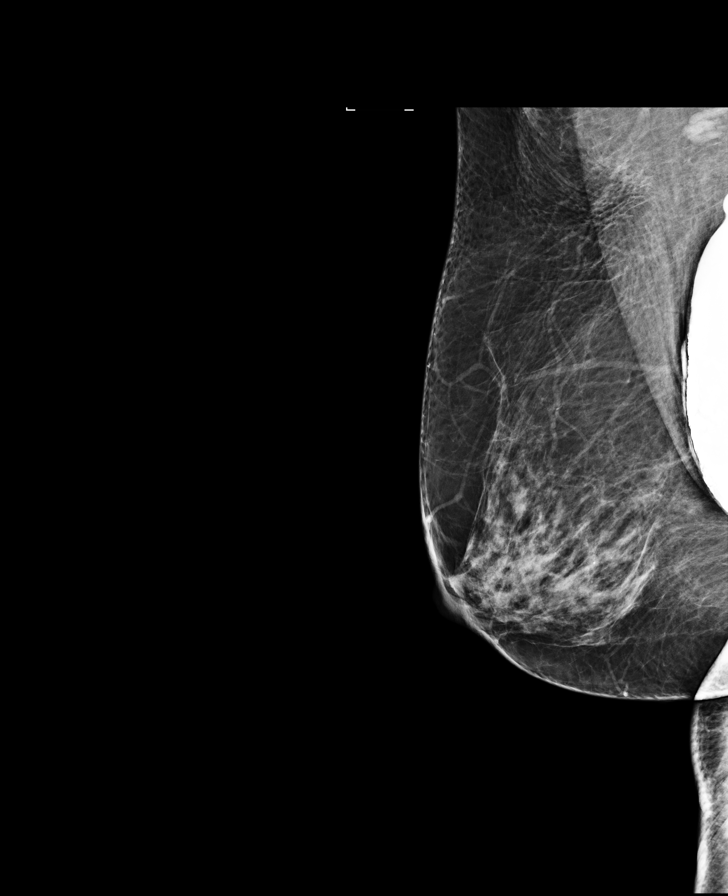

[R CC]
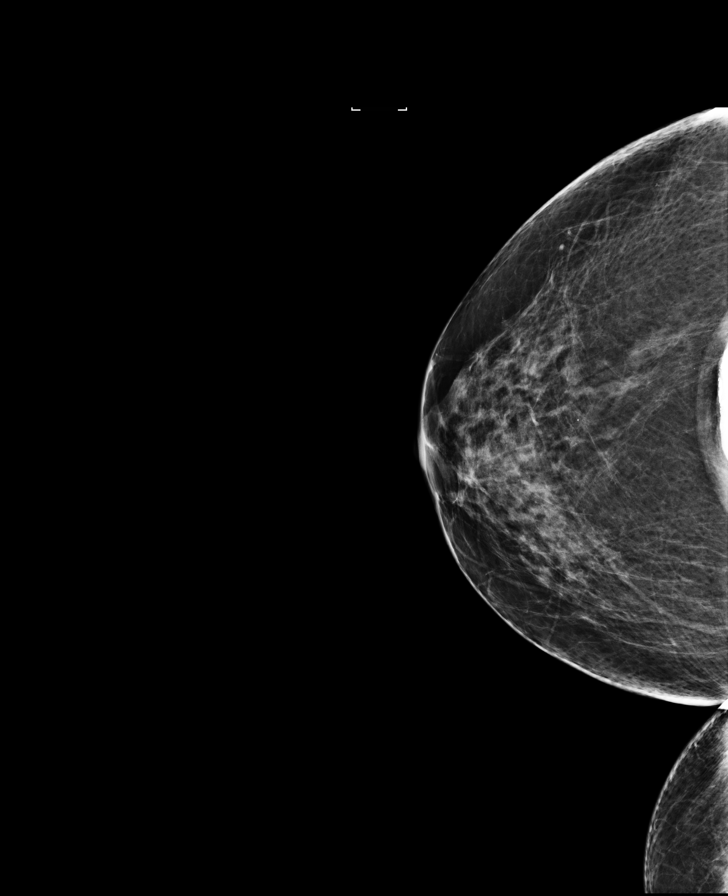

[L CC]
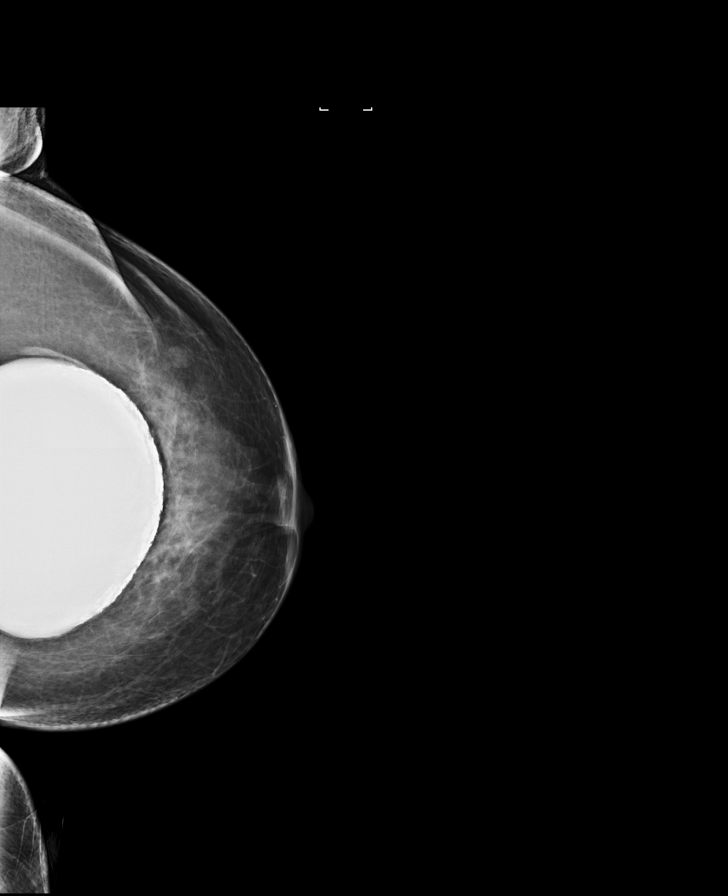

[R CC synth-2D]
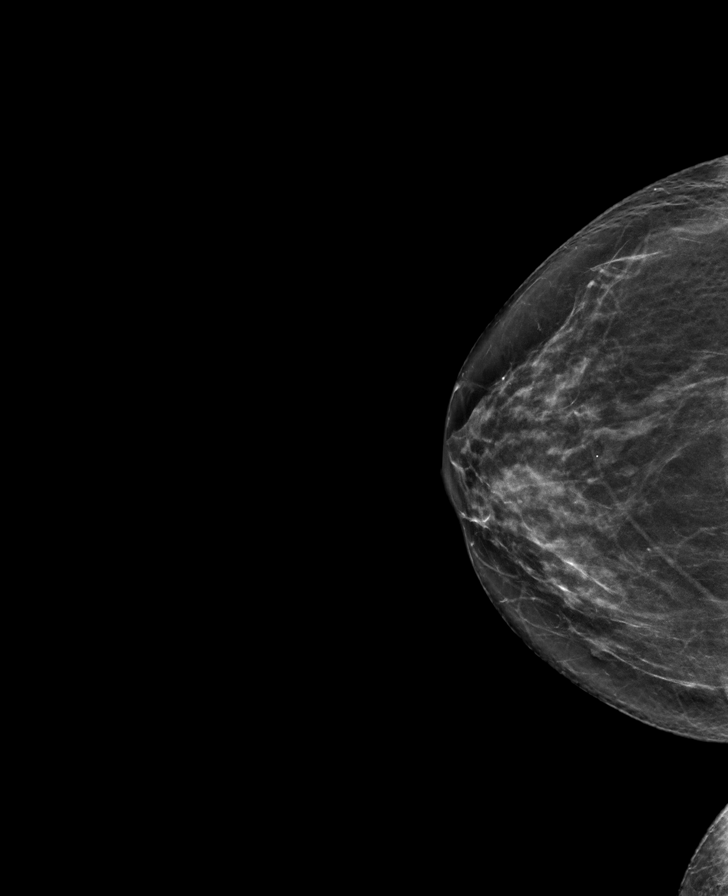

[L MLO synth-2D]
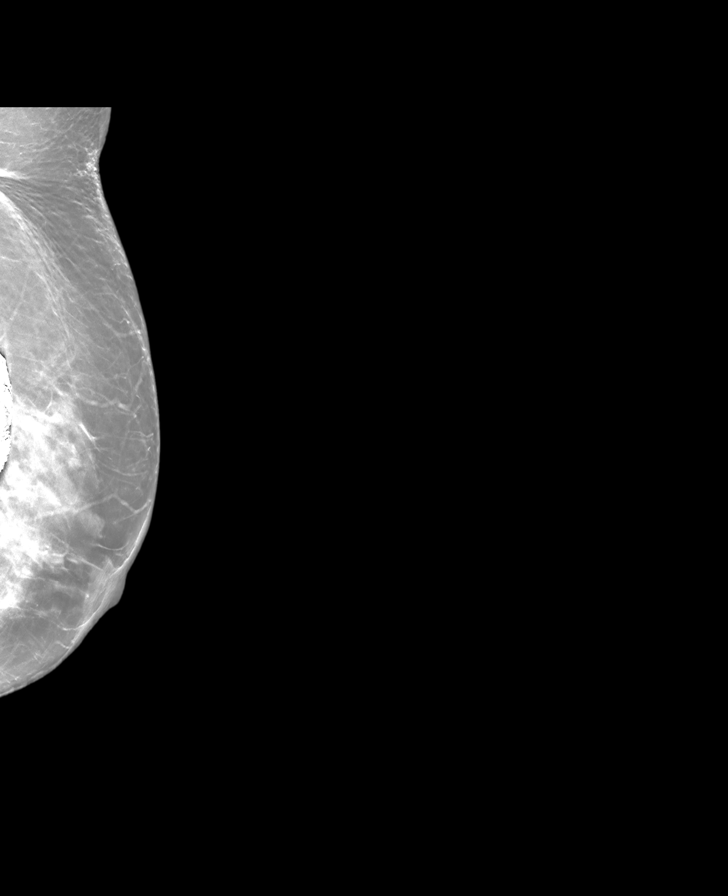

[L CC synth-2D]
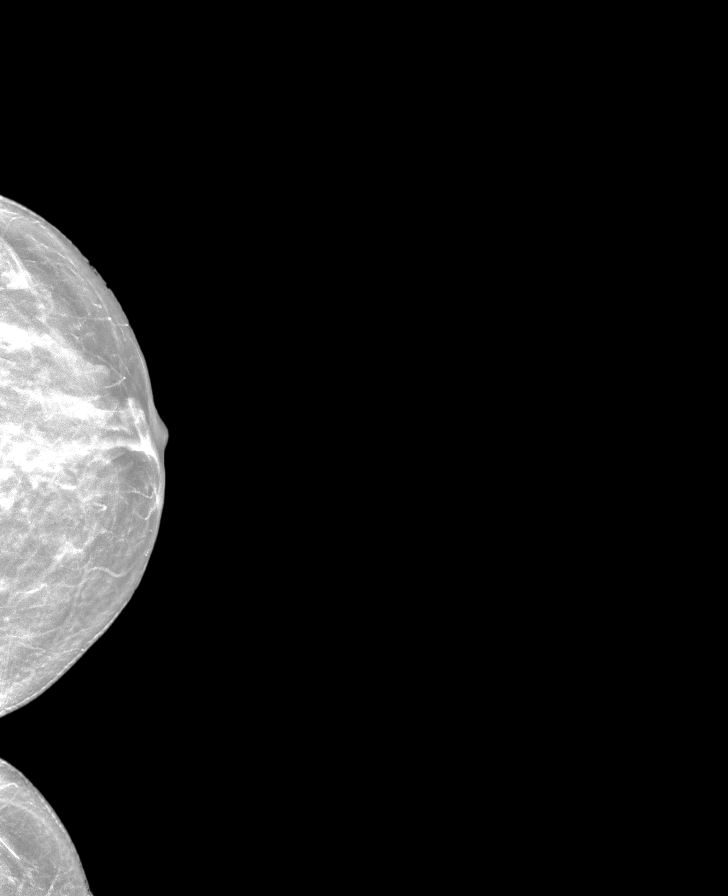

[R MLO synth-2D]
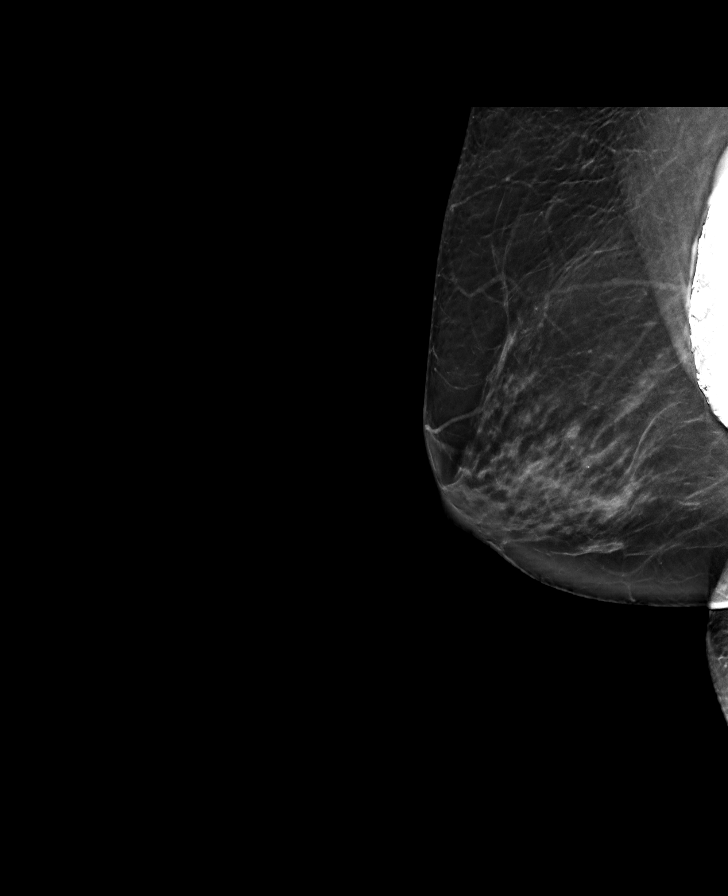

[8 of 28 positions shown; findings below may reference images not displayed]

ACR Breast Density Category c: The breast tissue is heterogeneously
dense, which may obscure small masses.
FINDINGS: The patient has a retropectoral RIGHT implant and a prepectoral LEFT
implant. Standard 2D full field CC and MLO views of both breasts and
tomosynthesis and synthesized implant displaced CC and MLO views of
both breasts were obtained.

There are no findings suspicious for malignancy. Images were
processed with CAD.
IMPRESSION: No mammographic evidence of malignancy. A result letter of this
screening mammogram will be mailed directly to the patient.

RECOMMENDATION:
Screening mammogram in one year. (Code:UB-P-7D3)

BI-RADS CATEGORY  1:  Negative.

## 2021-02-22 ENCOUNTER — Inpatient Hospital Stay: Payer: Medicare Other | Admitting: Hematology & Oncology

## 2021-02-22 ENCOUNTER — Other Ambulatory Visit: Payer: Self-pay

## 2021-02-22 ENCOUNTER — Inpatient Hospital Stay: Payer: Medicare Other | Attending: Hematology & Oncology

## 2021-02-22 ENCOUNTER — Encounter: Payer: Self-pay | Admitting: Hematology & Oncology

## 2021-02-22 ENCOUNTER — Telehealth: Payer: Self-pay

## 2021-02-22 ENCOUNTER — Inpatient Hospital Stay: Payer: Medicare Other

## 2021-02-22 VITALS — BP 120/55 | HR 77 | Temp 98.4°F | Resp 16 | Wt 163.0 lb

## 2021-02-22 DIAGNOSIS — D45 Polycythemia vera: Secondary | ICD-10-CM

## 2021-02-22 DIAGNOSIS — G7000 Myasthenia gravis without (acute) exacerbation: Secondary | ICD-10-CM | POA: Diagnosis not present

## 2021-02-22 DIAGNOSIS — Z7982 Long term (current) use of aspirin: Secondary | ICD-10-CM | POA: Diagnosis not present

## 2021-02-22 DIAGNOSIS — Z79899 Other long term (current) drug therapy: Secondary | ICD-10-CM | POA: Insufficient documentation

## 2021-02-22 LAB — CMP (CANCER CENTER ONLY)
ALT: 25 U/L (ref 0–44)
AST: 22 U/L (ref 15–41)
Albumin: 4 g/dL (ref 3.5–5.0)
Alkaline Phosphatase: 119 U/L (ref 38–126)
Anion gap: 5 (ref 5–15)
BUN: 19 mg/dL (ref 8–23)
CO2: 29 mmol/L (ref 22–32)
Calcium: 10.8 mg/dL — ABNORMAL HIGH (ref 8.9–10.3)
Chloride: 107 mmol/L (ref 98–111)
Creatinine: 0.8 mg/dL (ref 0.44–1.00)
GFR, Estimated: >60 mL/min (ref >60)
Glucose, Bld: 79 mg/dL (ref 70–99)
Potassium: 4.5 mmol/L (ref 3.5–5.1)
Sodium: 141 mmol/L (ref 135–145)
Total Bilirubin: 0.8 mg/dL (ref 0.3–1.2)
Total Protein: 6.7 g/dL (ref 6.5–8.1)

## 2021-02-22 LAB — CBC WITH DIFFERENTIAL (CANCER CENTER ONLY)
Abs Immature Granulocytes: 0.01 10*3/uL (ref 0.00–0.07)
Basophils Absolute: 0 10*3/uL (ref 0.0–0.1)
Basophils Relative: 1 %
Eosinophils Absolute: 0.1 10*3/uL (ref 0.0–0.5)
Eosinophils Relative: 3 %
HCT: 41.1 % (ref 36.0–46.0)
Hemoglobin: 12.4 g/dL (ref 12.0–15.0)
Immature Granulocytes: 0 %
Lymphocytes Relative: 43 %
Lymphs Abs: 1.5 10*3/uL (ref 0.7–4.0)
MCH: 23.6 pg — ABNORMAL LOW (ref 26.0–34.0)
MCHC: 30.2 g/dL (ref 30.0–36.0)
MCV: 78.1 fL — ABNORMAL LOW (ref 80.0–100.0)
Monocytes Absolute: 0.5 10*3/uL (ref 0.1–1.0)
Monocytes Relative: 14 %
Neutro Abs: 1.3 10*3/uL — ABNORMAL LOW (ref 1.7–7.7)
Neutrophils Relative %: 39 %
Platelet Count: 271 10*3/uL (ref 150–400)
RBC: 5.26 MIL/uL — ABNORMAL HIGH (ref 3.87–5.11)
RDW: 17 % — ABNORMAL HIGH (ref 11.5–15.5)
WBC Count: 3.4 10*3/uL — ABNORMAL LOW (ref 4.0–10.5)
nRBC: 0 % (ref 0.0–0.2)

## 2021-02-22 LAB — FERRITIN: Ferritin: 5 ng/mL — ABNORMAL LOW (ref 11–307)

## 2021-02-22 LAB — IRON AND TIBC
Iron: 46 ug/dL (ref 41–142)
Saturation Ratios: 11 % — ABNORMAL LOW (ref 21–57)
TIBC: 431 ug/dL (ref 236–444)
UIBC: 385 ug/dL — ABNORMAL HIGH (ref 120–384)

## 2021-02-22 LAB — LACTATE DEHYDROGENASE: LDH: 153 U/L (ref 98–192)

## 2021-02-22 MED ORDER — MOMETASONE FUROATE 0.1 % EX CREA: 1.0000 "application " | TOPICAL_CREAM | Freq: Every day | CUTANEOUS | 0 refills | Status: DC

## 2021-02-22 NOTE — Progress Notes (Signed)
Hematology and Oncology Follow Up Visit  Heather Brown [id number] 1946-04-14 75 y.o. 02/22/2021   Principle Diagnosis:  Polycythemia vera -- DNMT3A (+),  Epo = 5.7  Current Therapy:   Phlebotomy to maintain hematocrit below 45% Aspirin 81 mg p.o. daily     Interim History:  Heather Brown is back for follow-up.  We see her every 6 months.  Since we last saw her, her husband has been dealing with the myasthenia gravis.  He is on medication for this.  They did go on a low vacation down to [place] to see some grandsons.  They had a good time while they are down there.  We last saw her in January, her ferritin was 8 with an iron saturation of 11%.  She has had no problems with nausea or vomiting.  There is been no cough or shortness of breath.  She has had a little bit of a rash on her back.  We will call in some Elocon cream for this.  She has had no fever.  She is avoided the COVID.  Currently, I would say performance status is ECOG 1.     Medications:  Current Outpatient Medications:    aspirin 81 MG tablet, Take 81 mg by mouth daily., Disp: , Rfl:    Biotin 2500 MCG CAPS, Take 1 tablet by mouth daily., Disp: , Rfl:    CALCIUM CARBONATE ANTACID PO, Take 1,000 mg by mouth daily as needed for indigestion or heartburn. , Disp: , Rfl:    Cholecalciferol (VITAMIN D3) 1000 UNITS CAPS, Take 1,000 Units by mouth daily., Disp: , Rfl:    ibuprofen (ADVIL) 600 MG tablet, Take 1 tablet (600 mg total) by mouth every 6 (six) hours as needed for mild pain or moderate pain. For use AFTER surgery, Disp: 30 tablet, Rfl: 0   levothyroxine (SYNTHROID) 75 MCG tablet, 75 mcg daily., Disp: , Rfl:    loratadine (CLARITIN) 10 MG tablet, Take 10 mg by mouth daily as needed for allergies. , Disp: , Rfl:    Multiple Vitamin (MULTIVITAMIN) capsule, Take 1 capsule by mouth daily., Disp: , Rfl:    Nutritional Supplements (JOINT FORMULA PO), Take 1 capsule by mouth 1 day or 1 dose., Disp: , Rfl:    tretinoin  (RETIN-A) 0.05 % cream, Apply pea sized amount to affected area of face nightly as needed., Disp: , Rfl: 5   triamcinolone ointment (KENALOG) 0.5 %, Apply 1 application topically 2 (two) times daily., Disp: 30 g, Rfl: 2  Allergies:  Allergies  Allergen Reactions   Sulfa Antibiotics Hives   Cephalexin Rash    Face turned bright red.    Past Medical History, Surgical history, Social history, and Family History were reviewed and updated.  Review of Systems: Review of Systems  HENT:  Negative.    Eyes: Negative.   Respiratory:  Positive for shortness of breath.   Cardiovascular: Negative.   Gastrointestinal:  Positive for abdominal distention.  Endocrine: Negative.   Musculoskeletal:  Positive for arthralgias, flank pain, myalgias and neck stiffness.  Skin: Negative.   Neurological: Negative.   Hematological: Negative.   Psychiatric/Behavioral: Negative.     Physical Exam:  weight is 163 lb (73.9 kg). Her oral temperature is 98.4 F (36.9 C). Her blood pressure is 120/55 (abnormal) and her pulse is 77. Her respiration is 16 and oxygen saturation is 98%.   Wt Readings from Last 3 Encounters:  02/22/21 163 lb (73.9 kg)  01/25/21 164 lb 9.6 oz (  74.7 kg)  08/24/20 163 lb 6.4 oz (74.1 kg)    Physical Exam Vitals reviewed.  HENT:     Head: Normocephalic and atraumatic.  Eyes:     Pupils: Pupils are equal, round, and reactive to light.  Cardiovascular:     Rate and Rhythm: Normal rate and regular rhythm.     Heart sounds: Normal heart sounds.  Pulmonary:     Effort: Pulmonary effort is normal.     Breath sounds: Normal breath sounds.  Abdominal:     General: Bowel sounds are normal.     Palpations: Abdomen is soft.  Musculoskeletal:        General: No tenderness or deformity. Normal range of motion.     Cervical back: Normal range of motion.     Comments: Her big toe on her right foot is a little swollen.  There is some erythema associated with this.  She does have decent  range of motion.  There is no tenderness to palpation over the area of erythema.  Lymphadenopathy:     Cervical: No cervical adenopathy.  Skin:    General: Skin is warm and dry.     Findings: No erythema or rash.  Neurological:     Mental Status: She is alert and oriented to person, place, and time.  Psychiatric:        Behavior: Behavior normal.        Thought Content: Thought content normal.        Judgment: Judgment normal.     Lab Results  Component Value Date   WBC 3.4 (L) 02/22/2021   HGB 12.4 02/22/2021   HCT 41.1 02/22/2021   MCV 78.1 (L) 02/22/2021   PLT 271 02/22/2021     Chemistry      Component Value Date/Time   NA 139 12/13/2020 1145   NA 140 08/19/2017 1517   K 4.6 12/13/2020 1145   CL 106 12/13/2020 1145   CO2 28 12/13/2020 1145   BUN 18 12/13/2020 1145   BUN 15 08/19/2017 1517   CREATININE 0.74 12/13/2020 1145   CREATININE 0.78 08/24/2020 0954   CREATININE 0.79 03/10/2014 0836      Component Value Date/Time   CALCIUM 10.8 (H) 12/13/2020 1145   ALKPHOS 125 12/13/2020 1145   AST 21 12/13/2020 1145   AST 23 08/24/2020 0954   ALT 25 12/13/2020 1145   ALT 25 08/24/2020 0954   BILITOT 0.9 12/13/2020 1145   BILITOT 0.9 08/24/2020 0954       Impression and Plan: Heather Brown is a 75 year old white female.  She has polycythemia vera.    We will not phlebotomize her today.  I think because her blood is doing so well, we probably can get her back in 6 months.  We will stay strong strong and prayer for her and her husband.  I know it is difficult on him and her with the myasthenia gravis.     [name], MD 7/27/202210:22 AM

## 2021-02-22 NOTE — Telephone Encounter (Signed)
Appts made and pt is aware per 02/22/21 los   [employer]

## 2021-03-02 ENCOUNTER — Other Ambulatory Visit: Payer: Self-pay

## 2021-03-02 ENCOUNTER — Telehealth (HOSPITAL_BASED_OUTPATIENT_CLINIC_OR_DEPARTMENT_OTHER): Payer: Self-pay | Admitting: Nurse Practitioner

## 2021-03-02 ENCOUNTER — Ambulatory Visit (INDEPENDENT_AMBULATORY_CARE_PROVIDER_SITE_OTHER): Payer: Medicare Other | Admitting: Nurse Practitioner

## 2021-03-02 ENCOUNTER — Encounter (HOSPITAL_BASED_OUTPATIENT_CLINIC_OR_DEPARTMENT_OTHER): Payer: Self-pay | Admitting: Nurse Practitioner

## 2021-03-02 VITALS — BP 146/85 | HR 77 | Ht 63.0 in | Wt 164.8 lb

## 2021-03-02 DIAGNOSIS — Z7689 Persons encountering health services in other specified circumstances: Secondary | ICD-10-CM

## 2021-03-02 DIAGNOSIS — H6593 Unspecified nonsuppurative otitis media, bilateral: Secondary | ICD-10-CM

## 2021-03-02 DIAGNOSIS — E038 Other specified hypothyroidism: Secondary | ICD-10-CM

## 2021-03-02 DIAGNOSIS — E7800 Pure hypercholesterolemia, unspecified: Secondary | ICD-10-CM | POA: Diagnosis not present

## 2021-03-02 DIAGNOSIS — Z0000 Encounter for general adult medical examination without abnormal findings: Secondary | ICD-10-CM | POA: Insufficient documentation

## 2021-03-02 DIAGNOSIS — Z1159 Encounter for screening for other viral diseases: Secondary | ICD-10-CM

## 2021-03-02 DIAGNOSIS — D45 Polycythemia vera: Secondary | ICD-10-CM | POA: Diagnosis not present

## 2021-03-02 DIAGNOSIS — H6123 Impacted cerumen, bilateral: Secondary | ICD-10-CM

## 2021-03-02 DIAGNOSIS — E559 Vitamin D deficiency, unspecified: Secondary | ICD-10-CM | POA: Diagnosis not present

## 2021-03-02 HISTORY — DX: Unspecified nonsuppurative otitis media, bilateral: H65.93

## 2021-03-02 MED ORDER — FLUTICASONE PROPIONATE 50 MCG/ACT NA SUSP: 2.0000 | Freq: Every day | NASAL | 6 refills | Status: DC

## 2021-03-02 NOTE — Telephone Encounter (Signed)
Called Pt to schedule Medicare Wellness in 6 months  ([place] - Sep 02, 2021)

## 2021-03-02 NOTE — Progress Notes (Signed)
Worthy Keeler, DNP, AGNP-c Primary Care Services ______________________________________________________________________  HPI Heather Brown is a 75 y.o. female presenting to [facility] today to establish care.   Patient Care Team: Waldon Sheerin, Coralee Pesa, NP as PCP - General (Nurse Practitioner)  Health Maintenance  Topic Date Due   Hepatitis C Screening: USPSTF Recommendation to screen - Ages 75-79 yo.  Never done   Zoster (Shingles) Vaccine (1 of 2) Never done   Pneumonia vaccines (1 of 2 - PCV13) Never done   COVID-19 Vaccine (4 - Booster for Pfizer series) 08/30/2020   Flu Shot  04/28/2021*   Tetanus Vaccine  03/12/2023   Colon Cancer Screening  10/01/2027   DEXA scan (bone density measurement)  Completed   HPV Vaccine  Aged Out  *Topic was postponed. The date shown is not the original due date.     Concerns today: Establish Care Ear wax build-up Especially the right ear Experiencing some hearing loss when wax build-up  Hx of vertigo 2 months ago went through therapy and resolved Dr. Chalmers Cater for hypothyroidism She keeps up on those labs and manages medications Oncology team for polycythemia vera Following up with lab work every 6 months at this time.    Patient Active Problem List   Diagnosis Date Noted   Encounter to establish care 03/02/2021   Fluid level behind tympanic membrane of both ears 03/02/2021   Nontoxic single thyroid nodule 01/25/2021   Postmenopausal 01/25/2021   H/O abdominal hysterectomy 01/25/2021   Age-related osteoporosis without current pathological fracture [id number]   Ruptured silicone breast implant 01/11/2020   Polycythemia vera ([place]) 12/02/2017   Hypothyroidism 04/07/2015   Elevated LDL cholesterol level 04/07/2015   Vitamin D deficiency 04/07/2015    PHQ9 Today: Depression screen [facility] 2/9 03/02/2021 01/25/2021  Decreased Interest 0 0  Down, Depressed, Hopeless 0 0  PHQ - 2 Score  0 0  Altered sleeping 0 -  Tired, decreased energy 1 -  Change in appetite 0 -  Feeling bad or failure about yourself  0 -  Trouble concentrating 0 -  Moving slowly or fidgety/restless 0 -  Suicidal thoughts 0 -  PHQ-9 Score 1 -   GAD7 Today: GAD 7 : Generalized Anxiety Score 03/02/2021  Nervous, Anxious, on Edge 0  Control/stop worrying 0  Worry too much - different things 0  Trouble relaxing 0  Restless 0  Easily annoyed or irritable 0  Afraid - awful might happen 0  Total GAD 7 Score 0   ______________________________________________________________________ PMH Past Medical History:  Diagnosis Date   Dyspareunia    dryness with intercourse   Endometriosis 1982   found at [facility]   GERD (gastroesophageal reflux disease)    Heart murmur    History of left shoulder fracture 2017   Hypothyroidism    Left ACL tear    Osteopenia 2009   Pneumonia    Polycythemia vera ([place]) 12/02/2017   PONV (postoperative nausea and vomiting)    Thyroid nodule    followed by Dr Chalmers Cater   Urinary incontinence    with coughing/sneezing    ROS All review of systems negative except what is listed in the HPI  PHYSICAL EXAM Physical Exam Vitals and nursing note reviewed.  Constitutional:      Appearance: Normal appearance.  HENT:     Head: Normocephalic and atraumatic.     Right Ear: A middle ear effusion is present. There is impacted cerumen.  Left Ear: A middle ear effusion is present. There is impacted cerumen.     Ears:     Comments: Irrigation performed with warm water- canals cleared. Patient tolerated well. TMs well visualized.     Nose: Nose normal.     Mouth/Throat:     Mouth: Mucous membranes are moist.     Pharynx: Oropharynx is clear.  Eyes:     Extraocular Movements: Extraocular movements intact.     Conjunctiva/sclera: Conjunctivae normal.     Pupils: Pupils are equal, round, and reactive to light.  Neck:     Vascular: No carotid bruit.  Cardiovascular:     Rate and  Rhythm: Normal rate and regular rhythm.     Pulses: Normal pulses.     Heart sounds: Normal heart sounds.  Pulmonary:     Effort: Pulmonary effort is normal.     Breath sounds: Normal breath sounds.  Abdominal:     General: Abdomen is flat. Bowel sounds are normal. There is no distension.     Palpations: Abdomen is soft.     Tenderness: There is no abdominal tenderness. There is no right CVA tenderness, left CVA tenderness, guarding or rebound.  Musculoskeletal:        General: Normal range of motion.     Cervical back: Normal range of motion and neck supple. No tenderness.     Right lower leg: No edema.     Left lower leg: No edema.  Lymphadenopathy:     Cervical: No cervical adenopathy.  Skin:    General: Skin is warm and dry.     Capillary Refill: Capillary refill takes less than 2 seconds.  Neurological:     General: No focal deficit present.     Mental Status: She is alert and oriented to person, place, and time.  Psychiatric:        Mood and Affect: Mood normal.        Behavior: Behavior normal.        Thought Content: Thought content normal.        Judgment: Judgment normal.   ______________________________________________________________________ ASSESSMENT AND PLAN Problem List Items Addressed This Visit     Polycythemia vera ([place]) - Primary (Chronic)    Managed by oncology Labs reviewed from oncology office today and show good control Follow-up with onc as scheduled        Hypothyroidism    Thyroid managed by endocrinology.  Will obtain labs today as it has been about 1 year since monitored.  Will make changes to plan of care as necessary based on labs.        Relevant Orders   TSH   Elevated LDL cholesterol level    Will monitor labs today Not on statin therapy Diet and exercise recommendations provided Will make changes to plan of care as necessary based on labs       Relevant Orders   Lipid panel   Vitamin D deficiency    Will monitor labs today  and make changes to plan of care as necessary Hx of osteoporosis with fractures Close monitoring recommended       Relevant Orders   VITAMIN D 25 Hydroxy (Vit-D Deficiency, Fractures)   Encounter to establish care    Review of current and past medical history, social history, medication, and family history.  Review of care gaps and health maintenance recommendations.  Records from recent providers to be requested if not available in Chart Review or Care Everywhere.  Recommendations for health  maintenance, diet, and exercise provided.  Due for Medicare AWV- recommend scheduling this for 6 months to discuss routine screenings and care F/U sooner if needed.        Fluid level behind tympanic membrane of both ears    Serous effusion present bilaterally.  Most likely related to eustachian tube dysfunction/allergies Recommend fluticasone nasal spray daily for symptom management. Continue claritin daily F/U if symptoms worsen or fail to improve       Relevant Medications   fluticasone (FLONASE) 50 MCG/ACT nasal spray   Other Visit Diagnoses     Impacted cerumen of both ears       Encounter for hepatitis C screening test for low risk patient       Relevant Orders   Hepatitis C Antibody       Education provided today during visit and on AVS for patient to review at home.  Diet and Exercise recommendations provided.  Current diagnoses and recommendations discussed. HM recommendations reviewed with recommendations.    Outpatient Encounter Medications as of 03/02/2021  Medication Sig   aspirin 81 MG tablet Take 81 mg by mouth daily.   Biotin 2500 MCG CAPS Take 1 tablet by mouth daily.   CALCIUM CARBONATE ANTACID PO Take 1,000 mg by mouth daily as needed for indigestion or heartburn.    Cholecalciferol (VITAMIN D3) 1000 UNITS CAPS Take 1,000 Units by mouth daily.   fluticasone (FLONASE) 50 MCG/ACT nasal spray Place 2 sprays into both nostrils daily.   levothyroxine (SYNTHROID) 75  MCG tablet 75 mcg daily.   loratadine (CLARITIN) 10 MG tablet Take 10 mg by mouth daily as needed for allergies.    mometasone (ELOCON) 0.1 % cream Apply 1 application topically daily.   Multiple Vitamin (MULTIVITAMIN) capsule Take 1 capsule by mouth daily.   Nutritional Supplements (JOINT FORMULA PO) Take 1 capsule by mouth 1 day or 1 dose.   tretinoin (RETIN-A) 0.05 % cream Apply pea sized amount to affected area of face nightly as needed.   [DISCONTINUED] ibuprofen (ADVIL) 600 MG tablet Take 1 tablet (600 mg total) by mouth every 6 (six) hours as needed for mild pain or moderate pain. For use AFTER surgery   [DISCONTINUED] triamcinolone ointment (KENALOG) 0.5 % Apply 1 application topically 2 (two) times daily.   No facility-administered encounter medications on file as of 03/02/2021.    Return in 6 months (on 09/02/2021) for Medicare Wellness in 6 months.  Time: 65 minutes, >50% spent counseling, care coordination, chart review, and documentation.   Orma Render, DNP, AGNP-c

## 2021-03-02 NOTE — Assessment & Plan Note (Signed)
Managed by oncology Labs reviewed from oncology office today and show good control Follow-up with onc as scheduled

## 2021-03-02 NOTE — Patient Instructions (Addendum)
Recommendations from today's visit: Novavax is the name of the newest COVID vaccine - this should be available in the next few weeks at the pharmacy as a booster for COVID-19 I do recommend that you get the second Shingles vaccine to help best protect you Unless you need me sooner, lets plan to do your Medicare Wellness visit in 6 months or so- you can schedule that before you leave today  Information on diet, exercise, and health maintenance recommendations are listed below. This is information to help you be sure you are on track for optimal health and monitoring.   Please look over this and let us know if you have any questions or if you have completed any of the health maintenance outside of [facility] so that we can be sure your records are up to date.  ___________________________________________________________  Thank you for choosing [facility] for your Primary Care needs. I am excited for the opportunity to partner with you to meet your health care goals. It was a pleasure meeting you today!  I am an Adult-Geriatric Nurse Practitioner with a background in caring for patients for more than 20 years. I provide primary care and sports medicine services to patients age 66 and older within this office. I am also the director of the APP Fellowship with [facility].   I am passionate about providing the best service to you through preventive medicine and supportive care. I consider you a part of the medical team and value your input. I work diligently to ensure that you are heard and your needs are met in a safe and effective manner. I want you to feel comfortable with me as your provider and want you to know that your health concerns are important to me.  For your information, our office hours are Monday- Friday 8:00 AM - 5:00 PM At this time I am not in the office on Wednesdays.  If you have questions or concerns, please call our office at [phone] or  send [place] a MyChart message and we will respond as quickly as possible.   For all urgent or time sensitive needs we ask that you please call the office to avoid delays. MyChart is not constantly monitored and replies may take up to 72 business hours.  MyChart Policy: MyChart allows for you to see your visit notes, after visit summary, provider recommendations, lab and tests results, make an appointment, request refills, and contact your provider or the office for non-urgent questions or concerns. Providers are seeing patients during normal business hours and do not have built in time to review MyChart messages.  We ask that you allow a minimum of 4 business days for responses to [employer]. For this reason, please do not send urgent requests through [place]. Please call the office at [phone]. Complex MyChart concerns may require a visit. Your provider may request you schedule a virtual or in person visit to ensure we are providing the best care possible. MyChart messages sent after 4:00 PM on Friday will not be received by the provider until Monday morning.    Lab and Test Results: You will receive your lab and test results on MyChart as soon as they are completed and results have been sent by the lab or testing facility. Due to this service, you will receive your results BEFORE your provider.  I review lab and tests results each morning prior to seeing patients. Some results require collaboration with other providers to ensure you  are receiving the most appropriate care. For this reason, we ask that you please allow a minimum of 4 business days for your provider to receive and review lab and test results and contact you about these.  Most lab and test result comments from the provider will be sent through [place]. Your provider may recommend changes to the plan of care, follow-up visits, repeat testing, ask questions, or request an office visit to discuss these results. You may reply directly  to this message or call the office at [phone] to provide information for the provider or set up an appointment. In some instances, you will be called with test results and recommendations. Please let us know if this is preferred and we will make note of this in your chart to provide this for you.    If you have not heard a response to your lab or test results in 72 business hours, please call the office to let us know.   After Hours: For all non-emergency after hours needs, please call the office at [phone] and select the option to reach the on-call provider service. On-call services are shared between multiple [facility] offices and therefore it will not be possible to speak directly with your provider. On-call providers may provide medical advice and recommendations, but are unable to provide refills for maintenance medications.  For all emergency or urgent medical needs after normal business hours, we recommend that you seek care at the closest Urgent Care or Emergency Department to ensure appropriate treatment in a timely manner.  [facility] [place] has a 24 hour emergency room located on the ground floor for your convenience.    Please do not hesitate to reach out to [place] with concerns.   Thank you, again, for choosing me as your health care partner. I appreciate your trust and look forward to learning more about you.   Worthy Keeler, DNP, AGNP-c ___________________________________________________________  Health Maintenance Recommendations Screening Testing Mammogram Every 1 -2 years based on history and risk factors Starting at age 60 Pap Smear Ages 21-39 every 3 years Ages 55-65 every 5 years with HPV testing More frequent testing may be required based on results and history Colon Cancer Screening Every 1-10 years based on test performed, risk factors, and history Starting at age 36 Bone Density Screening Every 2-10 years based on history Starting at  age 52 for women Recommendations for men differ based on medication usage, history, and risk factors AAA Screening One time ultrasound Men 70-52 years old who have every smoked Lung Cancer Screening Low Dose Lung CT every 12 months Age 32-80 years with a 30 pack-year smoking history who still smoke or who have quit within the last 15 years  Screening Labs Routine  Labs: Complete Blood Count (CBC), Complete Metabolic Panel (CMP), Cholesterol (Lipid Panel) Every 6-12 months based on history and medications May be recommended more frequently based on current conditions or previous results Hemoglobin A1c Lab Every 3-12 months based on history and previous results Starting at age 64 or earlier with diagnosis of diabetes, high cholesterol, BMI >26, and/or risk factors Frequent monitoring for patients with diabetes to ensure blood sugar control Thyroid Panel (TSH w/ T3 & T4) Every 6 months based on history, symptoms, and risk factors May be repeated more often if on medication HIV One time testing for all patients 69 and older May be repeated more frequently for patients with increased risk factors or exposure Hepatitis C One time testing for all patients 64  and older May be repeated more frequently for patients with increased risk factors or exposure Gonorrhea, Chlamydia Every 12 months for all sexually active persons 13-24 years Additional monitoring may be recommended for those who are considered high risk or who have symptoms PSA Men 56-8 years old with risk factors Additional screening may be recommended from age 66-69 based on risk factors, symptoms, and history  Vaccine Recommendations Tetanus Booster All adults every 10 years Flu Vaccine All patients 6 months and older every year COVID Vaccine All patients 12 years and older Initial dosing with booster May recommend additional booster based on age and health history HPV Vaccine 2 doses all patients age 45-26 Dosing may be  considered for patients over 26 Shingles Vaccine (Shingrix) 2 doses all adults 70 years and older Pneumonia (Pneumovax 23) All adults 15 years and older May recommend earlier dosing based on health history Pneumonia (Prevnar 53) All adults 74 years and older Dosed 1 year after Pneumovax 23  Additional Screening, Testing, and Vaccinations may be recommended on an individualized basis based on family history, health history, risk factors, and/or exposure.  __________________________________________________________  Diet Recommendations for All Patients  I recommend that all patients maintain a diet low in saturated fats, carbohydrates, and cholesterol. While this can be challenging at first, it is not impossible and small changes can make big differences.  Things to try: Decreasing the amount of soda, sweet tea, and/or juice to one or less per day and replace with water While water is always the first choice, if you do not like water you may consider adding a water additive without sugar to improve the taste other sugar free drinks Replace potatoes with a brightly colored vegetable at dinner Use healthy oils, such as canola oil or olive oil, instead of butter or hard margarine Limit your bread intake to two pieces or less a day Replace regular pasta with low carb pasta options Bake, broil, or grill foods instead of frying Monitor portion sizes  Eat smaller, more frequent meals throughout the day instead of large meals  An important thing to remember is, if you love foods that are not great for your health, you don't have to give them up completely. Instead, allow these foods to be a reward when you have done well. Allowing yourself to still have special treats every once in a while is a nice way to tell yourself thank you for working hard to keep yourself healthy.   Also remember that every day is a new day. If you have a bad day and "fall off the wagon", you can still climb right back  up and keep moving along on your journey!  We have resources available to help you!  Some websites that may be helpful include: www.http://carter.biz/  Www.[employer]Well[employer].com _____________________________________________________________  Activity Recommendations for All Patients  I recommend that all adults get at least 20 minutes of moderate physical activity that elevates your heart rate at least 5 days out of the week.  Some examples include: Walking or jogging at a pace that allows you to carry on a conversation Cycling (stationary bike or outdoors) Water aerobics Yoga Weight lifting Dancing If physical limitations prevent you from putting stress on your joints, exercise in a pool or seated in a chair are excellent options.  Do determine your MAXIMUM heart rate for activity: YOUR AGE - 220 = MAX HeartRate   Remember! Do not push yourself too hard.  Start slowly and build up your pace, speed, weight, time  in exercise, etc.  Allow your body to rest between exercise and get good sleep. You will need more water than normal when you are exerting yourself. Do not wait until you are thirsty to drink. Drink with a purpose of getting in at least 8, 8 ounce glasses of water a day plus more depending on how much you exercise and sweat.    If you begin to develop dizziness, chest pain, abdominal pain, jaw pain, shortness of breath, headache, vision changes, lightheadedness, or other concerning symptoms, stop the activity and allow your body to rest. If your symptoms are severe, seek emergency evaluation immediately. If your symptoms are concerning, but not severe, please let us know so that we can recommend further evaluation.   ________________________________________________________________

## 2021-03-02 NOTE — Assessment & Plan Note (Signed)
Will monitor labs today and make changes to plan of care as necessary Hx of osteoporosis with fractures Close monitoring recommended

## 2021-03-02 NOTE — Assessment & Plan Note (Signed)
Review of current and past medical history, social history, medication, and family history.  Review of care gaps and health maintenance recommendations.  Records from recent providers to be requested if not available in Chart Review or Care Everywhere.  Recommendations for health maintenance, diet, and exercise provided.  Due for Medicare AWV- recommend scheduling this for 6 months to discuss routine screenings and care F/U sooner if needed.

## 2021-03-02 NOTE — Assessment & Plan Note (Signed)
Will monitor labs today Not on statin therapy Diet and exercise recommendations provided Will make changes to plan of care as necessary based on labs

## 2021-03-02 NOTE — Assessment & Plan Note (Signed)
Thyroid managed by endocrinology.  Will obtain labs today as it has been about 1 year since monitored.  Will make changes to plan of care as necessary based on labs.

## 2021-03-02 NOTE — Assessment & Plan Note (Signed)
Serous effusion present bilaterally.  Most likely related to eustachian tube dysfunction/allergies Recommend fluticasone nasal spray daily for symptom management. Continue claritin daily F/U if symptoms worsen or fail to improve

## 2021-03-03 LAB — LIPID PANEL
Chol/HDL Ratio: 3.6 ratio (ref 0.0–4.4)
Cholesterol, Total: 149 mg/dL (ref 100–199)
HDL: 41 mg/dL (ref >39)
LDL Chol Calc (NIH): 86 mg/dL (ref 0–99)
Triglycerides: 120 mg/dL (ref 0–149)
VLDL Cholesterol Cal: 22 mg/dL (ref 5–40)

## 2021-03-03 LAB — VITAMIN D 25 HYDROXY (VIT D DEFICIENCY, FRACTURES): Vit D, 25-Hydroxy: 36.3 ng/mL (ref 30.0–100.0)

## 2021-03-03 LAB — TSH: TSH: 1.54 u[IU]/mL (ref 0.450–4.500)

## 2021-03-08 NOTE — Progress Notes (Signed)
Labs look great! No changes.

## 2021-05-02 ENCOUNTER — Telehealth (HOSPITAL_BASED_OUTPATIENT_CLINIC_OR_DEPARTMENT_OTHER): Payer: Self-pay

## 2021-05-05 NOTE — Telephone Encounter (Signed)
Error

## 2021-05-16 DIAGNOSIS — M25511 Pain in right shoulder: Secondary | ICD-10-CM | POA: Diagnosis not present

## 2021-05-23 DIAGNOSIS — D485 Neoplasm of uncertain behavior of skin: Secondary | ICD-10-CM | POA: Diagnosis not present

## 2021-05-23 DIAGNOSIS — L570 Actinic keratosis: Secondary | ICD-10-CM | POA: Diagnosis not present

## 2021-06-28 DIAGNOSIS — H40013 Open angle with borderline findings, low risk, bilateral: Secondary | ICD-10-CM | POA: Diagnosis not present

## 2021-06-28 DIAGNOSIS — H353111 Nonexudative age-related macular degeneration, right eye, early dry stage: Secondary | ICD-10-CM | POA: Diagnosis not present

## 2021-06-28 DIAGNOSIS — H2513 Age-related nuclear cataract, bilateral: Secondary | ICD-10-CM | POA: Diagnosis not present

## 2021-06-28 DIAGNOSIS — H35033 Hypertensive retinopathy, bilateral: Secondary | ICD-10-CM | POA: Diagnosis not present

## 2021-07-12 ENCOUNTER — Encounter (HOSPITAL_BASED_OUTPATIENT_CLINIC_OR_DEPARTMENT_OTHER): Payer: Self-pay | Admitting: Nurse Practitioner

## 2021-07-12 ENCOUNTER — Ambulatory Visit (INDEPENDENT_AMBULATORY_CARE_PROVIDER_SITE_OTHER): Payer: Medicare Other | Admitting: Nurse Practitioner

## 2021-07-12 ENCOUNTER — Other Ambulatory Visit: Payer: Self-pay

## 2021-07-12 VITALS — BP 130/100 | HR 88 | Ht 61.0 in | Wt 164.1 lb

## 2021-07-12 DIAGNOSIS — I10 Essential (primary) hypertension: Secondary | ICD-10-CM

## 2021-07-12 DIAGNOSIS — F411 Generalized anxiety disorder: Secondary | ICD-10-CM | POA: Diagnosis not present

## 2021-07-12 DIAGNOSIS — F321 Major depressive disorder, single episode, moderate: Secondary | ICD-10-CM | POA: Diagnosis not present

## 2021-07-12 MED ORDER — SERTRALINE HCL 50 MG PO TABS: ORAL_TABLET | ORAL | 3 refills | Status: DC

## 2021-07-12 MED ORDER — HYDROCHLOROTHIAZIDE 25 MG PO TABS: 25.0000 mg | ORAL_TABLET | Freq: Every day | ORAL | 3 refills | Status: DC

## 2021-07-12 NOTE — Patient Instructions (Addendum)
Look up  "Progressive Muscle Relaxation" on YouTube and try this before bed every night. This helps reduce stress and promotes restful sleep  I want you to keep a small journal and before bed every night write down 3 good things that happened that day. This can be as simple as "I saw a friend", but just find 3 (or more) good things from that day. This helps retrain our brain to see positives.   I sent in sertraline 50mg  for anxiety. You take this at bedtime every night. It will take about a week before you start to notice any changes.  Start with 1/2 tab for 4 nights then increase to a full tab.    I sent in hydrochlorothiazide (HCTZ) for blood pressure. You can take this any time of day, but take it at the same time every day. It should start working in the first few days.   I want you to keep a log of your blood pressures once a day (or more) for the next 2 weeks and we will see how they are doing with a phone call in 2 weeks.   1000mg  Fish Oil is a good dose daily for this supplement

## 2021-07-12 NOTE — Assessment & Plan Note (Signed)
See GAD note from today

## 2021-07-12 NOTE — Assessment & Plan Note (Signed)
Elevated BP readings at home and in office. Repeat readings in office remain elevated.  Education provided on effect of elevated BP on the CV system and risks associated with not managing appropriately.  Patient appears hesitant to start a new daily medication, but after stressing the vital importance of control, she agrees to trial low dose HCTZ.  We will check labs today and start 25mg  HCTZ for BP. She will monitor her BP at home and report her readings at f/u I strongly feel that her elevated BP is somewhat related to her anxiety and depression levels at this time. Discussed with patient that we can evaluate stopping the medication if her BP is normal once we have her mood stabilized. She is in agreement to this.

## 2021-07-12 NOTE — Progress Notes (Signed)
Acute Office Visit  Subjective:    Patient ID: Heather Brown, female    DOB: [birth year], 75 y.o.   MRN: [id number]  Chief Complaint  Patient presents with   Acute Visit    Patient called in yesterday concerned elevated BP readings at home and headaches. When she came in the office today, she was very emotional and crying. Due to lots of stress. 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 Along with the anniversary of her daughter overdose death with 5 children left behind.     HPI Patient is in today for elevated blood pressure and headaches.   BP Blood pressures have been elevated at home with readings of 151/96, 146/90, 166/87 for the last three mornings. She has also been having intermittent headaches that come and go. She denies severity with headaches, vision changes, palpitations, chest pain, shortness of breath, or edema.  She is not currently taking any medication for BP Her thyroid levels were checked about 2 months ago and were normal at that time. She has not missed any doses of her medication and reports the medication brand has not changed.  She does endorses starting to drink coffee again after not having any for several years, but she is only having a cup in the morning. She also reports she is under a tremendous amount of stress and has been having difficulty finding energy and motivation lately. She endorses difficulty falling asleep at night due to racing thoughts. She has no thoughts of self harm, but reports she feels very overwhelmed at this time.  She was on a PRN medication in the 70's, but cannot remember the name. She knows that the medication made her feel very tired and "just awful". She does not want anything that will make her tired. She feels her anxiety is exacerbated with the upcoming holidays, the 4th anniversary of the overdose of her daughter, caring for her husband who is ill, and worrying about her brother in law who recently had an  accident that resulted in a one month hospital stay.  She tells me she feels anxious and worried all the time and has found herself being short tempered and hateful, "which is not like me".    Past Medical History:  Diagnosis Date   Dyspareunia    dryness with intercourse   Endometriosis 1982   found at [facility]   GERD (gastroesophageal reflux disease)    Heart murmur    History of left shoulder fracture 2017   Hypothyroidism    Left ACL tear    Osteopenia 2009   Pneumonia    Polycythemia vera ([place]) 12/02/2017   PONV (postoperative nausea and vomiting)    Thyroid nodule    followed by Dr Chalmers Cater   Urinary incontinence    with coughing/sneezing    Past Surgical History:  Procedure Laterality Date   ABDOMINAL HYSTERECTOMY  07/30/1980   [facility]--for abnormal paps/endometriosis   AUGMENTATION MAMMAPLASTY  07/31/1975   BREAST IMPLANT EXCHANGE  10/2020   COLONOSCOPY WITH PROPOFOL N/A 10/29/2013   Procedure: COLONOSCOPY WITH PROPOFOL;  Surgeon: Jeryl Columbia, MD;  Location: [facility] ENDOSCOPY;  Service: Endoscopy;  Laterality: N/A;   COLONOSCOPY WITH PROPOFOL N/A 12/22/2014   Procedure: COLONOSCOPY WITH PROPOFOL;  Surgeon: Clarene Essex, MD;  Location: [facility] ENDOSCOPY;  Service: Endoscopy;  Laterality: N/A;  ultra slim scope   COSMETIC SURGERY  11/28/2011   face lift,neck lift, eye lift--Dr. Stephanie Coup   dilatation and curettage  07/30/1980  HOT HEMOSTASIS N/A 12/22/2014   Procedure: HOT HEMOSTASIS (ARGON PLASMA COAGULATION/BICAP);  Surgeon: Clarene Essex, MD;  Location: [facility] ENDOSCOPY;  Service: Endoscopy;  Laterality: N/A;   TONSILLECTOMY      Family History  Problem Relation Age of Onset   Cancer Mother    Cervical cancer Mother    Diabetes Father        adult onset   Stroke Father    Thyroid disease Sister        hyperthyroid   Asthma Sister    Thyroid disease Sister        hypothyroid   Cancer Maternal Grandmother    Stomach cancer Maternal Grandmother     Social History   Socioeconomic  History   Marital status: Married    Spouse name: Not on file   Number of children: Not on file   Years of education: Not on file   Highest education level: Not on file  Occupational History   Not on file  Tobacco Use   Smoking status: Never   Smokeless tobacco: Never  Vaping Use   Vaping Use: Never used  Substance and Sexual Activity   Alcohol use: Not Currently    Alcohol/week: 1.0 standard drink    Types: 1 Glasses of wine per week    Comment: Once every two months   Drug use: No   Sexual activity: Yes    Partners: Male    Birth control/protection: Surgical    Comment: [facility]  Other Topics Concern   Not on file  Social History Narrative   Not on file   Social Determinants of Health   Financial Resource Strain: Not on file  Food Insecurity: Not on file  Transportation Needs: Not on file  Physical Activity: Not on file  Stress: Not on file  Social Connections: Not on file  Intimate Partner Violence: Not on file    Outpatient Medications Prior to Visit  Medication Sig Dispense Refill   aspirin 81 MG tablet Take 81 mg by mouth daily.     Biotin 2500 MCG CAPS Take 1 tablet by mouth daily.     CALCIUM CARBONATE ANTACID PO Take 1,000 mg by mouth daily as needed for indigestion or heartburn.      Cholecalciferol (VITAMIN D3) 1000 UNITS CAPS Take 1,000 Units by mouth daily.     fluticasone (FLONASE) 50 MCG/ACT nasal spray Place 2 sprays into both nostrils daily. 16 g 6   levothyroxine (SYNTHROID) 75 MCG tablet 75 mcg daily.     mometasone (ELOCON) 0.1 % cream Apply 1 application topically daily. 45 g 0   Multiple Vitamin (MULTIVITAMIN) capsule Take 1 capsule by mouth daily.     Nutritional Supplements (JOINT FORMULA PO) Take 1 capsule by mouth 1 day or 1 dose.     tretinoin (RETIN-A) 0.05 % cream Apply pea sized amount to affected area of face nightly as needed.  5   loratadine (CLARITIN) 10 MG tablet Take 10 mg by mouth daily as needed for allergies.      No  facility-administered medications prior to visit.    Allergies  Allergen Reactions   Sulfa Antibiotics Hives   Cephalexin Rash    Face turned bright red.    Review of Systems All review of systems negative except what is listed in the HPI     Objective:    Physical Exam Vitals and nursing note reviewed.  Constitutional:      General: She is not in acute distress.  Appearance: Normal appearance.  HENT:     Head: Normocephalic.     Right Ear: Hearing normal.     Left Ear: Hearing normal.     Mouth/Throat:     Lips: Pink.  Eyes:     General: Lids are normal. Vision grossly intact.     Extraocular Movements: Extraocular movements intact.     Conjunctiva/sclera: Conjunctivae normal.     Funduscopic exam:    Right eye: No hemorrhage. Red reflex present.        Left eye: No hemorrhage. Red reflex present.    Visual Fields: Right eye visual fields normal and left eye visual fields normal.  Neck:     Thyroid: No thyromegaly.     Vascular: No carotid bruit or JVD.  Cardiovascular:     Rate and Rhythm: Normal rate and regular rhythm.     Chest Wall: PMI is not displaced.     Pulses: Normal pulses.     Heart sounds: Normal heart sounds.  Pulmonary:     Effort: Pulmonary effort is normal.     Breath sounds: Normal breath sounds.  Abdominal:     General: Abdomen is flat. Bowel sounds are normal.     Palpations: Abdomen is soft.  Musculoskeletal:        General: Normal range of motion.     Cervical back: Normal range of motion. No tenderness.     Right lower leg: No edema.     Left lower leg: No edema.  Lymphadenopathy:     Cervical: No cervical adenopathy.  Skin:    General: Skin is warm and dry.     Capillary Refill: Capillary refill takes less than 2 seconds.  Neurological:     General: No focal deficit present.     Mental Status: She is alert and oriented to person, place, and time.     Motor: No weakness.     Gait: Gait normal.  Psychiatric:        Mood and  Affect: Mood is anxious. Affect is tearful.        Behavior: Behavior normal.        Thought Content: Thought content normal.        Judgment: Judgment normal.    BP (!) 130/100    Pulse 88    Ht 5\' 1"  (1.549 m)    Wt 164 lb 1.6 oz (74.4 kg)    LMP 07/30/1980    SpO2 (!) 58%    BMI 31.01 kg/m  Wt Readings from Last 3 Encounters:  07/12/21 164 lb 1.6 oz (74.4 kg)  03/02/21 164 lb 12.8 oz (74.8 kg)  02/22/21 163 lb (73.9 kg)    Health Maintenance Due  Topic Date Due   Pneumonia Vaccine 82+ Years old (1 - PCV) Never done   Hepatitis C Screening  Never done   Zoster Vaccines- Shingrix (1 of 2) Never done   COVID-19 Vaccine (4 - Booster for Pfizer series) 07/25/2020    There are no preventive care reminders to display for this patient.   Lab Results  Component Value Date   TSH 1.540 03/02/2021   Lab Results  Component Value Date   WBC 3.4 (L) 02/22/2021   HGB 12.4 02/22/2021   HCT 41.1 02/22/2021   MCV 78.1 (L) 02/22/2021   PLT 271 02/22/2021   Lab Results  Component Value Date   NA 141 02/22/2021   K 4.5 02/22/2021   CO2 29 02/22/2021   GLUCOSE 79 02/22/2021  BUN 19 02/22/2021   CREATININE 0.80 02/22/2021   BILITOT 0.8 02/22/2021   ALKPHOS 119 02/22/2021   AST 22 02/22/2021   ALT 25 02/22/2021   PROT 6.7 02/22/2021   ALBUMIN 4.0 02/22/2021   CALCIUM 10.8 (H) 02/22/2021   ANIONGAP 5 02/22/2021   Lab Results  Component Value Date   CHOL 149 03/02/2021   Lab Results  Component Value Date   HDL 41 03/02/2021   Lab Results  Component Value Date   LDLCALC 86 03/02/2021   Lab Results  Component Value Date   TRIG 120 03/02/2021   Lab Results  Component Value Date   CHOLHDL 3.6 03/02/2021   No results found for: HGBA1C     Assessment & Plan:   Problem List Items Addressed This Visit     Generalized anxiety disorder - Primary    Significant depression and anxiety symptoms present today No alarm signs present She is interested in starting  medication today, but hesitant to have to take anything long term.  Discussed the effect shorter days have on mental health coupled with her stressors right now.  Recommend starting low dose sertraline for mood and hydroxyzine PRN for sleep both at bedtime.  Will montior to see how she is doing with a phone visit in 2 weeks and evaluate the need to increase the medication at that time.  Discussed that we may be able to taper off of the medication in the Spring if she is doing well.       Relevant Medications   sertraline (ZOLOFT) 50 MG tablet   hydrochlorothiazide (HYDRODIURIL) 25 MG tablet   Other Relevant Orders   CBC with Differential/Platelet   Comprehensive metabolic panel   TSH   Primary hypertension    Elevated BP readings at home and in office. Repeat readings in office remain elevated.  Education provided on effect of elevated BP on the CV system and risks associated with not managing appropriately.  Patient appears hesitant to start a new daily medication, but after stressing the vital importance of control, she agrees to trial low dose HCTZ.  We will check labs today and start 25mg  HCTZ for BP. She will monitor her BP at home and report her readings at f/u I strongly feel that her elevated BP is somewhat related to her anxiety and depression levels at this time. Discussed with patient that we can evaluate stopping the medication if her BP is normal once we have her mood stabilized. She is in agreement to this.       Relevant Medications   sertraline (ZOLOFT) 50 MG tablet   hydrochlorothiazide (HYDRODIURIL) 25 MG tablet   Other Relevant Orders   CBC with Differential/Platelet   Comprehensive metabolic panel   TSH   Depression, major, single episode, moderate (HCC)    See GAD note from today      Relevant Medications   sertraline (ZOLOFT) 50 MG tablet     Meds ordered this encounter  Medications   sertraline (ZOLOFT) 50 MG tablet    Sig: Sart with 1/2 tab (25mg ) for  4 nights then increase to 1 tab (50mg ) nightly.    Dispense:  30 tablet    Refill:  3   hydrochlorothiazide (HYDRODIURIL) 25 MG tablet    Sig: Take 1 tablet (25 mg total) by mouth daily.    Dispense:  30 tablet    Refill:  3   Return in about 2 weeks (around 07/26/2021) for BP and Anxiety- Virtual Visit.  Orma Render, NP

## 2021-07-12 NOTE — Assessment & Plan Note (Addendum)
Significant depression and anxiety symptoms present today No alarm signs present She is interested in starting medication today, but hesitant to have to take anything long term.  Discussed the effect shorter days have on mental health coupled with her stressors right now.  Recommend starting low dose sertraline for mood and hydroxyzine PRN for sleep both at bedtime.  Will montior to see how she is doing with a phone visit in 2 weeks and evaluate the need to increase the medication at that time.  Discussed that we may be able to taper off of the medication in the Spring if she is doing well.

## 2021-07-13 LAB — COMPREHENSIVE METABOLIC PANEL
ALT: 27 IU/L (ref 0–32)
AST: 25 IU/L (ref 0–40)
Albumin/Globulin Ratio: 1.6 (ref 1.2–2.2)
Albumin: 4.2 g/dL (ref 3.7–4.7)
Alkaline Phosphatase: 147 IU/L — ABNORMAL HIGH (ref 44–121)
BUN/Creatinine Ratio: 17 (ref 12–28)
BUN: 14 mg/dL (ref 8–27)
Bilirubin Total: 1 mg/dL (ref 0.0–1.2)
CO2: 24 mmol/L (ref 20–29)
Calcium: 10.7 mg/dL — ABNORMAL HIGH (ref 8.7–10.3)
Chloride: 104 mmol/L (ref 96–106)
Creatinine, Ser: 0.82 mg/dL (ref 0.57–1.00)
Globulin, Total: 2.6 g/dL (ref 1.5–4.5)
Glucose: 91 mg/dL (ref 70–99)
Potassium: 4.4 mmol/L (ref 3.5–5.2)
Sodium: 140 mmol/L (ref 134–144)
Total Protein: 6.8 g/dL (ref 6.0–8.5)
eGFR: 74 mL/min/{1.73_m2} (ref >59)

## 2021-07-13 LAB — CBC WITH DIFFERENTIAL/PLATELET
Basophils Absolute: 0.1 10*3/uL (ref 0.0–0.2)
Basos: 1 %
EOS (ABSOLUTE): 0.1 10*3/uL (ref 0.0–0.4)
Eos: 3 %
Hematocrit: 43.6 % (ref 34.0–46.6)
Hemoglobin: 13.2 g/dL (ref 11.1–15.9)
Immature Grans (Abs): 0 10*3/uL (ref 0.0–0.1)
Immature Granulocytes: 0 %
Lymphocytes Absolute: 1.2 10*3/uL (ref 0.7–3.1)
Lymphs: 31 %
MCH: 23.3 pg — ABNORMAL LOW (ref 26.6–33.0)
MCHC: 30.3 g/dL — ABNORMAL LOW (ref 31.5–35.7)
MCV: 77 fL — ABNORMAL LOW (ref 79–97)
Monocytes Absolute: 0.6 10*3/uL (ref 0.1–0.9)
Monocytes: 15 %
Neutrophils Absolute: 1.9 10*3/uL (ref 1.4–7.0)
Neutrophils: 50 %
Platelets: 329 10*3/uL (ref 150–450)
RBC: 5.66 x10E6/uL — ABNORMAL HIGH (ref 3.77–5.28)
RDW: 14.9 % (ref 11.7–15.4)
WBC: 3.8 10*3/uL (ref 3.4–10.8)

## 2021-07-13 LAB — TSH: TSH: 2.72 u[IU]/mL (ref 0.450–4.500)

## 2021-07-14 ENCOUNTER — Telehealth: Payer: Self-pay | Admitting: *Deleted

## 2021-07-14 NOTE — Telephone Encounter (Signed)
Call received from patient wanting to know if her diagnosis of polycythemia vera would cause her BP to be elevated.  Pt notified per order of S. Eulas Post NP that her diagnosis is not a definite indicator of causing elevated BP and that it could be elevated if her HGB is high, which it is not.  Pt is appreciative of call back and has no further questions at this time.

## 2021-07-27 ENCOUNTER — Telehealth (HOSPITAL_BASED_OUTPATIENT_CLINIC_OR_DEPARTMENT_OTHER): Payer: Medicare Other | Admitting: Nurse Practitioner

## 2021-08-23 ENCOUNTER — Other Ambulatory Visit: Payer: Self-pay

## 2021-08-23 ENCOUNTER — Inpatient Hospital Stay: Payer: Medicare Other | Attending: Hematology & Oncology

## 2021-08-23 ENCOUNTER — Encounter: Payer: Self-pay | Admitting: Hematology & Oncology

## 2021-08-23 ENCOUNTER — Inpatient Hospital Stay: Payer: Medicare Other

## 2021-08-23 ENCOUNTER — Inpatient Hospital Stay: Payer: Medicare Other | Admitting: Hematology & Oncology

## 2021-08-23 VITALS — BP 123/100 | HR 78 | Temp 98.4°F | Resp 20 | Wt 164.1 lb

## 2021-08-23 DIAGNOSIS — D45 Polycythemia vera: Secondary | ICD-10-CM | POA: Insufficient documentation

## 2021-08-23 DIAGNOSIS — Z7982 Long term (current) use of aspirin: Secondary | ICD-10-CM | POA: Diagnosis not present

## 2021-08-23 DIAGNOSIS — Z79899 Other long term (current) drug therapy: Secondary | ICD-10-CM | POA: Diagnosis not present

## 2021-08-23 DIAGNOSIS — R519 Headache, unspecified: Secondary | ICD-10-CM | POA: Insufficient documentation

## 2021-08-23 LAB — CBC WITH DIFFERENTIAL (CANCER CENTER ONLY)
Abs Immature Granulocytes: 0.01 10*3/uL (ref 0.00–0.07)
Basophils Absolute: 0.1 10*3/uL (ref 0.0–0.1)
Basophils Relative: 1 %
Eosinophils Absolute: 0.1 10*3/uL (ref 0.0–0.5)
Eosinophils Relative: 3 %
HCT: 41.7 % (ref 36.0–46.0)
Hemoglobin: 12.7 g/dL (ref 12.0–15.0)
Immature Granulocytes: 0 %
Lymphocytes Relative: 32 %
Lymphs Abs: 1.2 10*3/uL (ref 0.7–4.0)
MCH: 23.3 pg — ABNORMAL LOW (ref 26.0–34.0)
MCHC: 30.5 g/dL (ref 30.0–36.0)
MCV: 76.7 fL — ABNORMAL LOW (ref 80.0–100.0)
Monocytes Absolute: 0.5 10*3/uL (ref 0.1–1.0)
Monocytes Relative: 14 %
Neutro Abs: 1.8 10*3/uL (ref 1.7–7.7)
Neutrophils Relative %: 50 %
Platelet Count: 316 10*3/uL (ref 150–400)
RBC: 5.44 MIL/uL — ABNORMAL HIGH (ref 3.87–5.11)
RDW: 16 % — ABNORMAL HIGH (ref 11.5–15.5)
WBC Count: 3.7 10*3/uL — ABNORMAL LOW (ref 4.0–10.5)
nRBC: 0 % (ref 0.0–0.2)

## 2021-08-23 LAB — CMP (CANCER CENTER ONLY)
ALT: 26 U/L (ref 0–44)
AST: 21 U/L (ref 15–41)
Albumin: 4.2 g/dL (ref 3.5–5.0)
Alkaline Phosphatase: 120 U/L (ref 38–126)
Anion gap: 6 (ref 5–15)
BUN: 21 mg/dL (ref 8–23)
CO2: 28 mmol/L (ref 22–32)
Calcium: 10.7 mg/dL — ABNORMAL HIGH (ref 8.9–10.3)
Chloride: 106 mmol/L (ref 98–111)
Creatinine: 0.7 mg/dL (ref 0.44–1.00)
GFR, Estimated: >60 mL/min (ref >60)
Glucose, Bld: 123 mg/dL — ABNORMAL HIGH (ref 70–99)
Potassium: 3.9 mmol/L (ref 3.5–5.1)
Sodium: 140 mmol/L (ref 135–145)
Total Bilirubin: 1 mg/dL (ref 0.3–1.2)
Total Protein: 6.7 g/dL (ref 6.5–8.1)

## 2021-08-23 LAB — IRON AND IRON BINDING CAPACITY (CC-WL,HP ONLY)
Iron: 58 ug/dL (ref 28–170)
Saturation Ratios: 11 % (ref 10.4–31.8)
TIBC: 522 ug/dL — ABNORMAL HIGH (ref 250–450)
UIBC: 464 ug/dL — ABNORMAL HIGH (ref 148–442)

## 2021-08-23 LAB — FERRITIN: Ferritin: 8 ng/mL — ABNORMAL LOW (ref 11–307)

## 2021-08-23 MED ORDER — AMLODIPINE BESY-BENAZEPRIL HCL 5-20 MG PO CAPS: 1.0000 | ORAL_CAPSULE | Freq: Every day | ORAL | 3 refills | Status: DC

## 2021-08-23 NOTE — Progress Notes (Signed)
Hematology and Oncology Follow Up Visit  Heather Brown [id number] 1946-04-04 76 y.o. 08/23/2021   Principle Diagnosis:  Polycythemia vera -- DNMT3A (+),  Epo = 5.7  Current Therapy:   Phlebotomy to maintain hematocrit below 45% Aspirin 81 mg p.o. daily     Interim History:  Heather Brown is back for follow-up.  Her main problem right now is her blood pressure.  She was put on hydrochlorothiazide.  She says that she urinates quite a bit.  Her blood pressure today is 123/100.  She still feels tired.  She still has little bit of a headache.  I will go ahead and try her on some Lotrel (5/20) that she will take daily.  I told her to follow-up with her family doctor in a few weeks to have her blood pressure checked and to see if the family doctor agrees with the Lotrel.  Her husband is having problems.  He has heart failure.  She has to do everything for him.  Heather Brown certainly is quite good and quite dedicated.  She did manage to enjoy the holiday season.  She has had no problems with cough or shortness of breath.  There is been no issues with COVID.    Her last iron studies back in July showed a ferritin of 5 and iron saturation of 11%.  Currently, I would say performance status is probably ECOG 1.   Medications:  Current Outpatient Medications:    aspirin 81 MG tablet, Take 81 mg by mouth daily., Disp: , Rfl:    Biotin 2500 MCG CAPS, Take 1 tablet by mouth daily., Disp: , Rfl:    CALCIUM CARBONATE ANTACID PO, Take 1,000 mg by mouth daily as needed for indigestion or heartburn., Disp: , Rfl:    Cholecalciferol (VITAMIN D3) 1000 UNITS CAPS, Take 1,000 Units by mouth daily., Disp: , Rfl:    fluticasone (FLONASE) 50 MCG/ACT nasal spray, Place 2 sprays into both nostrils daily., Disp: 16 g, Rfl: 6   levothyroxine (SYNTHROID) 75 MCG tablet, 75 mcg daily., Disp: , Rfl:    mometasone (ELOCON) 0.1 % cream, Apply 1 application topically daily., Disp: 45 g, Rfl: 0   Multiple Vitamin  (MULTIVITAMIN) capsule, Take 1 capsule by mouth daily., Disp: , Rfl:    Nutritional Supplements (JOINT FORMULA PO), Take 1 capsule by mouth 1 day or 1 dose., Disp: , Rfl:    sertraline (ZOLOFT) 50 MG tablet, Sart with 1/2 tab (25mg ) for 4 nights then increase to 1 tab (50mg ) nightly., Disp: 30 tablet, Rfl: 3   tretinoin (RETIN-A) 0.05 % cream, Apply pea sized amount to affected area of face nightly as needed., Disp: , Rfl: 5   hydrochlorothiazide (HYDRODIURIL) 25 MG tablet, Take 1 tablet (25 mg total) by mouth daily. (Patient not taking: Reported on 08/23/2021), Disp: 30 tablet, Rfl: 3  Allergies:  Allergies  Allergen Reactions   Sulfa Antibiotics Hives   Cephalexin Rash    Face turned bright red.    Past Medical History, Surgical history, Social history, and Family History were reviewed and updated.  Review of Systems: Review of Systems  HENT:  Negative.    Eyes: Negative.   Respiratory:  Positive for shortness of breath.   Cardiovascular: Negative.   Gastrointestinal:  Positive for abdominal distention.  Endocrine: Negative.   Musculoskeletal:  Positive for arthralgias, flank pain, myalgias and neck stiffness.  Skin: Negative.   Neurological: Negative.   Hematological: Negative.   Psychiatric/Behavioral: Negative.     Physical  Exam:  weight is 164 lb 1.9 oz (74.4 kg). Her oral temperature is 98.4 F (36.9 C). Her blood pressure is 123/100 (abnormal) and her pulse is 78. Her respiration is 20 and oxygen saturation is 95%.   Wt Readings from Last 3 Encounters:  08/23/21 164 lb 1.9 oz (74.4 kg)  07/12/21 164 lb 1.6 oz (74.4 kg)  03/02/21 164 lb 12.8 oz (74.8 kg)    Physical Exam Vitals reviewed.  HENT:     Head: Normocephalic and atraumatic.  Eyes:     Pupils: Pupils are equal, round, and reactive to light.  Cardiovascular:     Rate and Rhythm: Normal rate and regular rhythm.     Heart sounds: Normal heart sounds.  Pulmonary:     Effort: Pulmonary effort is normal.      Breath sounds: Normal breath sounds.  Abdominal:     General: Bowel sounds are normal.     Palpations: Abdomen is soft.  Musculoskeletal:        General: No tenderness or deformity. Normal range of motion.     Cervical back: Normal range of motion.     Comments: Her big toe on her right foot is a little swollen.  There is some erythema associated with this.  She does have decent range of motion.  There is no tenderness to palpation over the area of erythema.  Lymphadenopathy:     Cervical: No cervical adenopathy.  Skin:    General: Skin is warm and dry.     Findings: No erythema or rash.  Neurological:     Mental Status: She is alert and oriented to person, place, and time.  Psychiatric:        Behavior: Behavior normal.        Thought Content: Thought content normal.        Judgment: Judgment normal.     Lab Results  Component Value Date   WBC 3.7 (L) 08/23/2021   HGB 12.7 08/23/2021   HCT 41.7 08/23/2021   MCV 76.7 (L) 08/23/2021   PLT 316 08/23/2021     Chemistry      Component Value Date/Time   NA 140 08/23/2021 1009   NA 140 07/12/2021 0951   K 3.9 08/23/2021 1009   CL 106 08/23/2021 1009   CO2 28 08/23/2021 1009   BUN 21 08/23/2021 1009   BUN 14 07/12/2021 0951   CREATININE 0.70 08/23/2021 1009   CREATININE 0.79 03/10/2014 0836      Component Value Date/Time   CALCIUM 10.7 (H) 08/23/2021 1009   ALKPHOS 120 08/23/2021 1009   AST 21 08/23/2021 1009   ALT 26 08/23/2021 1009   BILITOT 1.0 08/23/2021 1009       Impression and Plan: Heather Brown is a 76 year old white female.  She has polycythemia vera.  Are you doing with her as phlebotomizing her.  Her hemoglobin is not too high such that that would cause a problem with her blood pressure.  I do not see any problems with putting her on Lotrel.  I think this would be a good choice for her.  We will see what her iron studies show.  I am sure that they are still quite low.  We will plan to get her back in  about 3 or 4 months.  I does want to make sure we follow her little bit more closely.    [name], MD 1/25/202311:47 AM

## 2021-08-31 ENCOUNTER — Ambulatory Visit (HOSPITAL_BASED_OUTPATIENT_CLINIC_OR_DEPARTMENT_OTHER): Payer: Medicare Other | Admitting: Nurse Practitioner

## 2021-09-06 ENCOUNTER — Telehealth (HOSPITAL_BASED_OUTPATIENT_CLINIC_OR_DEPARTMENT_OTHER): Payer: Medicare Other | Admitting: Nurse Practitioner

## 2021-09-11 ENCOUNTER — Ambulatory Visit (INDEPENDENT_AMBULATORY_CARE_PROVIDER_SITE_OTHER): Payer: Medicare Other | Admitting: Nurse Practitioner

## 2021-09-11 ENCOUNTER — Other Ambulatory Visit: Payer: Self-pay

## 2021-09-11 ENCOUNTER — Encounter (HOSPITAL_BASED_OUTPATIENT_CLINIC_OR_DEPARTMENT_OTHER): Payer: Self-pay | Admitting: Nurse Practitioner

## 2021-09-11 VITALS — BP 132/82 | HR 68

## 2021-09-11 DIAGNOSIS — R052 Subacute cough: Secondary | ICD-10-CM | POA: Diagnosis not present

## 2021-09-11 DIAGNOSIS — Z0000 Encounter for general adult medical examination without abnormal findings: Secondary | ICD-10-CM

## 2021-09-11 HISTORY — DX: Subacute cough: R05.2

## 2021-09-11 MED ORDER — BENZONATATE 200 MG PO CAPS: 200.0000 mg | ORAL_CAPSULE | Freq: Three times a day (TID) | ORAL | 1 refills | Status: DC | PRN

## 2021-09-11 MED ORDER — AZITHROMYCIN 250 MG PO TABS: ORAL_TABLET | ORAL | 0 refills | Status: DC

## 2021-09-11 NOTE — Patient Instructions (Signed)
°  [place] Maintenance Summary and Written Plan of Care  Ms. Heather Brown ,  Thank you for allowing me to perform your Medicare Annual Wellness Visit and for your ongoing commitment to your health.   Health Maintenance & Immunization History Health Maintenance  Topic Date Due   Zoster Vaccines- Shingrix (1 of 2) Never done   Pneumonia Vaccine 67+ Years old (1 - PCV) Never done   COVID-19 Vaccine (4 - Booster for Pfizer series) 09/27/2021 (Originally 07/25/2020)   TETANUS/TDAP  03/12/2023   INFLUENZA VACCINE  Completed   DEXA SCAN  Completed   HPV VACCINES  Aged Out   COLONOSCOPY (Pts 45-11yrs Insurance coverage will need to be confirmed)  Discontinued   Hepatitis C Screening  Discontinued   Immunization History  Administered Date(s) Administered   Influenza, High Dose Seasonal PF 07/17/2016   Influenza-Unspecified 04/29/2021   PFIZER(Purple Top)SARS-COV-2 Vaccination 09/06/2019, 10/01/2019, 05/30/2020   Tdap 03/11/2013    These are the patient goals that we discussed:  Goals Addressed             This Visit's Progress    Activity and Exercise Increased       Evidence-based guidance:  Review current exercise levels.  Assess patient perspective on exercise or activity level, barriers to increasing activity, motivation and readiness for change.  Recommend or set healthy exercise goal based on individual tolerance.  Encourage small steps toward making change in amount of exercise or activity.  Urge reduction of sedentary activities or screen time.  Promote group activities within the community or with family or support person.  Consider referral to rehabiliation therapist for assessment and exercise/activity plan.   Notes:  Patient states she would like to walk more.          This is a list of Health Maintenance Items that are overdue or due now: Health Maintenance Due  Topic Date Due   Zoster Vaccines- Shingrix (1 of 2) Never done    Pneumonia Vaccine 72+ Years old (1 - PCV) Never done     Orders/Referrals Placed Today: No orders of the defined types were placed in this encounter.  (Contact our referral department at [phone] if you have not spoken with someone about your referral appointment within the next 5 days)    Follow-up Plan Next few weeks for ear wax removal   Worthy Keeler, DNP, AGNP-c

## 2021-09-11 NOTE — Progress Notes (Signed)
[facility] [place]  at [place]   [place], [place]   [address] [employer] VISIT  09/11/2021  Telephone Visit Disclaimer This Medicare AWV was conducted by telephone due to national recommendations for restrictions regarding the COVID-19 Pandemic (e.g. social distancing).  I verified, using two identifiers, that I am speaking with Heather Brown or their authorized healthcare agent. I discussed the limitations, risks, security, and privacy concerns of performing an evaluation and management service by telephone and the potential availability of an in-person appointment in the future. The patient expressed understanding and agreed to proceed.  Location of Patient: home  Location of Provider (nurse):  Office  Subjective:    Heather Brown is a 76 y.o. female patient of Kajol Crispen, Coralee Pesa, NP who had a Medicare Annual Wellness Visit today via telephone. Heather Brown is Retired and lives with their spouse. she has 2 children and 6 grandchildren.  she reports that she is socially active and does interact with friends/family regularly. she is minimally physically active and enjoys reading.  Patient Care Team: Orma Render, NP as PCP - General (Nurse Practitioner)  Advanced Directives 09/11/2021 08/23/2021 02/22/2021 02/08/2021 08/24/2020 05/24/2020 03/23/2020  Does Patient Have a Medical Advance Directive? Yes Yes Yes Yes Yes Yes Yes  Type of Paramedic of [place];Living will [place];Living will Living will;Healthcare Power of [place];Living will [place];Living will [place];Living will  Does patient want to make changes to medical advance directive? Yes (Inpatient - patient requests chaplain consult to change a medical advance directive) - No -  Patient declined Yes (Inpatient - patient defers changing a medical advance directive and declines information at this time) - - No - Patient declined  Copy of [place] in Chart? - - - - - No - copy requested No - copy requested    Hospital Utilization Over the Past 12 Months: # of hospitalizations or ER visits: 0 # of surgeries: 0  Review of Systems    Patient reports that her overall health is better compared to last year. Overall health 8/10.  Negative except cough and increased mucous production  Patient Reported Readings (BP, Pulse, CBG, Weight, etc) 132/82  Pain Assessment Pain : No/denies pain Pain Score: 0-No pain0/10     Current Medications & Allergies (verified) Allergies as of 09/11/2021       Reactions   Sulfa Antibiotics Hives   Cephalexin Rash   Face turned bright red.        Medication List        Accurate as of September 11, 2021  2:57 PM. If you have any questions, ask your nurse or doctor.          STOP taking these medications    hydrochlorothiazide 25 MG tablet Commonly known as: HYDRODIURIL Stopped by: Orma Render, NP       TAKE these medications    amLODipine-benazepril 5-20 MG capsule Commonly known as: Lotrel Take 1 capsule by mouth daily.   aspirin 81 MG tablet Take 81 mg by mouth daily.   Biotin 2500 MCG Caps Take 1 tablet by mouth daily.   CALCIUM CARBONATE ANTACID PO Take 1,000 mg by mouth daily as needed for indigestion or heartburn.   fluticasone 50 MCG/ACT nasal spray Commonly known as: FLONASE Place 2 sprays into both  nostrils daily.   JOINT FORMULA PO Take 1 capsule by mouth 1 day or 1 dose.   levothyroxine 75 MCG tablet Commonly known as: SYNTHROID 75 mcg daily.   mometasone 0.1 % cream Commonly known as: Elocon Apply 1 application topically daily.   multivitamin capsule Take 1 capsule by mouth daily.   sertraline 50 MG tablet Commonly known as: ZOLOFT Sart with 1/2 tab (25mg )  for 4 nights then increase to 1 tab (50mg ) nightly.   tretinoin 0.05 % cream Commonly known as: RETIN-A Apply pea sized amount to affected area of face nightly as needed.   Vitamin D3 25 MCG (1000 UT) Caps Take 1,000 Units by mouth daily.        History (reviewed): Past Medical History:  Diagnosis Date   Dyspareunia    dryness with intercourse   Endometriosis 1982   found at [facility]   GERD (gastroesophageal reflux disease)    Heart murmur    History of left shoulder fracture 2017   Hypothyroidism    Left ACL tear    Osteopenia 2009   Pneumonia    Polycythemia vera ([place]) 12/02/2017   PONV (postoperative nausea and vomiting)    Thyroid nodule    followed by Dr Chalmers Cater   Urinary incontinence    with coughing/sneezing   Past Surgical History:  Procedure Laterality Date   ABDOMINAL HYSTERECTOMY  07/30/1980   [facility]--for abnormal paps/endometriosis   AUGMENTATION MAMMAPLASTY  07/31/1975   BREAST IMPLANT EXCHANGE  10/2020   COLONOSCOPY WITH PROPOFOL N/A 10/29/2013   Procedure: COLONOSCOPY WITH PROPOFOL;  Surgeon: Jeryl Columbia, MD;  Location: [facility] ENDOSCOPY;  Service: Endoscopy;  Laterality: N/A;   COLONOSCOPY WITH PROPOFOL N/A 12/22/2014   Procedure: COLONOSCOPY WITH PROPOFOL;  Surgeon: Clarene Essex, MD;  Location: [facility] ENDOSCOPY;  Service: Endoscopy;  Laterality: N/A;  ultra slim scope   COSMETIC SURGERY  11/28/2011   face lift,neck lift, eye lift--Dr. Stephanie Coup   dilatation and curettage  07/30/1980   HOT HEMOSTASIS N/A 12/22/2014   Procedure: HOT HEMOSTASIS (ARGON PLASMA COAGULATION/BICAP);  Surgeon: Clarene Essex, MD;  Location: [facility] ENDOSCOPY;  Service: Endoscopy;  Laterality: N/A;   TONSILLECTOMY     Family History  Problem Relation Age of Onset   Cancer Mother    Cervical cancer Mother    Diabetes Father        adult onset   Stroke Father    Thyroid disease Sister        hyperthyroid   Asthma Sister    Thyroid disease Sister        hypothyroid   Cancer Maternal Grandmother     Stomach cancer Maternal Grandmother    Social History   Socioeconomic History   Marital status: Married    Spouse name: Not on file   Number of children: Not on file   Years of education: Not on file   Highest education level: Not on file  Occupational History   Not on file  Tobacco Use   Smoking status: Never   Smokeless tobacco: Never  Vaping Use   Vaping Use: Never used  Substance and Sexual Activity   Alcohol use: Not Currently    Alcohol/week: 1.0 standard drink    Types: 1 Glasses of wine per week    Comment: Once every two months   Drug use: No   Sexual activity: Yes    Partners: Male    Birth control/protection: Surgical    Comment: [facility]  Other Topics Concern   Not  on file  Social History Narrative   Not on file   Social Determinants of Health   Financial Resource Strain: Low Risk    Difficulty of Paying Living Expenses: Not hard at all  Food Insecurity: No Food Insecurity   Worried About Charity fundraiser in the Last Year: Never true   Arboriculturist in the Last Year: Never true  Transportation Needs: No Transportation Needs   Lack of Transportation (Medical): No   Lack of Transportation (Non-Medical): No  Physical Activity: Inactive   Days of Exercise per Week: 0 days   Minutes of Exercise per Session: 0 min  Stress: No Stress Concern Present   Feeling of Stress : Not at all  Social Connections: Moderately Integrated   Frequency of Communication with Friends and Family: More than three times a week   Frequency of Social Gatherings with Friends and Family: More than three times a week   Attends Religious Services: Never   Marine scientist or Organizations: Yes   Attends Music therapist: More than 4 times per year   Marital Status: Married    Activities of Daily Living In your present state of health, do you have any difficulty performing the following activities: 09/11/2021  Hearing? N  Vision? N  Difficulty concentrating or  making decisions? N  Walking or climbing stairs? N  Dressing or bathing? N  Doing errands, shopping? N  Preparing Food and eating ? N  Using the Toilet? N  In the past six months, have you accidently leaked urine? N  Do you have problems with loss of bowel control? N  Managing your Medications? N  Managing your Finances? N  Housekeeping or managing your Housekeeping? N  Some recent data might be hidden    Patient Education/ Literacy How often do you need to have someone help you when you read instructions, pamphlets, or other written materials from your doctor or pharmacy?: 1 - Never  Exercise Current Exercise Habits: The patient does not participate in regular exercise at present, Exercise limited by: None identified  Diet Patient reports consuming 3 meals a day and 2 snack(s) a day Patient reports that her primary diet is: Regular Patient reports that she does have regular access to food.   Depression Screen PHQ 2/9 Scores 09/11/2021 07/12/2021 03/02/2021 01/25/2021  PHQ - 2 Score 0 4 0 0  PHQ- 9 Score - 10 1 -     Fall Risk Fall Risk  09/11/2021 07/12/2021 03/02/2021  Falls in the past year? 1 0 1  Number falls in past yr: 1 0 1  Injury with Fall? 0 0 0  Risk for fall due to : History of fall(s) No Fall Risks No Fall Risks  Follow up Falls evaluation completed;Education provided;Falls prevention discussed Falls evaluation completed;Education provided Falls evaluation completed     Objective:  Heather Brown seemed alert and oriented and she participated appropriately during our telephone visit.  Blood Pressure Weight BMI  BP Readings from Last 3 Encounters:  09/11/21 132/82  08/23/21 (!) 123/100  07/12/21 (!) 130/100   Wt Readings from Last 3 Encounters:  08/23/21 164 lb 1.9 oz (74.4 kg)  07/12/21 164 lb 1.6 oz (74.4 kg)  03/02/21 164 lb 12.8 oz (74.8 kg)   BMI Readings from Last 1 Encounters:  08/23/21 31.01 kg/m    *Unable to obtain current vital signs,  weight, and BMI due to telephone visit type  Hearing/Vision  Leshawn did not  seem to have difficulty with hearing/understanding during the telephone conversation Reports that she has had a formal eye exam by an eye care professional within the past year Reports that she has had a formal hearing evaluation within the past year *Unable to fully assess hearing and vision during telephone visit type  Cognitive Function: 6CIT Screen 09/11/2021  What Year? 0 points  What month? 0 points  What time? 0 points  Count back from 20 0 points  Months in reverse 0 points  Repeat phrase 0 points  Total Score 0   (Normal:0-7, Significant for Dysfunction: >8)  Normal Cognitive Function Screening: Yes   Immunization & Health Maintenance Record Immunization History  Administered Date(s) Administered   Influenza, High Dose Seasonal PF 07/17/2016   Influenza-Unspecified 04/29/2021   PFIZER(Purple Top)SARS-COV-2 Vaccination 09/06/2019, 10/01/2019, 05/30/2020   Tdap 03/11/2013    Health Maintenance  Topic Date Due   Zoster Vaccines- Shingrix (1 of 2) Never done   Pneumonia Vaccine 63+ Years old (1 - PCV) Never done   COVID-19 Vaccine (4 - Booster for [place] series) 09/27/2021 (Originally 07/25/2020)   TETANUS/TDAP  03/12/2023   INFLUENZA VACCINE  Completed   DEXA SCAN  Completed   HPV VACCINES  Aged Out   COLONOSCOPY (Pts 45-53yrs Insurance coverage will need to be confirmed)  Discontinued   Hepatitis C Screening  Discontinued       Assessment  This is a routine wellness examination for [employer].  Health Maintenance: Due or Overdue Health Maintenance Due  Topic Date Due   Zoster Vaccines- Shingrix (1 of 2) Never done   Pneumonia Vaccine 72+ Years old (1 - PCV) Never done    Heather Brown does not need a referral for Community Assistance: Care Management:   no Social Work:    no Prescription Assistance:  no Nutrition/Diabetes Education:  no   Plan:   Personalized Goals  Goals Addressed             This Visit's Progress    Activity and Exercise Increased       Evidence-based guidance:  Review current exercise levels.  Assess patient perspective on exercise or activity level, barriers to increasing activity, motivation and readiness for change.  Recommend or set healthy exercise goal based on individual tolerance.  Encourage small steps toward making change in amount of exercise or activity.  Urge reduction of sedentary activities or screen time.  Promote group activities within the community or with family or support person.  Consider referral to rehabiliation therapist for assessment and exercise/activity plan.   Notes:  Patient states she would like to walk more.        Personalized Health Maintenance & Screening Recommendations  COVID- patient declines , 1 shingles vaccine  Lung Cancer Screening Recommended: no (Low Dose CT Chest recommended if Age 74-80 years, 30 pack-year currently smoking OR have quit w/in past 15 years) Hepatitis C Screening recommended: no HIV Screening recommended: no  Advanced Directives: Written information was not prepared per patient's request.  Referrals & Orders No orders of the defined types were placed in this encounter.   Follow-up Plan Follow-up with Teola Felipe, Coralee Pesa, NP as planned Schedule 1 year    I have personally reviewed and noted the following in the patients chart:   Medical and social history Use of alcohol, tobacco or illicit drugs  Current medications and supplements Functional ability and status Nutritional status Physical activity Advanced directives List of other physicians Hospitalizations, surgeries, and ER  visits in previous 12 months Vitals Screenings to include cognitive, depression, and falls Referrals and appointments  In addition, I have reviewed and discussed with Heather Brown certain preventive protocols, quality metrics, and best practice  recommendations. A written personalized care plan for preventive services as well as general preventive health recommendations is available and can be mailed to the patient at her request.      Worthy Keeler, DNP, AGNP-c  09/11/2021

## 2021-09-21 ENCOUNTER — Ambulatory Visit (INDEPENDENT_AMBULATORY_CARE_PROVIDER_SITE_OTHER): Payer: Medicare Other | Admitting: Nurse Practitioner

## 2021-09-21 ENCOUNTER — Encounter (HOSPITAL_BASED_OUTPATIENT_CLINIC_OR_DEPARTMENT_OTHER): Payer: Self-pay | Admitting: Nurse Practitioner

## 2021-09-21 DIAGNOSIS — R052 Subacute cough: Secondary | ICD-10-CM

## 2021-09-21 MED ORDER — AZITHROMYCIN 250 MG PO TABS: ORAL_TABLET | ORAL | 0 refills | Status: AC

## 2021-09-21 NOTE — Patient Instructions (Signed)
Mucinex (guaifenesin) 1200mg  twice a day until cough is completely gone for 2 days.   If this does not improve at all by Monday, let me know. We may need to send in a steroid burst to help get the inflammatory reaction down to stop triggering the cough.

## 2021-09-21 NOTE — Assessment & Plan Note (Signed)
Ongoing cough with thick mucous secretions. Some improvement with 5 day z-pack, but returned. Suspect that her cough is related to increased mucous production and difficulty clearing the airways resulting in reactive bronchospasm. Will send in additional treatment with azithromycin and recommend mucinex twice a day to help thin secretions. If no improvement by Monday, consider prednisone burst to help with inflammatory reduction and possible inhaled steroid.  Patient will follow-up if symptoms worsen or do not improve.

## 2021-09-21 NOTE — Progress Notes (Signed)
Virtual Visit Encounter telephone visit.   I connected with  Heather Brown on 09/21/21 at  1:10 PM EST by secure audio and/or video enabled telemedicine application. I verified that I am speaking with the correct person using two identifiers.   I introduced myself as a Designer, jewellery with the practice. The limitations of evaluation and management by telemedicine discussed with the patient and the availability of in person appointments. The patient expressed verbal understanding and consent to proceed.  Participating parties in this visit include: Myself and patient  The patient is: Patient Location: Home I am: Provider Location: Office/Clinic Subjective:    CC and HPI: Heather Brown is a 76 y.o. year old female presenting for follow up of cough. She took completed a z-pack towards the end of last week for a cough that had been present for about 2 weeks. She reports that she did feel a little better while on the z-pack, but once completed her symptoms returned. She is experiencing nasal congestion with thick mucous drainage. She reports that the drainage triggers a cough reflex that is hard and painful. She is concerned that the symptoms have been present for about 4 weeks now. She is using flonase and takes allergy medication daily. She has been staying well hydrated. She has not had any fever or chills that she is aware of. She reports the cough wakes her up in the night and it is very difficult to get back to sleep. She is experiencing sinus congestion. She has been using peppermint oil to help open the sinuses and this does help. She has not had any vomiting, but feels that the cough could result in that many times.   Past medical history, Surgical history, Family history not pertinant except as noted below, Social history, Allergies, and medications have been entered into the medical record, reviewed, and corrections made.   Review of Systems:  All review of systems negative  except what is listed in the HPI  Objective:    Alert and oriented x 4 Speaking in clear sentences with no shortness of breath. No distress.  Impression and Recommendations:    Problem List Items Addressed This Visit     Subacute cough - Primary    Ongoing cough with thick mucous secretions. Some improvement with 5 day z-pack, but returned. Suspect that her cough is related to increased mucous production and difficulty clearing the airways resulting in reactive bronchospasm. Will send in additional treatment with azithromycin and recommend mucinex twice a day to help thin secretions. If no improvement by Monday, consider prednisone burst to help with inflammatory reduction and possible inhaled steroid.  Patient will follow-up if symptoms worsen or do not improve.        orders and follow up as documented in EMR I discussed the assessment and treatment plan with the patient. The patient was provided an opportunity to ask questions and all were answered. The patient agreed with the plan and demonstrated an understanding of the instructions.   The patient was advised to call back or seek an in-person evaluation if the symptoms worsen or if the condition fails to improve as anticipated.  Follow-Up: prn  I provided 23 minutes of non-face-to-face interaction with this non face-to-face encounter including intake, same-day documentation, and chart review.   Orma Render, NP , DNP, AGNP-c [place] [facility] [phone] (fax)

## 2021-10-05 ENCOUNTER — Ambulatory Visit (HOSPITAL_BASED_OUTPATIENT_CLINIC_OR_DEPARTMENT_OTHER): Payer: Medicare Other | Admitting: Nurse Practitioner

## 2021-10-05 ENCOUNTER — Other Ambulatory Visit: Payer: Self-pay

## 2021-10-05 ENCOUNTER — Encounter (HOSPITAL_BASED_OUTPATIENT_CLINIC_OR_DEPARTMENT_OTHER): Payer: Self-pay | Admitting: Nurse Practitioner

## 2021-10-05 VITALS — BP 126/79 | Ht 62.0 in | Wt 166.3 lb

## 2021-10-05 DIAGNOSIS — J3489 Other specified disorders of nose and nasal sinuses: Secondary | ICD-10-CM | POA: Diagnosis not present

## 2021-10-05 DIAGNOSIS — I10 Essential (primary) hypertension: Secondary | ICD-10-CM | POA: Diagnosis not present

## 2021-10-05 DIAGNOSIS — R052 Subacute cough: Secondary | ICD-10-CM

## 2021-10-05 MED ORDER — MONTELUKAST SODIUM 10 MG PO TABS: 10.0000 mg | ORAL_TABLET | Freq: Every day | ORAL | 3 refills | Status: DC

## 2021-10-05 MED ORDER — AZELASTINE HCL 0.1 % NA SOLN: 2.0000 | Freq: Two times a day (BID) | NASAL | 6 refills | Status: DC

## 2021-10-05 NOTE — Progress Notes (Signed)
? ?Established Patient Office Visit ? ?Subjective:  ?Patient ID: Heather Brown, female    DOB: 1946-05-06  Age: 76 y.o. MRN: [id number] ? ?CC:  ?Chief Complaint  ?Patient presents with  ? Follow-up  ?  Coughing and nasal drainage. All clear. She is sleeping in the recliner due to coughing. No fever. Patient states the cough is unreal. This has been going on since 08/2021  ? ? ?HPI ?Heather Brown presents for ongoing cough.  ?She endorses: ?-cough, drainage from nasal passages chronically since February ?- has been sleeping in a recliner to help with night time cough- non productive ?- no fever, chills, sinus p/p, BA, weakness, GERD ?- no significant allergy symptoms historically.  ?- recent medication change to lisinopril at time symptoms started ?- tessalon not helping ?- has completed abx with no change ?- mucous clear ? ? ?Outpatient Medications Prior to Visit  ?Medication Sig Dispense Refill  ? amLODipine-benazepril (LOTREL) 5-20 MG capsule Take 1 capsule by mouth daily. 30 capsule 3  ? aspirin 81 MG tablet Take 81 mg by mouth daily.    ? benzonatate (TESSALON) 200 MG capsule Take 1 capsule (200 mg total) by mouth 3 (three) times daily as needed for cough. 30 capsule 1  ? Biotin 2500 MCG CAPS Take 1 tablet by mouth daily.    ? CALCIUM CARBONATE ANTACID PO Take 1,000 mg by mouth daily as needed for indigestion or heartburn.    ? Cholecalciferol (VITAMIN D3) 1000 UNITS CAPS Take 1,000 Units by mouth daily.    ? fluticasone (FLONASE) 50 MCG/ACT nasal spray Place 2 sprays into both nostrils daily. 16 g 6  ? levothyroxine (SYNTHROID) 75 MCG tablet 75 mcg daily.    ? mometasone (ELOCON) 0.1 % cream Apply 1 application topically daily. 45 g 0  ? Multiple Vitamin (MULTIVITAMIN) capsule Take 1 capsule by mouth daily.    ? Nutritional Supplements (JOINT FORMULA PO) Take 1 capsule by mouth 1 day or 1 dose.    ? sertraline (ZOLOFT) 50 MG tablet Sart with 1/2 tab ('25mg'$ ) for 4 nights then increase to 1 tab  ('50mg'$ ) nightly. 30 tablet 3  ? tretinoin (RETIN-A) 0.05 % cream Apply pea sized amount to affected area of face nightly as needed.  5  ? ?No facility-administered medications prior to visit.  ? ? ?Allergies  ?Allergen Reactions  ? Sulfa Antibiotics Hives  ? Cephalexin Rash  ?  Face turned bright red.  ? ? ?ROS ?Review of Systems ?All review of systems negative except what is listed in the HPI ? ?  ?Objective:  ?  ?Physical Exam ?Vitals and nursing note reviewed.  ?Constitutional:   ?   Appearance: Normal appearance. She is not ill-appearing.  ?HENT:  ?   Head: Normocephalic and atraumatic.  ?   Right Ear: Tympanic membrane normal.  ?   Left Ear: Tympanic membrane normal.  ?   Nose: Mucosal edema and rhinorrhea present.  ?   Right Turbinates: Enlarged, swollen and pale.  ?   Left Turbinates: Enlarged, swollen and pale.  ?   Right Sinus: No maxillary sinus tenderness or frontal sinus tenderness.  ?   Left Sinus: No maxillary sinus tenderness or frontal sinus tenderness.  ?   Mouth/Throat:  ?   Mouth: Mucous membranes are moist.  ?   Pharynx: Oropharynx is clear. No oropharyngeal exudate or posterior oropharyngeal erythema.  ?Eyes:  ?   Extraocular Movements: Extraocular movements intact.  ?   Conjunctiva/sclera: Conjunctivae  normal.  ?   Pupils: Pupils are equal, round, and reactive to light.  ?Neck:  ?   Vascular: No carotid bruit.  ?Cardiovascular:  ?   Rate and Rhythm: Normal rate and regular rhythm.  ?   Pulses: Normal pulses.  ?   Heart sounds: Normal heart sounds.  ?Pulmonary:  ?   Effort: Pulmonary effort is normal.  ?   Breath sounds: Normal breath sounds. No wheezing.  ?Musculoskeletal:     ?   General: Normal range of motion.  ?   Cervical back: Normal range of motion.  ?Lymphadenopathy:  ?   Cervical: No cervical adenopathy.  ?Skin: ?   General: Skin is warm and dry.  ?Neurological:  ?   General: No focal deficit present.  ?   Mental Status: She is alert and oriented to person, place, and time.   ?Psychiatric:     ?   Mood and Affect: Mood normal.     ?   Behavior: Behavior normal.     ?   Thought Content: Thought content normal.     ?   Judgment: Judgment normal.  ? ? ?BP 126/79   Ht '5\' 2"'$  (1.575 m)   Wt 166 lb 4.8 oz (75.4 kg)   LMP 07/30/1980   BMI 30.42 kg/m?  ?Wt Readings from Last 3 Encounters:  ?10/05/21 166 lb 4.8 oz (75.4 kg)  ?08/23/21 164 lb 1.9 oz (74.4 kg)  ?07/12/21 164 lb 1.6 oz (74.4 kg)  ? ?  ?Assessment & Plan:  ? ?Problem List Items Addressed This Visit   ? ? Primary hypertension  ?  Chronic. Well controlled.  ?Consider possible reactive cough to lisinopril. At this time will treat symptoms for allergies given rhinorrhea and pale/boggy nasal mucosa, but consider changing medication to non-ACEi if symptoms do not improve.  ?  ?  ? Subacute cough - Primary  ?  Consider possible reaction to lisinopril vs allergic rhinitis with reactive cough.  ?Discussed with patient and recommend trial of azalastine nasal spray in conjunction with montelukast for treatment. If this is not effective, will consider changing lisinopril to non-ACEi.  ?Patient will f/u next week if sx do not improve.  ?  ?  ? Relevant Medications  ? montelukast (SINGULAIR) 10 MG tablet  ? azelastine (ASTELIN) 0.1 % nasal spray  ? Rhinorrhea  ?  See subacute cough ?  ?  ? Relevant Medications  ? montelukast (SINGULAIR) 10 MG tablet  ? azelastine (ASTELIN) 0.1 % nasal spray  ? ? ?Meds ordered this encounter  ?Medications  ? montelukast (SINGULAIR) 10 MG tablet  ?  Sig: Take 1 tablet (10 mg total) by mouth at bedtime.  ?  Dispense:  30 tablet  ?  Refill:  3  ? azelastine (ASTELIN) 0.1 % nasal spray  ?  Sig: Place 2 sprays into both nostrils 2 (two) times daily. Use in each nostril as directed  ?  Dispense:  30 mL  ?  Refill:  6  ? ? ?Follow-up: Return if symptoms worsen or fail to improve.  ? ? ?Orma Render, NP ?

## 2021-10-05 NOTE — Patient Instructions (Addendum)
Let's try the nasal spray twice a day and pill at bedtime for at least a week to see if this helps.  ?I feel that this is related to allergies causing the drainage down the back of the throat and then the cough is triggered.  ? ?If you are not feeling better in the next week, please call Alyse Low (my nurse) and let me know so we can try something else. You may not be totally better, but you should be feeling better in the next week.  ?

## 2021-10-09 DIAGNOSIS — J3489 Other specified disorders of nose and nasal sinuses: Secondary | ICD-10-CM | POA: Insufficient documentation

## 2021-10-09 HISTORY — DX: Other specified disorders of nose and nasal sinuses: J34.89

## 2021-10-09 NOTE — Assessment & Plan Note (Signed)
Chronic. Well controlled.  ?Consider possible reactive cough to lisinopril. At this time will treat symptoms for allergies given rhinorrhea and pale/boggy nasal mucosa, but consider changing medication to non-ACEi if symptoms do not improve.  ?

## 2021-10-09 NOTE — Assessment & Plan Note (Signed)
Consider possible reaction to lisinopril vs allergic rhinitis with reactive cough.  ?Discussed with patient and recommend trial of azalastine nasal spray in conjunction with montelukast for treatment. If this is not effective, will consider changing lisinopril to non-ACEi.  ?Patient will f/u next week if sx do not improve.  ?

## 2021-10-09 NOTE — Assessment & Plan Note (Signed)
See subacute cough ?

## 2021-10-19 DIAGNOSIS — H524 Presbyopia: Secondary | ICD-10-CM | POA: Diagnosis not present

## 2021-11-02 ENCOUNTER — Ambulatory Visit (HOSPITAL_BASED_OUTPATIENT_CLINIC_OR_DEPARTMENT_OTHER): Payer: Medicare Other | Admitting: Nurse Practitioner

## 2021-11-02 NOTE — Patient Instructions (Incomplete)
It was a pleasure seeing you today. I hope your time spent with [place] was pleasant and helpful. Please let us know if there is anything we can do to improve the service you receive.  ? ?Today we discussed concerns with: ? ?There are no diagnoses linked to this encounter. ? ? ?The following orders have been placed for you today: ? ?No orders of the defined types were placed in this encounter. ? ? ? ?Important Office Information ?Lab Results ?If labs were ordered, please note that you will see results through [place] as soon as they come available from [place].  ?It takes up to 5 business days for the results to be routed to me and for me to review them once all of the lab results have come through from [facility]. I will make recommendations based on your results and send these through [place] or someone from the office will call you to discuss. If your labs are abnormal, we may contact you to schedule a visit to discuss the results and make recommendations.  ?If you have not heard from [place] within 5 business days or you have waited longer than a week and your lab results have not come through on [place], please feel free to call the office or send a message through [place] to follow-up on these labs.  ? ?Referrals ?If referrals were placed today, the office where the referral was sent will contact you either by phone or through [place] to set up scheduling. Please note that it can take up to a week for the referral office to contact you. If you do not hear from them in a week, please contact the referral office directly to inquire about scheduling.  ? ?Condition Treated ?If your condition worsens or you begin to have new symptoms, please schedule a follow-up appointment for further evaluation. If you are not sure if an appointment is needed, you may call the office to leave a message for the nurse and someone will contact you with recommendations.  ?If you have an urgent or life threatening emergency, please do not call the  office, but seek emergency evaluation by calling 911 or going to the nearest emergency room for evaluation.  ? ?MyChart and Phone Calls ?Please do not use MyChart for urgent messages. It may take up to 3 business days for MyChart messages to be read by staff and if they are unable to handle the request, an additional 3 business days for them to be routed to me and for my response.  ?Messages sent to the provider through [place] do not come directly to the provider, please allow time for these messages to be routed and for me to respond.  ?We get a large volume of MyChart messages daily and these are responded to in the order received.  ? ?For urgent messages, please call the office at [phone] and speak with the front office staff or leave a message on the line of my assistant for guidance.  ?We are seeing patients from the hours of 8:00 am through 5:00 pm and calls directly to the nurse may not be answered immediately due to seeing patients, but your call will be returned as soon as possible.  ?Phone  messages received after 4:00 PM Monday through Thursday may not be returned until the following business day. Phone messages received after 11:00 AM on Friday may not be returned until Monday.  ? ?After Hours ?We share on call hours with providers from other offices. If you have  an urgent need after hours that cannot wait until the next business day, please contact the on call provider by calling the office number. A nurse will speak with you and contact the provider if needed for recommendations.  ?If you have an urgent or life threatening emergency after hours, please do not call the on call provider, but seek emergency evaluation by calling 911 or going to the nearest emergency room for evaluation.  ? ?Paperwork ?All paperwork requires a minimum of 5 days to complete and return to you or the designated personnel. Please keep this in mind when bringing in forms or sending requests for paperwork completion to the  office.  ?  ?

## 2021-11-02 NOTE — Progress Notes (Deleted)
? ?  Established Patient Office Visit ? ?Subjective:  ?Patient ID: Solace Manwarren, female    DOB: 05/04/1946  Age: 76 y.o. MRN: [id number] ? ?CC: No chief complaint on file. ? ? ?HPI ?Caryl Never [place] presents for persistent sinus symptoms.  ?She was first seen in February for cough and sinus congestion and treated with antibiotics. He was seen again in March for the same symptoms with additional treatment.  ?Today she reports the following: ? ? ? ?Outpatient Medications Prior to Visit  ?Medication Sig Dispense Refill  ? amLODipine-benazepril (LOTREL) 5-20 MG capsule Take 1 capsule by mouth daily. 30 capsule 3  ? aspirin 81 MG tablet Take 81 mg by mouth daily.    ? azelastine (ASTELIN) 0.1 % nasal spray Place 2 sprays into both nostrils 2 (two) times daily. Use in each nostril as directed 30 mL 6  ? benzonatate (TESSALON) 200 MG capsule Take 1 capsule (200 mg total) by mouth 3 (three) times daily as needed for cough. 30 capsule 1  ? Biotin 2500 MCG CAPS Take 1 tablet by mouth daily.    ? CALCIUM CARBONATE ANTACID PO Take 1,000 mg by mouth daily as needed for indigestion or heartburn.    ? Cholecalciferol (VITAMIN D3) 1000 UNITS CAPS Take 1,000 Units by mouth daily.    ? fluticasone (FLONASE) 50 MCG/ACT nasal spray Place 2 sprays into both nostrils daily. 16 g 6  ? levothyroxine (SYNTHROID) 75 MCG tablet 75 mcg daily.    ? mometasone (ELOCON) 0.1 % cream Apply 1 application topically daily. 45 g 0  ? montelukast (SINGULAIR) 10 MG tablet Take 1 tablet (10 mg total) by mouth at bedtime. 30 tablet 3  ? Multiple Vitamin (MULTIVITAMIN) capsule Take 1 capsule by mouth daily.    ? Nutritional Supplements (JOINT FORMULA PO) Take 1 capsule by mouth 1 day or 1 dose.    ? sertraline (ZOLOFT) 50 MG tablet Sart with 1/2 tab ('25mg'$ ) for 4 nights then increase to 1 tab ('50mg'$ ) nightly. 30 tablet 3  ? tretinoin (RETIN-A) 0.05 % cream Apply pea sized amount to affected area of face nightly as needed.  5  ? ?No  facility-administered medications prior to visit.  ? ? ?Allergies  ?Allergen Reactions  ? Sulfa Antibiotics Hives  ? Cephalexin Rash  ?  Face turned bright red.  ? ? ?ROS ?Review of Systems ?All review of systems negative except what is listed in the HPI ? ?  ?Objective:  ?  ?Physical Exam ? ?LMP 07/30/1980  ?Wt Readings from Last 3 Encounters:  ?10/05/21 166 lb 4.8 oz (75.4 kg)  ?08/23/21 164 lb 1.9 oz (74.4 kg)  ?07/12/21 164 lb 1.6 oz (74.4 kg)  ? ? ? ?Health Maintenance Due  ?Topic Date Due  ? Zoster Vaccines- Shingrix (1 of 2) Never done  ? Pneumonia Vaccine 87+ Years old (1 - PCV) Never done  ? COVID-19 Vaccine (4 - Booster for [place] series) 07/25/2020  ? ?  ?Assessment & Plan:  ? ?Problem List Items Addressed This Visit   ?None ? ? ?No orders of the defined types were placed in this encounter. ? ? ?Follow-up: No follow-ups on file.  ? ? ?Orma Render, NP ?

## 2021-11-06 DIAGNOSIS — E119 Type 2 diabetes mellitus without complications: Secondary | ICD-10-CM | POA: Diagnosis not present

## 2021-11-06 DIAGNOSIS — E039 Hypothyroidism, unspecified: Secondary | ICD-10-CM | POA: Diagnosis not present

## 2021-11-08 ENCOUNTER — Encounter (HOSPITAL_BASED_OUTPATIENT_CLINIC_OR_DEPARTMENT_OTHER): Payer: Self-pay | Admitting: Nurse Practitioner

## 2021-11-08 ENCOUNTER — Ambulatory Visit (HOSPITAL_BASED_OUTPATIENT_CLINIC_OR_DEPARTMENT_OTHER): Payer: Medicare Other | Admitting: Nurse Practitioner

## 2021-11-08 VITALS — BP 128/72 | HR 86 | Temp 98.7°F | Ht 62.0 in | Wt 166.2 lb

## 2021-11-08 DIAGNOSIS — J4541 Moderate persistent asthma with (acute) exacerbation: Secondary | ICD-10-CM | POA: Diagnosis not present

## 2021-11-08 MED ORDER — ALBUTEROL SULFATE HFA 108 (90 BASE) MCG/ACT IN AERS: 1.0000 | INHALATION_SPRAY | RESPIRATORY_TRACT | 11 refills | Status: DC | PRN

## 2021-11-08 MED ORDER — ADVAIR HFA 45-21 MCG/ACT IN AERO: 2.0000 | INHALATION_SPRAY | Freq: Two times a day (BID) | RESPIRATORY_TRACT | 12 refills | Status: DC

## 2021-11-08 NOTE — Progress Notes (Signed)
? ?Established Patient Office Visit ? ?Subjective:  ?Patient ID: Heather Brown, female    DOB: 10-31-1945  Age: 76 y.o. MRN: [id number] ? ?CC:  ?Chief Complaint  ?Patient presents with  ? Cough  ? rhinorrhea  ? ? ?HPI ?Heather Brown presents for continued cough, congestion, and drainage.  ? ?Ongoing concern present for the past month. ?Night time runny nose- starts running down the back of her throat.  ?Triggers coughing spells and cannot stop coughing. Coughs to the point of throwing up at times. ?She is not sure if the PND is the precursor to the cough always or just occasionally.  ?She is waking up all during the night coughing.  ?The cough is productive with thick and sticky mucus. White and "foamy" in nature.  ?Chews chewing gum to keep her throat moist.  ?If there is heat or odors (like perfume or air freshener) the symptoms are triggered worse ?She has tried azelastine, montelukast, flonase without relief. She has completed antibiotic therapy for suspected infection.  ?No fevers, chills, body aches. Main symptom is drainage, mucous, and cough. No wheezing, shortness of breath.  ?Very frustrated over continued symptoms despite trying multiple remedies.  ? ? ?ROS ?Review of Systems ?All review of systems negative except what is listed in the HPI ? ?  ?Objective:  ?  ?Physical Exam ?Vitals and nursing note reviewed.  ?Constitutional:   ?   Appearance: Normal appearance. She is normal weight.  ?HENT:  ?   Head: Normocephalic.  ?   Right Ear: Tympanic membrane normal.  ?   Left Ear: Tympanic membrane normal.  ?   Nose: Nose normal.  ?   Mouth/Throat:  ?   Mouth: Mucous membranes are moist.  ?   Pharynx: Posterior oropharyngeal erythema present.  ?Eyes:  ?   Extraocular Movements: Extraocular movements intact.  ?   Conjunctiva/sclera: Conjunctivae normal.  ?   Pupils: Pupils are equal, round, and reactive to light.  ?Cardiovascular:  ?   Rate and Rhythm: Normal rate and regular rhythm.  ?   Pulses:  Normal pulses.  ?   Heart sounds: Normal heart sounds.  ?Pulmonary:  ?   Effort: Pulmonary effort is normal. No respiratory distress.  ?   Breath sounds: Wheezing present. No rhonchi.  ?Chest:  ?   Chest wall: No tenderness.  ?Abdominal:  ?   General: Bowel sounds are normal. There is no distension.  ?   Palpations: Abdomen is soft.  ?   Tenderness: There is no abdominal tenderness.  ?Musculoskeletal:  ?   Cervical back: Normal range of motion.  ?   Right lower leg: No edema.  ?   Left lower leg: No edema.  ?Lymphadenopathy:  ?   Cervical: No cervical adenopathy.  ?Skin: ?   General: Skin is warm and dry.  ?   Capillary Refill: Capillary refill takes less than 2 seconds.  ?Neurological:  ?   General: No focal deficit present.  ?   Mental Status: She is alert and oriented to person, place, and time.  ?Psychiatric:     ?   Mood and Affect: Mood normal.     ?   Behavior: Behavior normal.     ?   Thought Content: Thought content normal.     ?   Judgment: Judgment normal.  ? ? ?BP 128/72   Pulse 86   Temp 98.7 ?F (37.1 ?C)   Ht '5\' 2"'$  (1.575 m)   Wt 166  lb 3.2 oz (75.4 kg)   LMP 07/30/1980   SpO2 97%   BMI 30.40 kg/m?  ?Wt Readings from Last 3 Encounters:  ?11/08/21 166 lb 3.2 oz (75.4 kg)  ?10/05/21 166 lb 4.8 oz (75.4 kg)  ?08/23/21 164 lb 1.9 oz (74.4 kg)  ? ?  ?Assessment & Plan:  ? ?Problem List Items Addressed This Visit   ? ? Allergy-induced asthma, moderate persistent, with acute exacerbation - Primary  ?  Ongoing cough with postnasal drip contributing to reflex. Increased thick mucous production present. Suspect symptoms are related to allergens due to timing of onset and lack of response to antibiotic treatment.  ?We have tried multiple remedies, but nothing has been effective.  ?Discussed with patient the option of pulmonology referral for further evaluation vs ENT. At this time she would like to try inhalers to see if this helps with her symptoms.  ?Will trial advair and albuterol to see if this is  effective. If no change in the next 7-10 days, will re-evaluate referral for further recommendations.  ? ?  ?  ? Relevant Medications  ? albuterol (VENTOLIN HFA) 108 (90 Base) MCG/ACT inhaler  ? fluticasone-salmeterol (ADVAIR HFA) 45-21 MCG/ACT inhaler  ? ? ?Meds ordered this encounter  ?Medications  ? albuterol (VENTOLIN HFA) 108 (90 Base) MCG/ACT inhaler  ?  Sig: Inhale 1-2 puffs into the lungs every 4 (four) hours as needed for wheezing (cough). SHORT ACTING AS NEEDED FOR COUGH/WHEEZE  ?  Dispense:  2 each  ?  Refill:  11  ? fluticasone-salmeterol (ADVAIR HFA) 45-21 MCG/ACT inhaler  ?  Sig: Inhale 2 puffs into the lungs 2 (two) times daily. LONG ACTING FOR COUGH/WHEEZE  ?  Dispense:  1 each  ?  Refill:  12  ? ? ?Follow-up: Return if symptoms worsen or fail to improve.  ? ? ?Orma Render, NP ?

## 2021-11-08 NOTE — Patient Instructions (Addendum)
It was a pleasure seeing you today. I hope your time spent with [place] was pleasant and helpful. Please let us know if there is anything we can do to improve the service you receive.  ? ?Today we discussed concerns with: ? ?Cough ?- I think this is severe allergies and runny nose dripping down the back of the throat.  ?I want you to try the following: ? ?STOP flonase ?Keep taking azelastine ? ?Keep taking Montelukast at bedtime ? ?START Albuterol as needed every 4-6 hours 1-2 puffs. ?START Advair (fluticasone-salmeterol) 2 puffs in the morning and 2 puffs before bed every day.  ? ?If you still have symptoms after 2-3 days then I want you to add Xyzal (levocitirizine) (available over the counter) 1 pill at bedtime every night and see if that helps. This is an additional allergy medication.  ? ?Important Office Information ?Lab Results ?If labs were ordered, please note that you will see results through [place] as soon as they come available from [place].  ?It takes up to 5 business days for the results to be routed to me and for me to review them once all of the lab results have come through from [facility]. I will make recommendations based on your results and send these through [place] or someone from the office will call you to discuss. If your labs are abnormal, we may contact you to schedule a visit to discuss the results and make recommendations.  ?If you have not heard from [place] within 5 business days or you have waited longer than a week and your lab results have not come through on [place], please feel free to call the office or send a message through [place] to follow-up on these labs.  ? ?Referrals ?If referrals were placed today, the office where the referral was sent will contact you either by phone or through [place] to set up scheduling. Please note that it can take up to a week for the referral office to contact you. If you do not hear from them in a week, please contact the referral office directly to  inquire about scheduling.  ? ?Condition Treated ?If your condition worsens or you begin to have new symptoms, please schedule a follow-up appointment for further evaluation. If you are not sure if an appointment is needed, you may call the office to leave a message for the nurse and someone will contact you with recommendations.  ?If you have an urgent or life threatening emergency, please do not call the office, but seek emergency evaluation by calling 911 or going to the nearest emergency room for evaluation.  ? ?MyChart and Phone Calls ?Please do not use MyChart for urgent messages. It may take up to 3 business days for MyChart messages to be read by staff and if they are unable to handle the request, an additional 3 business days for them to be routed to me and for my response.  ?Messages sent to the provider through [place] do not come directly to the provider, please allow time for these messages to be routed and for me to respond.  ?We get a large volume of MyChart messages daily and these are responded to in the order received.  ? ?For urgent messages, please call the office at [phone] and speak with the front office staff or leave a message on the line of my assistant for guidance.  ?We are seeing patients from the hours of 8:00 am through 5:00 pm and calls directly to the nurse  may not be answered immediately due to seeing patients, but your call will be returned as soon as possible.  ?Phone  messages received after 4:00 PM Monday through Thursday may not be returned until the following business day. Phone messages received after 11:00 AM on Friday may not be returned until Monday.  ? ?After Hours ?We share on call hours with providers from other offices. If you have an urgent need after hours that cannot wait until the next business day, please contact the on call provider by calling the office number. A nurse will speak with you and contact the provider if needed for recommendations.  ?If you have  an urgent or life threatening emergency after hours, please do not call the on call provider, but seek emergency evaluation by calling 911 or going to the nearest emergency room for evaluation.  ? ?Paperwork ?All paperwork requires a minimum of 5 days to complete and return to you or the designated personnel. Please keep this in mind when bringing in forms or sending requests for paperwork completion to the office.  ?  ?

## 2021-11-10 ENCOUNTER — Ambulatory Visit (INDEPENDENT_AMBULATORY_CARE_PROVIDER_SITE_OTHER): Payer: Medicare Other

## 2021-11-10 ENCOUNTER — Telehealth (HOSPITAL_BASED_OUTPATIENT_CLINIC_OR_DEPARTMENT_OTHER): Payer: Medicare Other

## 2021-11-10 DIAGNOSIS — Z0000 Encounter for general adult medical examination without abnormal findings: Secondary | ICD-10-CM | POA: Diagnosis not present

## 2021-11-10 DIAGNOSIS — E041 Nontoxic single thyroid nodule: Secondary | ICD-10-CM | POA: Diagnosis not present

## 2021-11-10 DIAGNOSIS — E039 Hypothyroidism, unspecified: Secondary | ICD-10-CM | POA: Diagnosis not present

## 2021-11-10 NOTE — Progress Notes (Signed)
? ?Subjective:  ? I connected with  Heather Brown on 11/10/21 by a audio enabled telemedicine application and verified that I am speaking with the correct person using two identifiers. ? ?Patient Location: Home ? ?Provider Location: Home Office ? ?I discussed the limitations of evaluation and management by telemedicine. The patient expressed understanding and agreed to proceed. Heather Brown is a 76 y.o. female who presents for Medicare Annual (Subsequent) preventive examination. ? ? ?   ?Objective:  ?  ?Today's Vitals  ? 11/10/21 1306  ?PainSc: 0-No pain  ? ?There is no height or weight on file to calculate BMI. ? ? ?  09/11/2021  ?  2:52 PM 08/23/2021  ? 11:10 AM 02/22/2021  ? 10:20 AM 02/08/2021  ?  4:23 PM 08/24/2020  ? 10:38 AM 05/24/2020  ? 10:36 AM 03/23/2020  ? 12:05 PM  ?Advanced Directives  ?Does Patient Have a Medical Advance Directive? Yes Yes Yes Yes Yes Yes Yes  ?Type of Paramedic of [place];Living will [place];Living will Living will;Healthcare Power of [place];Living will [place];Living will [place];Living will  ?Does patient want to make changes to medical advance directive? Yes (Inpatient - patient requests chaplain consult to change a medical advance directive)  No - Patient declined Yes (Inpatient - patient defers changing a medical advance directive and declines information at this time)   No - Patient declined  ?Copy of [place] in Chart?      No - copy requested No - copy requested  ? ? ?Current Medications (verified) ?Outpatient Encounter Medications as of 11/10/2021  ?Medication Sig  ? albuterol (VENTOLIN HFA) 108 (90 Base) MCG/ACT inhaler Inhale 1-2 puffs into the lungs every 4 (four) hours as needed for wheezing (cough). SHORT ACTING AS NEEDED FOR COUGH/WHEEZE  ? amLODipine-benazepril (LOTREL) 5-20 MG capsule Take  1 capsule by mouth daily.  ? aspirin 81 MG tablet Take 81 mg by mouth daily.  ? azelastine (ASTELIN) 0.1 % nasal spray Place 2 sprays into both nostrils 2 (two) times daily. Use in each nostril as directed  ? benzonatate (TESSALON) 200 MG capsule Take 1 capsule (200 mg total) by mouth 3 (three) times daily as needed for cough.  ? Biotin 2500 MCG CAPS Take 1 tablet by mouth daily.  ? CALCIUM CARBONATE ANTACID PO Take 1,000 mg by mouth daily as needed for indigestion or heartburn.  ? Cholecalciferol (VITAMIN D3) 1000 UNITS CAPS Take 1,000 Units by mouth daily.  ? fluticasone-salmeterol (ADVAIR HFA) 45-21 MCG/ACT inhaler Inhale 2 puffs into the lungs 2 (two) times daily. LONG ACTING FOR COUGH/WHEEZE  ? levothyroxine (SYNTHROID) 75 MCG tablet 75 mcg daily.  ? mometasone (ELOCON) 0.1 % cream Apply 1 application topically daily.  ? montelukast (SINGULAIR) 10 MG tablet Take 1 tablet (10 mg total) by mouth at bedtime.  ? Multiple Vitamin (MULTIVITAMIN) capsule Take 1 capsule by mouth daily.  ? Nutritional Supplements (JOINT FORMULA PO) Take 1 capsule by mouth 1 day or 1 dose.  ? sertraline (ZOLOFT) 50 MG tablet Sart with 1/2 tab ('25mg'$ ) for 4 nights then increase to 1 tab ('50mg'$ ) nightly.  ? tretinoin (RETIN-A) 0.05 % cream Apply pea sized amount to affected area of face nightly as needed.  ? ?No facility-administered encounter medications on file as of 11/10/2021.  ? ? ?Allergies (verified) ?Sulfa antibiotics and Cephalexin  ? ?History: ?Past Medical History:  ?  Diagnosis Date  ? Dyspareunia   ? dryness with intercourse  ? Endometriosis 1982  ? found at [place]  ? GERD (gastroesophageal reflux disease)   ? Heart murmur   ? History of left shoulder fracture 2017  ? Hypothyroidism   ? Left ACL tear   ? Osteopenia 2009  ? Pneumonia   ? Polycythemia vera ([place]) 12/02/2017  ? PONV (postoperative nausea and vomiting)   ? Thyroid nodule   ? followed by Dr Chalmers Cater  ? Urinary incontinence   ? with coughing/sneezing  ? ?Past Surgical History:   ?Procedure Laterality Date  ? ABDOMINAL HYSTERECTOMY  07/30/1980  ? TAH--for abnormal paps/endometriosis  ? AUGMENTATION MAMMAPLASTY  07/31/1975  ? BREAST IMPLANT EXCHANGE  10/2020  ? COLONOSCOPY WITH PROPOFOL N/A 10/29/2013  ? Procedure: COLONOSCOPY WITH PROPOFOL;  Surgeon: Jeryl Columbia, MD;  Location: [facility] ENDOSCOPY;  Service: Endoscopy;  Laterality: N/A;  ? COLONOSCOPY WITH PROPOFOL N/A 12/22/2014  ? Procedure: COLONOSCOPY WITH PROPOFOL;  Surgeon: Clarene Essex, MD;  Location: [facility] ENDOSCOPY;  Service: Endoscopy;  Laterality: N/A;  ultra slim scope  ? COSMETIC SURGERY  11/28/2011  ? face lift,neck lift, eye lift--Dr. Stephanie Coup  ? dilatation and curettage  07/30/1980  ? HOT HEMOSTASIS N/A 12/22/2014  ? Procedure: HOT HEMOSTASIS (ARGON PLASMA COAGULATION/BICAP);  Surgeon: Clarene Essex, MD;  Location: [facility] ENDOSCOPY;  Service: Endoscopy;  Laterality: N/A;  ? TONSILLECTOMY    ? ?Family History  ?Problem Relation Age of Onset  ? Cancer Mother   ? Cervical cancer Mother   ? Diabetes Father   ?     adult onset  ? Stroke Father   ? Thyroid disease Sister   ?     hyperthyroid  ? Asthma Sister   ? Thyroid disease Sister   ?     hypothyroid  ? Cancer Maternal Grandmother   ? Stomach cancer Maternal Grandmother   ? ?Social History  ? ?Socioeconomic History  ? Marital status: Married  ?  Spouse name: Not on file  ? Number of children: Not on file  ? Years of education: Not on file  ? Highest education level: Not on file  ?Occupational History  ? Not on file  ?Tobacco Use  ? Smoking status: Never  ? Smokeless tobacco: Never  ?Vaping Use  ? Vaping Use: Never used  ?Substance and Sexual Activity  ? Alcohol use: Not Currently  ?  Alcohol/week: 1.0 standard drink  ?  Types: 1 Glasses of wine per week  ?  Comment: Once every two months  ? Drug use: No  ? Sexual activity: Yes  ?  Partners: Male  ?  Birth control/protection: Surgical  ?  Comment: TAH  ?Other Topics Concern  ? Not on file  ?Social History Narrative  ? Not on file  ? ?Social  Determinants of Health  ? ?Financial Resource Strain: Low Risk   ? Difficulty of Paying Living Expenses: Not hard at all  ?Food Insecurity: No Food Insecurity  ? Worried About Charity fundraiser in the Last Year: Never true  ? Ran Out of Food in the Last Year: Never true  ?Transportation Needs: No Transportation Needs  ? Lack of Transportation (Medical): No  ? Lack of Transportation (Non-Medical): No  ?Physical Activity: Inactive  ? Days of Exercise per Week: 0 days  ? Minutes of Exercise per Session: 0 min  ?Stress: No Stress Concern Present  ? Feeling of Stress : Not at all  ?Social Connections: Moderately Integrated  ?  Frequency of Communication with Friends and Family: More than three times a week  ? Frequency of Social Gatherings with Friends and Family: More than three times a week  ? Attends Religious Services: Never  ? Active Member of Clubs or Organizations: Yes  ? Attends Archivist Meetings: More than 4 times per year  ? Marital Status: Married  ? ? ?Tobacco Counseling ?Counseling given: Not Answered ? ? ?Clinical Intake: ? ?  ? ?Pain : No/denies pain ?Pain Score: 0-No pain ? ?  ? ?Nutritional Risks: None ?Diabetes: No ? ?How often do you need to have someone help you when you read instructions, pamphlets, or other written materials from your doctor or pharmacy?: 1 - Never ? ?Diabetic?no ? ?Interpreter Needed?: No ? ?  ? ? ?Activities of Daily Living ? ?  09/11/2021  ?  2:52 PM  ?In your present state of health, do you have any difficulty performing the following activities:  ?Hearing? 0  ?Vision? 0  ?Difficulty concentrating or making decisions? 0  ?Walking or climbing stairs? 0  ?Dressing or bathing? 0  ?Doing errands, shopping? 0  ?Preparing Food and eating ? N  ?Using the Toilet? N  ?In the past six months, have you accidently leaked urine? N  ?Do you have problems with loss of bowel control? N  ?Managing your Medications? N  ?Managing your Finances? N  ?Housekeeping or managing your  Housekeeping? N  ? ? ?Patient Care Team: ?Early, Coralee Pesa, NP as PCP - General (Nurse Practitioner) ? ?Indicate any recent Medical Services you may have received from other than [facility] providers in the past year (date m

## 2021-11-13 DIAGNOSIS — J4541 Moderate persistent asthma with (acute) exacerbation: Secondary | ICD-10-CM | POA: Insufficient documentation

## 2021-11-13 NOTE — Assessment & Plan Note (Signed)
Ongoing cough with postnasal drip contributing to reflex. Increased thick mucous production present. Suspect symptoms are related to allergens due to timing of onset and lack of response to antibiotic treatment.  ?We have tried multiple remedies, but nothing has been effective.  ?Discussed with patient the option of pulmonology referral for further evaluation vs ENT. At this time she would like to try inhalers to see if this helps with her symptoms.  ?Will trial advair and albuterol to see if this is effective. If no change in the next 7-10 days, will re-evaluate referral for further recommendations.  ?

## 2021-11-16 ENCOUNTER — Other Ambulatory Visit: Payer: Self-pay | Admitting: Hematology & Oncology

## 2021-11-16 ENCOUNTER — Other Ambulatory Visit (HOSPITAL_BASED_OUTPATIENT_CLINIC_OR_DEPARTMENT_OTHER): Payer: Self-pay | Admitting: Nurse Practitioner

## 2021-11-16 DIAGNOSIS — I10 Essential (primary) hypertension: Secondary | ICD-10-CM

## 2021-11-16 DIAGNOSIS — F411 Generalized anxiety disorder: Secondary | ICD-10-CM

## 2021-11-22 ENCOUNTER — Inpatient Hospital Stay: Payer: Medicare Other | Attending: Hematology & Oncology

## 2021-11-22 ENCOUNTER — Encounter: Payer: Self-pay | Admitting: Hematology & Oncology

## 2021-11-22 ENCOUNTER — Inpatient Hospital Stay: Payer: Medicare Other | Admitting: Hematology & Oncology

## 2021-11-22 ENCOUNTER — Other Ambulatory Visit: Payer: Self-pay

## 2021-11-22 ENCOUNTER — Inpatient Hospital Stay: Payer: Medicare Other

## 2021-11-22 ENCOUNTER — Telehealth: Payer: Self-pay | Admitting: Hematology & Oncology

## 2021-11-22 VITALS — BP 112/55 | HR 90 | Temp 98.7°F | Resp 18 | Wt 169.0 lb

## 2021-11-22 DIAGNOSIS — D45 Polycythemia vera: Secondary | ICD-10-CM | POA: Insufficient documentation

## 2021-11-22 DIAGNOSIS — Z7982 Long term (current) use of aspirin: Secondary | ICD-10-CM | POA: Diagnosis not present

## 2021-11-22 DIAGNOSIS — Z79899 Other long term (current) drug therapy: Secondary | ICD-10-CM | POA: Insufficient documentation

## 2021-11-22 DIAGNOSIS — J45909 Unspecified asthma, uncomplicated: Secondary | ICD-10-CM | POA: Diagnosis not present

## 2021-11-22 DIAGNOSIS — Z7951 Long term (current) use of inhaled steroids: Secondary | ICD-10-CM | POA: Insufficient documentation

## 2021-11-22 LAB — CBC WITH DIFFERENTIAL (CANCER CENTER ONLY)
Abs Immature Granulocytes: 0.01 10*3/uL (ref 0.00–0.07)
Basophils Absolute: 0 10*3/uL (ref 0.0–0.1)
Basophils Relative: 1 %
Eosinophils Absolute: 0.1 10*3/uL (ref 0.0–0.5)
Eosinophils Relative: 3 %
HCT: 41.5 % (ref 36.0–46.0)
Hemoglobin: 12.4 g/dL (ref 12.0–15.0)
Immature Granulocytes: 0 %
Lymphocytes Relative: 30 %
Lymphs Abs: 1.3 10*3/uL (ref 0.7–4.0)
MCH: 23.2 pg — ABNORMAL LOW (ref 26.0–34.0)
MCHC: 29.9 g/dL — ABNORMAL LOW (ref 30.0–36.0)
MCV: 77.6 fL — ABNORMAL LOW (ref 80.0–100.0)
Monocytes Absolute: 0.5 10*3/uL (ref 0.1–1.0)
Monocytes Relative: 12 %
Neutro Abs: 2.3 10*3/uL (ref 1.7–7.7)
Neutrophils Relative %: 54 %
Platelet Count: 333 10*3/uL (ref 150–400)
RBC: 5.35 MIL/uL — ABNORMAL HIGH (ref 3.87–5.11)
RDW: 16.7 % — ABNORMAL HIGH (ref 11.5–15.5)
WBC Count: 4.2 10*3/uL (ref 4.0–10.5)
nRBC: 0 % (ref 0.0–0.2)

## 2021-11-22 LAB — CMP (CANCER CENTER ONLY)
ALT: 25 U/L (ref 0–44)
AST: 20 U/L (ref 15–41)
Albumin: 4.2 g/dL (ref 3.5–5.0)
Alkaline Phosphatase: 112 U/L (ref 38–126)
Anion gap: 8 (ref 5–15)
BUN: 18 mg/dL (ref 8–23)
CO2: 27 mmol/L (ref 22–32)
Calcium: 10.8 mg/dL — ABNORMAL HIGH (ref 8.9–10.3)
Chloride: 107 mmol/L (ref 98–111)
Creatinine: 0.83 mg/dL (ref 0.44–1.00)
GFR, Estimated: >60 mL/min (ref >60)
Glucose, Bld: 127 mg/dL — ABNORMAL HIGH (ref 70–99)
Potassium: 4.4 mmol/L (ref 3.5–5.1)
Sodium: 142 mmol/L (ref 135–145)
Total Bilirubin: 0.7 mg/dL (ref 0.3–1.2)
Total Protein: 7.1 g/dL (ref 6.5–8.1)

## 2021-11-22 LAB — FERRITIN: Ferritin: 5 ng/mL — ABNORMAL LOW (ref 11–307)

## 2021-11-22 LAB — IRON AND IRON BINDING CAPACITY (CC-WL,HP ONLY)
Iron: 37 ug/dL (ref 28–170)
Saturation Ratios: 7 % — ABNORMAL LOW (ref 10.4–31.8)
TIBC: 524 ug/dL — ABNORMAL HIGH (ref 250–450)
UIBC: 487 ug/dL — ABNORMAL HIGH (ref 148–442)

## 2021-11-22 LAB — LACTATE DEHYDROGENASE: LDH: 151 U/L (ref 98–192)

## 2021-11-22 NOTE — Telephone Encounter (Signed)
Called to schedule per 4/26 los , patient was at the store and unable to talk to schedule , will call me back to schedule  ?

## 2021-11-22 NOTE — Progress Notes (Signed)
?Hematology and Oncology Follow Up Visit ? ?Heather Brown ?[id number] ?[birth year] 76 y.o. ?11/22/2021 ? ? ?Principle Diagnosis:  ?Polycythemia vera -- DNMT3A (+),  Epo = 5.7 ? ?Current Therapy:   ?Phlebotomy to maintain hematocrit below 45% ?Aspirin 81 mg p.o. daily ?    ?Interim History:  Heather Brown is back for follow-up.  She is doing okay.  Her main problem has been the pollen.  Because we really had no winter, has been quite a bit of pollen.  She been having a lot of mucus.  She has seen her family doctor.  She has been put on some inhalers.  She does not like taking these. ? ?She is now on blood pressure medicine.  This is working quite well.  I told that she really needs to stay on blood pressure medication. ? ?When we last saw her in January, her ferritin was 8 with an iron saturation of 11%. ? ?She has had no fever.  She has had no nausea or vomiting.  There is been no change in bowel or bladder habits. ? ?Is been over a year since she has had a mammogram.  She said that she will make sure to get 1. ? ?She has had no leg swelling.  She has had no rashes.  There has been no headache. ? ?Overall, I would say performance status is probably ECOG 1.     ? ?Medications:  ?Current Outpatient Medications:  ?  albuterol (VENTOLIN HFA) 108 (90 Base) MCG/ACT inhaler, Inhale 1-2 puffs into the lungs every 4 (four) hours as needed for wheezing (cough). SHORT ACTING AS NEEDED FOR COUGH/WHEEZE, Disp: 2 each, Rfl: 11 ?  albuterol (VENTOLIN HFA) 108 (90 Base) MCG/ACT inhaler, 1 puff as needed, Disp: , Rfl:  ?  amLODipine-benazepril (LOTREL) 5-20 MG capsule, TAKE 1 CAPSULE BY MOUTH DAILY, Disp: 30 capsule, Rfl: 3 ?  aspirin 81 MG tablet, Take 81 mg by mouth daily., Disp: , Rfl:  ?  azelastine (ASTELIN) 0.1 % nasal spray, Place 2 sprays into both nostrils 2 (two) times daily. Use in each nostril as directed, Disp: 30 mL, Rfl: 6 ?  benzonatate (TESSALON) 200 MG capsule, Take 1 capsule (200 mg total) by mouth 3 (three)  times daily as needed for cough., Disp: 30 capsule, Rfl: 1 ?  Biotin 2500 MCG CAPS, Take 1 tablet by mouth daily., Disp: , Rfl:  ?  CALCIUM CARBONATE ANTACID PO, Take 1,000 mg by mouth daily as needed for indigestion or heartburn., Disp: , Rfl:  ?  Cholecalciferol (VITAMIN D3) 1000 UNITS CAPS, Take 1,000 Units by mouth daily., Disp: , Rfl:  ?  fluticasone-salmeterol (ADVAIR HFA) 45-21 MCG/ACT inhaler, Inhale 2 puffs into the lungs 2 (two) times daily. LONG ACTING FOR COUGH/WHEEZE, Disp: 1 each, Rfl: 12 ?  levothyroxine (SYNTHROID) 75 MCG tablet, 75 mcg daily., Disp: , Rfl:  ?  mometasone (ELOCON) 0.1 % cream, Apply 1 application topically daily., Disp: 45 g, Rfl: 0 ?  montelukast (SINGULAIR) 10 MG tablet, Take 1 tablet (10 mg total) by mouth at bedtime., Disp: 30 tablet, Rfl: 3 ?  Multiple Vitamin (MULTIVITAMIN) capsule, Take 1 capsule by mouth daily., Disp: , Rfl:  ?  Nutritional Supplements (JOINT FORMULA PO), Take 1 capsule by mouth 1 day or 1 dose., Disp: , Rfl:  ?  sertraline (ZOLOFT) 50 MG tablet, TAKE 1/2 TABLET BY MOUTH EVERY NIGHT FOR 4 NIGHTS THEN INCREASE TO 1 TABLET EVERY NIGHT, Disp: 30 tablet, Rfl: 3 ?  tretinoin (  RETIN-A) 0.05 % cream, Apply pea sized amount to affected area of face nightly as needed., Disp: , Rfl: 5 ? ?Allergies:  ?Allergies  ?Allergen Reactions  ? Diazepam Other (See Comments)  ? Sulfa Antibiotics Hives and Other (See Comments)  ? Cephalexin Rash  ?  Face turned bright red.  ? ? ?Past Medical History, Surgical history, Social history, and Family History were reviewed and updated. ? ?Review of Systems: ?Review of Systems  ?HENT:  Negative.    ?Eyes: Negative.   ?Respiratory:  Positive for shortness of breath.   ?Cardiovascular: Negative.   ?Gastrointestinal:  Positive for abdominal distention.  ?Endocrine: Negative.   ?Musculoskeletal:  Positive for arthralgias, flank pain, myalgias and neck stiffness.  ?Skin: Negative.   ?Neurological: Negative.   ?Hematological: Negative.    ?Psychiatric/Behavioral: Negative.    ? ?Physical Exam: ? weight is 169 lb (76.7 kg). Her oral temperature is 98.7 ?F (37.1 ?C). Her blood pressure is 112/55 (abnormal) and her pulse is 90. Her respiration is 18 and oxygen saturation is 99%.  ? ?Wt Readings from Last 3 Encounters:  ?11/22/21 169 lb (76.7 kg)  ?11/08/21 166 lb 3.2 oz (75.4 kg)  ?10/05/21 166 lb 4.8 oz (75.4 kg)  ? ? ?Physical Exam ?Vitals reviewed.  ?HENT:  ?   Head: Normocephalic and atraumatic.  ?Eyes:  ?   Pupils: Pupils are equal, round, and reactive to light.  ?Cardiovascular:  ?   Rate and Rhythm: Normal rate and regular rhythm.  ?   Heart sounds: Normal heart sounds.  ?Pulmonary:  ?   Effort: Pulmonary effort is normal.  ?   Breath sounds: Normal breath sounds.  ?Abdominal:  ?   General: Bowel sounds are normal.  ?   Palpations: Abdomen is soft.  ?Musculoskeletal:     ?   General: No tenderness or deformity. Normal range of motion.  ?   Cervical back: Normal range of motion.  ?   Comments: Her big toe on her right foot is a little swollen.  There is some erythema associated with this.  She does have decent range of motion.  There is no tenderness to palpation over the area of erythema.  ?Lymphadenopathy:  ?   Cervical: No cervical adenopathy.  ?Skin: ?   General: Skin is warm and dry.  ?   Findings: No erythema or rash.  ?Neurological:  ?   Mental Status: She is alert and oriented to person, place, and time.  ?Psychiatric:     ?   Behavior: Behavior normal.     ?   Thought Content: Thought content normal.     ?   Judgment: Judgment normal.  ? ? ? ?Lab Results  ?Component Value Date  ? WBC 4.2 11/22/2021  ? HGB 12.4 11/22/2021  ? HCT 41.5 11/22/2021  ? MCV 77.6 (L) 11/22/2021  ? PLT 333 11/22/2021  ? ?  Chemistry   ?   ?Component Value Date/Time  ? NA 142 11/22/2021 1006  ? NA 140 07/12/2021 0951  ? K 4.4 11/22/2021 1006  ? CL 107 11/22/2021 1006  ? CO2 27 11/22/2021 1006  ? BUN 18 11/22/2021 1006  ? BUN 14 07/12/2021 0951  ? CREATININE 0.83  11/22/2021 1006  ? CREATININE 0.79 03/10/2014 0836  ?    ?Component Value Date/Time  ? CALCIUM 10.8 (H) 11/22/2021 1006  ? ALKPHOS 112 11/22/2021 1006  ? AST 20 11/22/2021 1006  ? ALT 25 11/22/2021 1006  ? BILITOT 0.7 11/22/2021  1006  ?  ? ? ? ?Impression and Plan: ?Ms. Mannings is a 76 year old white female.  She has polycythemia vera.  So far, she is done very nicely.  Her iron levels are low so we really have not had to phlebotomize her. ? ?Her blood pressure is doing a lot better.  I am sure that the issue with the "asthma" will improve as the pollen count goes down. ? ?I think we can probably get her back after the summer now.  I think she is done that well. ? ?I am just happy that her quality life overall is been doing well.   ? ? ?[name], MD ?4/26/202310:48 AM ?

## 2021-11-30 ENCOUNTER — Telehealth (HOSPITAL_BASED_OUTPATIENT_CLINIC_OR_DEPARTMENT_OTHER): Payer: Self-pay

## 2021-12-01 ENCOUNTER — Telehealth (HOSPITAL_BASED_OUTPATIENT_CLINIC_OR_DEPARTMENT_OTHER): Payer: Self-pay

## 2021-12-01 ENCOUNTER — Other Ambulatory Visit (HOSPITAL_BASED_OUTPATIENT_CLINIC_OR_DEPARTMENT_OTHER): Payer: Self-pay | Admitting: Nurse Practitioner

## 2021-12-01 ENCOUNTER — Encounter (HOSPITAL_BASED_OUTPATIENT_CLINIC_OR_DEPARTMENT_OTHER): Payer: Self-pay | Admitting: Nurse Practitioner

## 2021-12-01 DIAGNOSIS — I10 Essential (primary) hypertension: Secondary | ICD-10-CM

## 2021-12-01 DIAGNOSIS — Z5320 Procedure and treatment not carried out because of patient's decision for unspecified reasons: Secondary | ICD-10-CM

## 2021-12-01 MED ORDER — AMLODIPINE BESYLATE-VALSARTAN 5-320 MG PO TABS: 1.0000 | ORAL_TABLET | Freq: Every day | ORAL | 1 refills | Status: DC

## 2021-12-01 NOTE — Telephone Encounter (Signed)
LVM for pt to cb to sch f/u phone call in 4-6 weeks to check BP ?

## 2021-12-01 NOTE — Telephone Encounter (Signed)
Patient encouraged to monitor BP very closely if stopping medication and if BP raises higher than 130/85 she will need to restart immediately or contact the office.

## 2021-12-07 ENCOUNTER — Other Ambulatory Visit (HOSPITAL_BASED_OUTPATIENT_CLINIC_OR_DEPARTMENT_OTHER): Payer: Self-pay

## 2021-12-07 DIAGNOSIS — R052 Subacute cough: Secondary | ICD-10-CM

## 2021-12-13 ENCOUNTER — Other Ambulatory Visit (HOSPITAL_BASED_OUTPATIENT_CLINIC_OR_DEPARTMENT_OTHER): Payer: Self-pay

## 2021-12-13 DIAGNOSIS — I10 Essential (primary) hypertension: Secondary | ICD-10-CM

## 2021-12-13 MED ORDER — AMLODIPINE BESYLATE-VALSARTAN 5-320 MG PO TABS: 1.0000 | ORAL_TABLET | Freq: Every day | ORAL | 3 refills | Status: DC

## 2021-12-15 ENCOUNTER — Ambulatory Visit: Payer: Medicare Other | Admitting: Pulmonary Disease

## 2021-12-15 ENCOUNTER — Encounter: Payer: Self-pay | Admitting: Pulmonary Disease

## 2021-12-15 ENCOUNTER — Ambulatory Visit (INDEPENDENT_AMBULATORY_CARE_PROVIDER_SITE_OTHER): Payer: Medicare Other

## 2021-12-15 VITALS — BP 130/78 | HR 101 | Ht 62.0 in | Wt 169.2 lb

## 2021-12-15 DIAGNOSIS — R052 Subacute cough: Secondary | ICD-10-CM

## 2021-12-15 DIAGNOSIS — R059 Cough, unspecified: Secondary | ICD-10-CM | POA: Diagnosis not present

## 2021-12-15 NOTE — Progress Notes (Signed)
es  [facility]  Chief Complaint  Patient presents with   Consult    Productive phlegm since early February 2023     Past Surgical History:  She  has a past surgical history that includes dilatation and curettage (07/30/1980); Abdominal hysterectomy (07/30/1980); Augmentation mammaplasty (07/31/1975); Cosmetic surgery (11/28/2011); Colonoscopy with propofol (N/A, 10/29/2013); Tonsillectomy; Colonoscopy with propofol (N/A, 12/22/2014); Hot hemostasis (N/A, 12/22/2014); and Breast implant exchange (10/2020).  Past Medical History:  Polycythemia vera, GERD, Hypothyroidism, Osteopenia, Pneumonia as a teenager  Constitutional:  BP 130/78 (BP Location: Left Arm, Cuff Size: Normal)   Pulse (!) 101   Ht '5\' 2"'$  (1.575 m)   Wt 169 lb 3.2 oz (76.7 kg)   LMP 07/30/1980   SpO2 94%   BMI 30.95 kg/m   Brief Summary:  Heather Brown is a 76 y.o. female with cough.      Subjective:   She is followed by Dr. Marin Olp for polycythemia vera.  She saw him in January.  She was going out of town and needed a new blood pressure medicine.  She was prescribed amlodipine-benazepril.  She started getting a cough after this.   Later in February she started getting more allergies.  She has sinus and ear congestion with post nasal drip.  She will cough up thick phlegm.  This was worse when she laid flat.  She wasn't having chest congestion or wheezing from her chest.  She never smoked and no history of asthma.  Not having reflux.  No skin rashes, fever, or hemoptysis.  She was treated with zithromax.  She was prescribed singulair, advair, and albuterol but never used these.  She as using flonase and then switched to azelastine.  She stopped taking benazepril about two weeks ago.  Her cough and sinus congestion are much better.  Cough started in February.  Treated with zithromax, singulair, azelastine, flonase, advair and albuterol.  She recently change blood  pressure medicine and stopped benazepril.  Physical Exam:   Appearance - well kempt   ENMT - no sinus tenderness, no oral exudate, no LAN, Mallampati 4 airway, no stridor  Respiratory - equal breath sounds bilaterally, no wheezing or rales  CV - s1s2 regular rate and rhythm, no murmurs  Ext - no clubbing, no edema  Skin - no rashes  Psych - normal mood and affect   Pulmonary testing:    Social History:  She  reports that she has never smoked. She has never used smokeless tobacco. She reports that she does not currently use alcohol after a past usage of about 1.0 standard drink per week. She reports that she does not use drugs.  Family History:  Her family history includes Asthma in her sister; Cancer in her maternal grandmother and mother; Cervical cancer in her mother; Diabetes in her father; Stomach cancer in her maternal grandmother; Stroke in her father; Thyroid disease in her sister and sister.     Assessment/Plan:   Subacute cough. - likely from allergic rhinitis with post nasal drip and ACE inhibitor cough - much improved - she can use azelastine prn - prn nasal irrigation, salt water gargles - advised her to avoid forcing a cough or clearing her throat - she should sip water when she has an urge to cough - 1 tsp honey prn to help with cough suppression - will have her get a chest xray for completeness; if this is negative, then she doesn't need additional pulmonary follow up  Time Spent Involved in Patient Care on Day of Examination:  50 minutes  Follow up:   Patient Instructions  Chest xray today.  Will call with results and let you know if you need to schedule a follow up appointment.  Medication List:   Allergies as of 12/15/2021       Reactions   Diazepam Other (See Comments)   Sulfa Antibiotics Hives, Other (See Comments)   Cephalexin Rash   Face turned bright red.        Medication List        Accurate as of Dec 15, 2021  5:07 PM. If you  have any questions, ask your nurse or doctor.          STOP taking these medications    Advair HFA 45-21 MCG/ACT inhaler Generic drug: fluticasone-salmeterol Stopped by: Chesley Mires, MD   albuterol 108 (90 Base) MCG/ACT inhaler Commonly known as: VENTOLIN HFA Stopped by: Chesley Mires, MD   benzonatate 200 MG capsule Commonly known as: TESSALON Stopped by: Chesley Mires, MD   CALCIUM CARBONATE ANTACID PO Stopped by: Chesley Mires, MD   montelukast 10 MG tablet Commonly known as: SINGULAIR Stopped by: Chesley Mires, MD   sertraline 50 MG tablet Commonly known as: ZOLOFT Stopped by: Chesley Mires, MD       TAKE these medications    amLODipine-valsartan 5-320 MG tablet Commonly known as: EXFORGE Take 1 tablet by mouth daily.   aspirin 81 MG tablet Take 81 mg by mouth daily.   azelastine 0.1 % nasal spray Commonly known as: ASTELIN Place 2 sprays into both nostrils 2 (two) times daily. Use in each nostril as directed   Biotin 2500 MCG Caps Take 1 tablet by mouth daily.   JOINT FORMULA PO Take 1 capsule by mouth 1 day or 1 dose.   levothyroxine 75 MCG tablet Commonly known as: SYNTHROID 75 mcg daily.   mometasone 0.1 % cream Commonly known as: Elocon Apply 1 application topically daily.   multivitamin capsule Take 1 capsule by mouth daily.   tretinoin 0.05 % cream Commonly known as: RETIN-A Apply pea sized amount to affected area of face nightly as needed.   Vitamin D3 25 MCG (1000 UT) Caps Take 1,000 Units by mouth daily.        Signature:  Chesley Mires, MD [place] Pager - [phone] 12/15/2021, 5:07 PM

## 2021-12-15 NOTE — Patient Instructions (Signed)
Chest xray today.  Will call with results and let you know if you need to schedule a follow up appointment.

## 2021-12-28 ENCOUNTER — Telehealth: Payer: Self-pay | Admitting: Pulmonary Disease

## 2021-12-28 NOTE — Telephone Encounter (Signed)
Called and spoke with patient who would like the results of her CXR  Please advise

## 2022-01-01 NOTE — Telephone Encounter (Signed)
Please let her know that her chest xray shows very mild airway thickening which can be seen after an episode of bronchitis and will usually go away on it's own.    If she is not having any symptoms of coughing, wheeze, or shortness of breath then nothing further is needed and she doesn't need a follow up appointment.    If she is having respiratory symptoms, then she needs an appointment.

## 2022-01-01 NOTE — Telephone Encounter (Signed)
Called nad went over results with patient and she voiced understanding. Nothing further needed at this time.

## 2022-01-03 ENCOUNTER — Emergency Department (HOSPITAL_BASED_OUTPATIENT_CLINIC_OR_DEPARTMENT_OTHER): Payer: Medicare Other

## 2022-01-03 ENCOUNTER — Emergency Department (HOSPITAL_BASED_OUTPATIENT_CLINIC_OR_DEPARTMENT_OTHER)
Admission: EM | Admit: 2022-01-03 | Discharge: 2022-01-03 | Disposition: A | Discharge status: Home / Self Care | Payer: Medicare Other | Attending: Emergency Medicine | Admitting: Emergency Medicine

## 2022-01-03 ENCOUNTER — Encounter (HOSPITAL_BASED_OUTPATIENT_CLINIC_OR_DEPARTMENT_OTHER): Payer: Self-pay | Admitting: Emergency Medicine

## 2022-01-03 ENCOUNTER — Other Ambulatory Visit: Payer: Self-pay

## 2022-01-03 DIAGNOSIS — N200 Calculus of kidney: Secondary | ICD-10-CM | POA: Diagnosis not present

## 2022-01-03 DIAGNOSIS — Z7982 Long term (current) use of aspirin: Secondary | ICD-10-CM | POA: Diagnosis not present

## 2022-01-03 DIAGNOSIS — D649 Anemia, unspecified: Secondary | ICD-10-CM | POA: Diagnosis not present

## 2022-01-03 DIAGNOSIS — Z79899 Other long term (current) drug therapy: Secondary | ICD-10-CM | POA: Diagnosis not present

## 2022-01-03 DIAGNOSIS — K7689 Other specified diseases of liver: Secondary | ICD-10-CM | POA: Diagnosis not present

## 2022-01-03 DIAGNOSIS — I10 Essential (primary) hypertension: Secondary | ICD-10-CM | POA: Diagnosis not present

## 2022-01-03 DIAGNOSIS — E876 Hypokalemia: Secondary | ICD-10-CM | POA: Insufficient documentation

## 2022-01-03 DIAGNOSIS — N3289 Other specified disorders of bladder: Secondary | ICD-10-CM | POA: Diagnosis not present

## 2022-01-03 DIAGNOSIS — R1032 Left lower quadrant pain: Secondary | ICD-10-CM | POA: Diagnosis present

## 2022-01-03 LAB — URINALYSIS, ROUTINE W REFLEX MICROSCOPIC
Bilirubin Urine: NEGATIVE
Glucose, UA: NEGATIVE mg/dL
Ketones, ur: NEGATIVE mg/dL
Leukocytes,Ua: NEGATIVE
Nitrite: NEGATIVE
Protein, ur: NEGATIVE mg/dL
Specific Gravity, Urine: 1.025 (ref 1.005–1.030)
pH: 5.5 (ref 5.0–8.0)

## 2022-01-03 LAB — COMPREHENSIVE METABOLIC PANEL
ALT: 28 U/L (ref 0–44)
AST: 26 U/L (ref 15–41)
Albumin: 3.6 g/dL (ref 3.5–5.0)
Alkaline Phosphatase: 118 U/L (ref 38–126)
Anion gap: 7 (ref 5–15)
BUN: 16 mg/dL (ref 8–23)
CO2: 22 mmol/L (ref 22–32)
Calcium: 9.4 mg/dL (ref 8.9–10.3)
Chloride: 111 mmol/L (ref 98–111)
Creatinine, Ser: 0.97 mg/dL (ref 0.44–1.00)
GFR, Estimated: >60 mL/min (ref >60)
Glucose, Bld: 111 mg/dL — ABNORMAL HIGH (ref 70–99)
Potassium: 3.4 mmol/L — ABNORMAL LOW (ref 3.5–5.1)
Sodium: 140 mmol/L (ref 135–145)
Total Bilirubin: 0.9 mg/dL (ref 0.3–1.2)
Total Protein: 6.9 g/dL (ref 6.5–8.1)

## 2022-01-03 LAB — CBC
HCT: 38.3 % (ref 36.0–46.0)
Hemoglobin: 11.7 g/dL — ABNORMAL LOW (ref 12.0–15.0)
MCH: 22.9 pg — ABNORMAL LOW (ref 26.0–34.0)
MCHC: 30.5 g/dL (ref 30.0–36.0)
MCV: 75.1 fL — ABNORMAL LOW (ref 80.0–100.0)
Platelets: 304 10*3/uL (ref 150–400)
RBC: 5.1 MIL/uL (ref 3.87–5.11)
RDW: 16.3 % — ABNORMAL HIGH (ref 11.5–15.5)
WBC: 6.3 10*3/uL (ref 4.0–10.5)
nRBC: 0 % (ref 0.0–0.2)

## 2022-01-03 LAB — LIPASE, BLOOD: Lipase: 26 U/L (ref 11–51)

## 2022-01-03 LAB — URINALYSIS, MICROSCOPIC (REFLEX)

## 2022-01-03 MED ORDER — IOHEXOL 300 MG/ML  SOLN
100.0000 mL | Freq: Once | INTRAMUSCULAR | Status: AC | PRN
  Administered 2022-01-03: 100 mL via INTRAVENOUS

## 2022-01-03 MED ORDER — MORPHINE SULFATE (PF) 2 MG/ML IV SOLN
2.0000 mg | INTRAVENOUS | Status: DC | PRN
  Administered 2022-01-03: 2 mg via INTRAVENOUS
  Filled 2022-01-03: qty 1

## 2022-01-03 MED ORDER — OXYCODONE HCL 5 MG PO TABS: 5.0000 mg | ORAL_TABLET | ORAL | 0 refills | Status: DC | PRN

## 2022-01-03 MED ORDER — ONDANSETRON HCL 4 MG/2ML IJ SOLN
4.0000 mg | Freq: Four times a day (QID) | INTRAMUSCULAR | Status: DC | PRN
Start: 2022-01-03 — End: 2022-01-04
  Administered 2022-01-03: 4 mg via INTRAVENOUS
  Filled 2022-01-03: qty 2

## 2022-01-03 MED ORDER — TAMSULOSIN HCL 0.4 MG PO CAPS: 0.4000 mg | ORAL_CAPSULE | Freq: Every day | ORAL | 0 refills | Status: DC

## 2022-01-03 MED ORDER — ONDANSETRON HCL 4 MG PO TABS: 4.0000 mg | ORAL_TABLET | Freq: Four times a day (QID) | ORAL | 0 refills | Status: DC

## 2022-01-03 NOTE — ED Notes (Signed)
Patient provided strainer for home use. Instructed on how use device. Patient acknowledges.

## 2022-01-03 NOTE — ED Provider Notes (Signed)
[facility] Provider Note   CSN: [id number] Arrival date & time: 01/03/22  1655     History  Chief Complaint  Patient presents with   Abdominal Pain    Heather Brown is a 76 y.o. female with a past medical history significant for polycythemia vera, hypertension, anxiety, acid reflux who presents with sudden onset left lower quadrant pain with 4 episodes of vomiting that started today.  Patient reports that she had been feeling a little unwell earlier this week with several episodes of diarrhea on Monday, felt mostly better yesterday.  Patient reports that she went out to eat for lunch, and about an hour after her lunch began having the pain and vomiting.  She rated her pain 8/10 on arrival.  At time my evaluation she reports the pain has somewhat improved but is still present, she is not experiencing any nausea at the moment.   Abdominal Pain Associated symptoms: nausea and vomiting       Home Medications Prior to Admission medications   Medication Sig Start Date End Date Taking? Authorizing Provider  ondansetron (ZOFRAN) 4 MG tablet Take 1 tablet (4 mg total) by mouth every 6 (six) hours. 01/03/22  Yes Brennen Gardiner H, PA-C  oxyCODONE (ROXICODONE) 5 MG immediate release tablet Take 1 tablet (5 mg total) by mouth every 4 (four) hours as needed for severe pain. 01/03/22  Yes Loring Liskey H, PA-C  tamsulosin (FLOMAX) 0.4 MG CAPS capsule Take 1 capsule (0.4 mg total) by mouth daily. 01/03/22  Yes Gloris Shiroma H, PA-C  amLODipine-valsartan (EXFORGE) 5-320 MG tablet Take 1 tablet by mouth daily. 12/13/21   Orma Render, NP  aspirin 81 MG tablet Take 81 mg by mouth daily.    [provider]  azelastine (ASTELIN) 0.1 % nasal spray Place 2 sprays into both nostrils 2 (two) times daily. Use in each nostril as directed 10/05/21   Early, Coralee Pesa, NP  Biotin 2500 MCG CAPS Take 1 tablet by mouth daily.    [provider]   Cholecalciferol (VITAMIN D3) 1000 UNITS CAPS Take 1,000 Units by mouth daily.    [provider]  levothyroxine (SYNTHROID) 75 MCG tablet 75 mcg daily. 04/08/19   [provider]  mometasone (ELOCON) 0.1 % cream Apply 1 application topically daily. 02/22/21   [name], MD  Multiple Vitamin (MULTIVITAMIN) capsule Take 1 capsule by mouth daily.    [provider]  Nutritional Supplements (JOINT FORMULA PO) Take 1 capsule by mouth 1 day or 1 dose.    [provider]  tretinoin (RETIN-A) 0.05 % cream Apply pea sized amount to affected area of face nightly as needed. 01/09/18   [provider]      Allergies    Diazepam, Sulfa antibiotics, and Cephalexin    Review of Systems   Review of Systems  Gastrointestinal:  Positive for abdominal pain, nausea and vomiting.  All other systems reviewed and are negative.  Physical Exam Updated Vital Signs BP 126/74 (BP Location: Right Arm)   Pulse 86   Temp 98.7 F (37.1 C) (Oral)   Resp 18   Ht '5\' 2"'$  (1.575 m)   Wt 74.4 kg   LMP 07/30/1980   SpO2 92%   BMI 30.00 kg/m  Physical Exam Vitals and nursing note reviewed.  Constitutional:      General: She is not in acute distress.    Appearance: Normal appearance.  HENT:     Head: Normocephalic  and atraumatic.  Eyes:     General:        Right eye: No discharge.        Left eye: No discharge.  Cardiovascular:     Rate and Rhythm: Normal rate and regular rhythm.     Heart sounds: No murmur heard.   No friction rub. No gallop.  Pulmonary:     Effort: Pulmonary effort is normal.     Breath sounds: Normal breath sounds.  Abdominal:     General: Bowel sounds are normal.     Palpations: Abdomen is soft.     Comments: Patient with some tenderness palpation left lower quadrant without significant rebound, rigidity, guarding.  No distention noted.  Normal bowel sounds throughout.  Skin:    General: Skin is warm and dry.     Capillary Refill:  Capillary refill takes less than 2 seconds.  Neurological:     Mental Status: She is alert and oriented to person, place, and time.  Psychiatric:        Mood and Affect: Mood normal.        Behavior: Behavior normal.    ED Results / Procedures / Treatments   Labs (all labs ordered are listed, but only abnormal results are displayed) Labs Reviewed  COMPREHENSIVE METABOLIC PANEL - Abnormal; Notable for the following components:      Result Value   Potassium 3.4 (*)    Glucose, Bld 111 (*)    All other components within normal limits  CBC - Abnormal; Notable for the following components:   Hemoglobin 11.7 (*)    MCV 75.1 (*)    MCH 22.9 (*)    RDW 16.3 (*)    All other components within normal limits  URINALYSIS, ROUTINE W REFLEX MICROSCOPIC - Abnormal; Notable for the following components:   Hgb urine dipstick SMALL (*)    All other components within normal limits  URINALYSIS, MICROSCOPIC (REFLEX) - Abnormal; Notable for the following components:   Bacteria, UA RARE (*)    All other components within normal limits  LIPASE, BLOOD    EKG None  Radiology CT ABDOMEN PELVIS W CONTRAST  Result Date: 01/03/2022 CLINICAL DATA:  Sudden onset left lower quadrant abdominal pain and vomiting after eating a big lunch. EXAM: CT ABDOMEN AND PELVIS WITH CONTRAST TECHNIQUE: Multidetector CT imaging of the abdomen and pelvis was performed using the standard protocol following bolus administration of intravenous contrast. RADIATION DOSE REDUCTION: This exam was performed according to the departmental dose-optimization program which includes automated exposure control, adjustment of the mA and/or kV according to patient size and/or use of iterative reconstruction technique. CONTRAST:  194m OMNIPAQUE IOHEXOL 300 MG/ML  SOLN COMPARISON:  Abdominal ultrasound dated September 25, 2016. FINDINGS: Lower chest: No acute abnormality. Hepatobiliary: 1.0 cm simple cyst in the medial tip of the left liver. No  other focal liver abnormality. Gallbladder fundal adenomyomatosis. No gallstones, gallbladder wall thickening, or biliary dilatation. Pancreas: 1.6 x 2.7 cm thin walled cystic lesion in the uncinate process of the pancreas without mural nodule or peripheral calcification (series 2, image 33). No ductal dilatation or peripancreatic inflammatory change. Spleen: Normal in size without focal abnormality. Adrenals/Urinary Tract: Adrenal glands are unremarkable. No renal mass. 2 mm calculus at the left UVJ (series 5, image 43). No hydronephrosis. Bladder is mostly decompressed. Stomach/Bowel: Stomach is within normal limits. Appendix appears normal. No evidence of bowel wall thickening, distention, or inflammatory changes. Vascular/Lymphatic: Aortic atherosclerosis. No enlarged abdominal or pelvic lymph nodes.  Reproductive: Status post hysterectomy. No adnexal masses. Other: No abdominal wall hernia or abnormality. No abdominopelvic ascites. No pneumoperitoneum. Musculoskeletal: No acute or significant osseous findings. IMPRESSION: 1. 2 mm calculus at the left UVJ. No hydronephrosis. 2. 2.7 cm cystic lesion in the uncinate process of the pancreas with low risk imaging features. Recommend follow-up pancreatic protocol CT or MRI with and without contrast in 6 months. 3. Aortic Atherosclerosis (ICD10-I70.0). Electronically Signed   By: Titus Dubin M.D.   On: 01/03/2022 19:03    Procedures Procedures    Medications Ordered in ED Medications  ondansetron (ZOFRAN) injection 4 mg (4 mg Intravenous Given 01/03/22 1848)  morphine (PF) 2 MG/ML injection 2 mg (2 mg Intravenous Given 01/03/22 1847)  iohexol (OMNIPAQUE) 300 MG/ML solution 100 mL (100 mLs Intravenous Contrast Given 01/03/22 1823)    ED Course/ Medical Decision Making/ A&P                           Medical Decision Making Amount and/or Complexity of Data Reviewed Labs: ordered.   This patient is a 76 y.o. female who presents to the ED for concern of  acute onset left sided flank pain, this involves an extensive number of treatment options, and is a complaint that carries with it a high risk of complications and morbidity. The emergent differential diagnosis prior to evaluation includes, but is not limited to,  nephrolithiasis, pyelonephritis, diverticulitis, gastroenteritis, gastritis, msk injury, vs other.   This is not an exhaustive differential.   Past Medical History / Co-morbidities / Social History: polycythemia vera, hypertension, anxiety, acid reflu  Additional history: Chart reviewed. Pertinent results include: reviewed outpatient family medicine notes, oncology notes for polycythemia vera treatment  Physical Exam: Physical exam performed. The pertinent findings include: TTP in left flank, and suprapubic. No rebound, rigidity, guarding.  Lab Tests: I ordered, and personally interpreted labs.  The pertinent results include:  cbc with mild anemia, hemoglobin 11.7. Lipase unremarkable. CMP unremarkable very mild hypokalemia. UA shows minimal hgb, no evidence of acute UTI. Ca Oxalate crystals are presents.    Imaging Studies: I ordered imaging studies including CT stone study. I independently visualized and interpreted imaging which showed 2 mm stone in the left ureter at the UVJ, no significant hydronephrosis. I agree with the radiologist interpretation.   Medications: I ordered medication including morphine for pain, Zofran for nausea. Reevaluation of the patient after these medicines showed that the patient improved. I have reviewed the patients home medicines and have made adjustments as needed.   Disposition: After consideration of the diagnostic results and the patients response to treatment, I feel that patient with improvement of pain, her clinical condition is consistent with a kidney stone, discharged her with kidney stone medications including oxycodone, Zofran, and Flomax and encouraged follow-up with urology.   I  discussed this case with my attending physician Dr. Kathrynn Humble who cosigned this note including patient's presenting symptoms, physical exam, and planned diagnostics and interventions. Attending physician stated agreement with plan or made changes to plan which were implemented.    Final Clinical Impression(s) / ED Diagnoses Final diagnoses:  Kidney stone    Rx / DC Orders ED Discharge Orders          Ordered    oxyCODONE (ROXICODONE) 5 MG immediate release tablet  Every 4 hours PRN        01/03/22 1919    ondansetron (ZOFRAN) 4 MG tablet  Every 6 hours  01/03/22 1919    tamsulosin (FLOMAX) 0.4 MG CAPS capsule  Daily        01/03/22 1919              Dorien Chihuahua 01/03/22 Mason Jim, MD 01/11/22 [phone]

## 2022-01-03 NOTE — ED Triage Notes (Addendum)
Pt reports LLQ pain and vomiting; sudden onset at 1500 after eating a big lunch

## 2022-01-03 NOTE — Discharge Instructions (Addendum)
You were seen in the emergency department and found to have a kidney stone.  Please take ibuprofen as needed for pain.  We are sending you home with multiple medications to assist with passing the stone and for residual pain/nausea:  -Flomax-this is a medication to help pass the stone, it allows urine to exit the body more freely.  Please take this once daily with a meal.  -Oxicodone-this is a narcotic/controlled substance medication that has potential addicting qualities.  We recommend that you take 1-2 tablets every 6 hours as needed for severe pain.  Do not drive or operate heavy machinery when taking this medicine as it can be sedating. Do not drink alcohol or take other sedating medications when taking this medicine for safety reasons.  Keep this out of reach of small children.    -Zofran-this is an antinausea medication, you may take this every 8 hours as needed for nausea and vomiting, please allow the tablet to dissolve underneath of your tongue.   We have prescribed you new medication(s) today. Discuss the medications prescribed today with your pharmacist as they can have adverse effects and interactions with your other medicines including over the counter and prescribed medications. Seek medical evaluation if you start to experience new or abnormal symptoms after taking one of these medicines, seek care immediately if you start to experience difficulty breathing, feeling of your throat closing, facial swelling, or rash as these could be indications of a more serious allergic reaction  Please follow-up with the urology group provided in your discharge instructions within 3 to 5 days.  Return to the ER for new or worsening symptoms including but not limited to worsening pain not controlled by these medicines, inability to keep fluids down, fever, or any other concerns that you may have.

## 2022-03-14 DIAGNOSIS — L309 Dermatitis, unspecified: Secondary | ICD-10-CM | POA: Diagnosis not present

## 2022-03-14 DIAGNOSIS — L821 Other seborrheic keratosis: Secondary | ICD-10-CM | POA: Diagnosis not present

## 2022-03-14 DIAGNOSIS — D225 Melanocytic nevi of trunk: Secondary | ICD-10-CM | POA: Diagnosis not present

## 2022-04-09 ENCOUNTER — Other Ambulatory Visit (HOSPITAL_BASED_OUTPATIENT_CLINIC_OR_DEPARTMENT_OTHER): Payer: Self-pay | Admitting: Nurse Practitioner

## 2022-04-09 DIAGNOSIS — I10 Essential (primary) hypertension: Secondary | ICD-10-CM

## 2022-04-09 DIAGNOSIS — F411 Generalized anxiety disorder: Secondary | ICD-10-CM

## 2022-05-21 DIAGNOSIS — M25572 Pain in left ankle and joints of left foot: Secondary | ICD-10-CM | POA: Diagnosis not present

## 2022-05-30 LAB — HM DEXA SCAN

## 2022-05-31 DIAGNOSIS — M76822 Posterior tibial tendinitis, left leg: Secondary | ICD-10-CM | POA: Diagnosis not present

## 2022-05-31 DIAGNOSIS — M25572 Pain in left ankle and joints of left foot: Secondary | ICD-10-CM | POA: Diagnosis not present

## 2022-06-05 ENCOUNTER — Ambulatory Visit: Payer: Medicare Other | Admitting: Orthopaedic Surgery

## 2022-06-26 ENCOUNTER — Telehealth: Payer: Self-pay | Admitting: Nurse Practitioner

## 2022-06-26 ENCOUNTER — Telehealth (INDEPENDENT_AMBULATORY_CARE_PROVIDER_SITE_OTHER): Payer: Medicare Other | Admitting: Nurse Practitioner

## 2022-06-26 ENCOUNTER — Encounter: Payer: Self-pay | Admitting: Nurse Practitioner

## 2022-06-26 VITALS — BP 113/80 | HR 81 | Ht 62.0 in | Wt 155.0 lb

## 2022-06-26 DIAGNOSIS — U071 COVID-19: Secondary | ICD-10-CM | POA: Diagnosis not present

## 2022-06-26 DIAGNOSIS — I10 Essential (primary) hypertension: Secondary | ICD-10-CM | POA: Diagnosis not present

## 2022-06-26 HISTORY — DX: COVID-19: U07.1

## 2022-06-26 NOTE — Assessment & Plan Note (Signed)
Chronic. Not well controlled at this time per home readings. Patient encouraged to restart amlodipine-valsartan that was started in May of this year. No alarm sx are present at this time. Discussion on the risks of elevated BP on overall health and importance of medication management. She expressed understanding. F/U in 2-3 months for labs.

## 2022-06-26 NOTE — Assessment & Plan Note (Signed)
Positive COVID test with symptom onset about 11 days ago. At this time symptoms do seem to be improving with no significant concerning findings. Recommend continue with supportive care, she is unfortunately out of the window for Paxlovid, but is doing well, which is reassuring. She is aware to contact the office if her symptoms worsen or new symptoms develop. At this time, discussed with patient that she may safely leave the house, but recommend masking for her protection against secondary infection given her recent illness. She expressed understanding. F/U PRN

## 2022-06-26 NOTE — Progress Notes (Signed)
Virtual Visit Encounter mychart visit.- CONVERTED TO PHONE VISIT DUE TO TECHNICAL DIFFICULTIES   I connected with  Heather Brown on 06/26/22 at  8:45 AM EST by secure video and audio telemedicine application- concerted to telephone only. I verified that I am speaking with the correct person using two identifiers.   I introduced myself as a Designer, jewellery with the practice. The limitations of evaluation and management by telemedicine discussed with the patient and the availability of in person appointments. The patient expressed verbal understanding and consent to proceed.  Participating parties in this visit include: Myself and patient  The patient is: Patient Location: Home I am: Provider Location: Office/Clinic Subjective:    CC and HPI: Heather Brown is a 76 y.o. year old female presenting for new evaluation and treatment of COVID positive. Patient reports the following: COVID Heather Brown tells me that she started to develop symptoms of fatigue, cough, and generalized feeling of unwell on Saturday of last week while she was traveling with family. She tells me that on Tuesday she went straight to bed after taking her husband to the doctor, which is unusual. She tells me that she tested on Wednesday afternoon and Thursday morning after learning that her BIL and SIL were positive for COVID and she had been with them, her tests were positive. She reports that she initially experienced fevers, chills, body aches, headache, and extreme fatigue along with congestion. She reports that all of these symptoms have resolved with exception of fatigue and some congestion. Her main concern is whether she can safely go out of the house to resume normal activities.   HTN She also tells me that she has not been taking her BP medication. She really prefers not to take any medications if possible. She has been monitoring her BP and reports that it is consistently in the 130's-150's/80's. She would  like to know if she should restart her medication.   Past medical history, Surgical history, Family history not pertinant except as noted below, Social history, Allergies, and medications have been entered into the medical record, reviewed, and corrections made.   Review of Systems:  All review of systems negative except what is listed in the HPI  Objective:    Alert and oriented x 4 Audibly congested.  Speaking in clear sentences with no shortness of breath. No distress.  Impression and Recommendations:    Problem List Items Addressed This Visit     Primary hypertension    Chronic. Not well controlled at this time per home readings. Patient encouraged to restart amlodipine-valsartan that was started in May of this year. No alarm sx are present at this time. Discussion on the risks of elevated BP on overall health and importance of medication management. She expressed understanding. F/U in 2-3 months for labs.       COVID-19 - Primary    Positive COVID test with symptom onset about 11 days ago. At this time symptoms do seem to be improving with no significant concerning findings. Recommend continue with supportive care, she is unfortunately out of the window for Paxlovid, but is doing well, which is reassuring. She is aware to contact the office if her symptoms worsen or new symptoms develop. At this time, discussed with patient that she may safely leave the house, but recommend masking for her protection against secondary infection given her recent illness. She expressed understanding. F/U PRN       current treatment plan is effective, no change in therapy I  discussed the assessment and treatment plan with the patient. The patient was provided an opportunity to ask questions and all were answered. The patient agreed with the plan and demonstrated an understanding of the instructions.   The patient was advised to call back or seek an in-person evaluation if the symptoms worsen or if  the condition fails to improve as anticipated.  Follow-Up: prn  I provided 35 minutes of non-face-to-face interaction with this non face-to-face encounter including intake, same-day documentation, and chart review.   Heather Render, NP , DNP, AGNP-c [place] [facility] [phone] (fax)

## 2022-06-26 NOTE — Telephone Encounter (Signed)
Pt called back and states that the name of the medication on the bottle that she has is amlodipine-benaz. She is not sure if it is the same as what you mentioned in your virtual appt. Please advise pt at [phone].  Pt uses [facility] at [address] and [address].

## 2022-06-26 NOTE — Patient Instructions (Signed)
Please let me know if you are out of the [place]. I would like you to start taking that again to help with your blood pressure. Your blood pressure goals are less than 130/80 for best control.

## 2022-06-28 ENCOUNTER — Telehealth: Payer: Self-pay | Admitting: Nurse Practitioner

## 2022-06-28 NOTE — Telephone Encounter (Signed)
Pt called back @ her BP medicine, said went to pharmacy & it wasn't there.  Also she states it's amlodipine valsartan NOT amlodipine benazepril that she needs.  BP today 167/88

## 2022-06-29 MED ORDER — AMLODIPINE BESYLATE-VALSARTAN 5-320 MG PO TABS: 1.0000 | ORAL_TABLET | Freq: Every day | ORAL | 3 refills | Status: DC

## 2022-06-29 NOTE — Telephone Encounter (Signed)
done

## 2022-06-29 NOTE — Addendum Note (Signed)
Addended by: Anaijah Augsburger, Clarise Cruz E on: 06/29/2022 07:17 AM   Modules accepted: Orders

## 2022-07-05 ENCOUNTER — Encounter: Payer: Self-pay | Admitting: Hematology & Oncology

## 2022-07-05 ENCOUNTER — Other Ambulatory Visit: Payer: Self-pay

## 2022-07-05 ENCOUNTER — Inpatient Hospital Stay: Payer: Medicare Other | Attending: Hematology & Oncology

## 2022-07-05 ENCOUNTER — Inpatient Hospital Stay: Payer: Medicare Other | Admitting: Hematology & Oncology

## 2022-07-05 VITALS — BP 128/68 | HR 83 | Temp 98.5°F | Resp 18 | Ht 62.0 in | Wt 159.1 lb

## 2022-07-05 DIAGNOSIS — D45 Polycythemia vera: Secondary | ICD-10-CM

## 2022-07-05 DIAGNOSIS — Z79899 Other long term (current) drug therapy: Secondary | ICD-10-CM | POA: Insufficient documentation

## 2022-07-05 DIAGNOSIS — G7000 Myasthenia gravis without (acute) exacerbation: Secondary | ICD-10-CM | POA: Diagnosis not present

## 2022-07-05 DIAGNOSIS — Z7982 Long term (current) use of aspirin: Secondary | ICD-10-CM | POA: Insufficient documentation

## 2022-07-05 LAB — IRON AND IRON BINDING CAPACITY (CC-WL,HP ONLY)
Iron: 42 ug/dL (ref 28–170)
Saturation Ratios: 9 % — ABNORMAL LOW (ref 10.4–31.8)
TIBC: 465 ug/dL — ABNORMAL HIGH (ref 250–450)
UIBC: 423 ug/dL (ref 148–442)

## 2022-07-05 LAB — CBC WITH DIFFERENTIAL (CANCER CENTER ONLY)
Abs Immature Granulocytes: 0.01 10*3/uL (ref 0.00–0.07)
Basophils Absolute: 0 10*3/uL (ref 0.0–0.1)
Basophils Relative: 1 %
Eosinophils Absolute: 0.1 10*3/uL (ref 0.0–0.5)
Eosinophils Relative: 3 %
HCT: 43.1 % (ref 36.0–46.0)
Hemoglobin: 13.1 g/dL (ref 12.0–15.0)
Immature Granulocytes: 0 %
Lymphocytes Relative: 33 %
Lymphs Abs: 1.3 10*3/uL (ref 0.7–4.0)
MCH: 24.1 pg — ABNORMAL LOW (ref 26.0–34.0)
MCHC: 30.4 g/dL (ref 30.0–36.0)
MCV: 79.4 fL — ABNORMAL LOW (ref 80.0–100.0)
Monocytes Absolute: 0.5 10*3/uL (ref 0.1–1.0)
Monocytes Relative: 13 %
Neutro Abs: 1.9 10*3/uL (ref 1.7–7.7)
Neutrophils Relative %: 50 %
Platelet Count: 304 10*3/uL (ref 150–400)
RBC: 5.43 MIL/uL — ABNORMAL HIGH (ref 3.87–5.11)
RDW: 15.8 % — ABNORMAL HIGH (ref 11.5–15.5)
WBC Count: 3.9 10*3/uL — ABNORMAL LOW (ref 4.0–10.5)
nRBC: 0 % (ref 0.0–0.2)

## 2022-07-05 LAB — CMP (CANCER CENTER ONLY)
ALT: 20 U/L (ref 0–44)
AST: 21 U/L (ref 15–41)
Albumin: 4.3 g/dL (ref 3.5–5.0)
Alkaline Phosphatase: 108 U/L (ref 38–126)
Anion gap: 7 (ref 5–15)
BUN: 14 mg/dL (ref 8–23)
CO2: 28 mmol/L (ref 22–32)
Calcium: 11.1 mg/dL — ABNORMAL HIGH (ref 8.9–10.3)
Chloride: 108 mmol/L (ref 98–111)
Creatinine: 0.81 mg/dL (ref 0.44–1.00)
GFR, Estimated: >60 mL/min (ref >60)
Glucose, Bld: 66 mg/dL — ABNORMAL LOW (ref 70–99)
Potassium: 4.1 mmol/L (ref 3.5–5.1)
Sodium: 143 mmol/L (ref 135–145)
Total Bilirubin: 0.8 mg/dL (ref 0.3–1.2)
Total Protein: 7.1 g/dL (ref 6.5–8.1)

## 2022-07-05 LAB — LACTATE DEHYDROGENASE: LDH: 138 U/L (ref 98–192)

## 2022-07-05 LAB — FERRITIN: Ferritin: 7 ng/mL — ABNORMAL LOW (ref 11–307)

## 2022-07-05 NOTE — Progress Notes (Signed)
Hematology and Oncology Follow Up Visit  Heather Brown [id number] [birth year] 76 y.o. 07/05/2022   Principle Diagnosis:  Polycythemia vera -- DNMT3A (+),  Epo = 5.7  Current Therapy:   Phlebotomy to maintain hematocrit below 45% Aspirin 81 mg p.o. daily     Interim History:  Heather Brown is back for follow-up.  Overall, she is doing quite well.  We last saw her back in April.  She and her husband really stayed home quite a bit.  He has his state of health issues.  She was make sure that he is okay.  He has myasthenia gravis.  He has cardiac issues.  She is doing okay.  She subsequently said she fell off the bed yesterday.  She misjudged the distance.  Over the last saw her, she had low iron.  However, this is clearly but would like with respect to her polycythemia.  Her ferritin was 5 and iron saturation was 7%.  She has had no issues with nausea or vomiting.  She has had no problems with cough although she had COVID at Thanksgiving.  She has had no problems with bowels or bladder.  There is been no leg swelling.  She has had no rashes.  She is worried about her blood pressure.  She came in with a measurement of blood pressure for several weeks.  I did not think that her blood pressure was all that bad.  Overall, I would say that her performance status is probably ECOG 0.  .      Medications:  Current Outpatient Medications:    aspirin 81 MG tablet, Take 81 mg by mouth daily., Disp: , Rfl:    azelastine (ASTELIN) 0.1 % nasal spray, Place 2 sprays into both nostrils 2 (two) times daily. Use in each nostril as directed, Disp: 30 mL, Rfl: 6   Biotin 2500 MCG CAPS, Take 1 tablet by mouth daily., Disp: , Rfl:    Cholecalciferol (VITAMIN D3) 1000 UNITS CAPS, Take 1,000 Units by mouth daily., Disp: , Rfl:    levothyroxine (SYNTHROID) 75 MCG tablet, 75 mcg daily., Disp: , Rfl:    loratadine (CLARITIN) 10 MG tablet, Take by mouth at bedtime., Disp: , Rfl:    Multiple Vitamin  (MULTIVITAMIN) capsule, Take 1 capsule by mouth daily., Disp: , Rfl:    Nutritional Supplements (JOINT FORMULA PO), Take 1 capsule by mouth 1 day or 1 dose., Disp: , Rfl:    tretinoin (RETIN-A) 0.05 % cream, Apply pea sized amount to affected area of face nightly as needed., Disp: , Rfl: 5  Allergies:  Allergies  Allergen Reactions   Sulfa Antibiotics Hives   Cephalexin Rash    Face turned bright red.   Diazepam Other (See Comments)    Past Medical History, Surgical history, Social history, and Family History were reviewed and updated.  Review of Systems: Review of Systems  HENT:  Negative.    Eyes: Negative.   Respiratory:  Positive for shortness of breath.   Cardiovascular: Negative.   Gastrointestinal:  Positive for abdominal distention.  Endocrine: Negative.   Musculoskeletal:  Positive for arthralgias, flank pain, myalgias and neck stiffness.  Skin: Negative.   Neurological: Negative.   Hematological: Negative.   Psychiatric/Behavioral: Negative.      Physical Exam:  height is '5\' 2"'$  (1.575 m) and weight is 159 lb 1.3 oz (72.2 kg). Her oral temperature is 98.5 F (36.9 C). Her blood pressure is 128/68 and her pulse is 83. Her respiration is 18  and oxygen saturation is 100%.   Wt Readings from Last 3 Encounters:  07/05/22 159 lb 1.3 oz (72.2 kg)  06/26/22 155 lb (70.3 kg)  01/03/22 164 lb (74.4 kg)    Physical Exam Vitals reviewed.  HENT:     Head: Normocephalic and atraumatic.  Eyes:     Pupils: Pupils are equal, round, and reactive to light.  Cardiovascular:     Rate and Rhythm: Normal rate and regular rhythm.     Heart sounds: Normal heart sounds.  Pulmonary:     Effort: Pulmonary effort is normal.     Breath sounds: Normal breath sounds.  Abdominal:     General: Bowel sounds are normal.     Palpations: Abdomen is soft.  Musculoskeletal:        General: No tenderness or deformity. Normal range of motion.     Cervical back: Normal range of motion.      Comments: Her big toe on her right foot is a little swollen.  There is some erythema associated with this.  She does have decent range of motion.  There is no tenderness to palpation over the area of erythema.  Lymphadenopathy:     Cervical: No cervical adenopathy.  Skin:    General: Skin is warm and dry.     Findings: No erythema or rash.  Neurological:     Mental Status: She is alert and oriented to person, place, and time.  Psychiatric:        Behavior: Behavior normal.        Thought Content: Thought content normal.        Judgment: Judgment normal.      Lab Results  Component Value Date   WBC 3.9 (L) 07/05/2022   HGB 13.1 07/05/2022   HCT 43.1 07/05/2022   MCV 79.4 (L) 07/05/2022   PLT 304 07/05/2022     Chemistry      Component Value Date/Time   NA 143 07/05/2022 1129   NA 140 07/12/2021 0951   K 4.1 07/05/2022 1129   CL 108 07/05/2022 1129   CO2 28 07/05/2022 1129   BUN 14 07/05/2022 1129   BUN 14 07/12/2021 0951   CREATININE 0.81 07/05/2022 1129   CREATININE 0.79 03/10/2014 0836      Component Value Date/Time   CALCIUM 11.1 (H) 07/05/2022 1129   ALKPHOS 108 07/05/2022 1129   AST 21 07/05/2022 1129   ALT 20 07/05/2022 1129   BILITOT 0.8 07/05/2022 1129       Impression and Plan: Heather Brown is a 76 year old white female.  She has polycythemia vera.  So far, she is done very nicely.  Her iron levels are low so we really have not had to phlebotomize her.  She is taking baby aspirin which I think is a very good idea.  I think we can probably get her back in 6 months.  I think this is reasonable.    [name], MD 12/7/202312:50 PM

## 2022-07-10 ENCOUNTER — Ambulatory Visit (INDEPENDENT_AMBULATORY_CARE_PROVIDER_SITE_OTHER): Payer: Medicare Other | Admitting: Nurse Practitioner

## 2022-07-10 ENCOUNTER — Encounter: Payer: Self-pay | Admitting: Nurse Practitioner

## 2022-07-10 VITALS — BP 128/84 | HR 64 | Temp 98.2°F | Wt 159.8 lb

## 2022-07-10 DIAGNOSIS — R319 Hematuria, unspecified: Secondary | ICD-10-CM | POA: Diagnosis not present

## 2022-07-10 DIAGNOSIS — R3129 Other microscopic hematuria: Secondary | ICD-10-CM

## 2022-07-10 DIAGNOSIS — N3001 Acute cystitis with hematuria: Secondary | ICD-10-CM

## 2022-07-10 LAB — POCT URINALYSIS DIP (CLINITEK)
Bilirubin, UA: NEGATIVE
Glucose, UA: NEGATIVE mg/dL
Ketones, POC UA: NEGATIVE mg/dL
Nitrite, UA: NEGATIVE
POC PROTEIN,UA: 30 — AB
Spec Grav, UA: 1.015 (ref 1.010–1.025)
Urobilinogen, UA: 0.2 E.U./dL
pH, UA: 7 (ref 5.0–8.0)

## 2022-07-10 MED ORDER — CIPROFLOXACIN HCL 250 MG PO TABS: 250.0000 mg | ORAL_TABLET | Freq: Two times a day (BID) | ORAL | 0 refills | Status: AC

## 2022-07-10 NOTE — Progress Notes (Signed)
  Orma Render, DNP, AGNP-c [facility] [address] [place], [place] [address] [phone]  Subjective:   Heather Brown is a 76 y.o. female presents to day for evaluation of: Hematuria Adlyn reports that this morning her urine had a pink coloration to it and she was concerned for the presence of blood. She tells me that she has had some suprapubic pressure, but no dysuria. She has also had back pain on and off in her low back. She did recently get over COVID (about 3 weeks ago) and has had some loose stools over the last several days.   PMH, Medications, and Allergies reviewed and updated in chart as appropriate.   ROS negative except for what is listed in HPI. Objective:  BP 128/84   Pulse 64   Temp 98.2 F (36.8 C)   Wt 159 lb 12.8 oz (72.5 kg)   LMP 07/30/1980   BMI 29.23 kg/m  Physical Exam Vitals and nursing note reviewed.  Constitutional:      Appearance: Normal appearance.  HENT:     Head: Normocephalic.  Eyes:     Pupils: Pupils are equal, round, and reactive to light.  Cardiovascular:     Rate and Rhythm: Normal rate and regular rhythm.     Pulses: Normal pulses.     Heart sounds: Normal heart sounds.  Pulmonary:     Effort: Pulmonary effort is normal.     Breath sounds: Normal breath sounds.  Abdominal:     Palpations: Abdomen is soft.     Tenderness: There is abdominal tenderness. There is no right CVA tenderness or left CVA tenderness.  Musculoskeletal:        General: Normal range of motion.  Lymphadenopathy:     Cervical: No cervical adenopathy.  Skin:    General: Skin is warm and dry.     Capillary Refill: Capillary refill takes less than 2 seconds.  Neurological:     General: No focal deficit present.     Mental Status: She is alert and oriented to person, place, and time.  Psychiatric:        Mood and Affect: Mood normal.           Assessment & Plan:   Problem List Items Addressed This Visit     Acute  cystitis with hematuria - Primary    Symptoms and presentation consistent with cystitis. Will send urine for culture to ensure appropriate antibiotic selection. Patient instructed to increase water intake and monitor for new or worsening symptoms. Given her history of polycythemia, we will monitor closely. No alarm sx present at this time.       Relevant Medications   ciprofloxacin (CIPRO) 250 MG tablet   Other Relevant Orders   Urine Culture   Other Visit Diagnoses     Hematuria, unspecified type       Relevant Orders   POCT URINALYSIS DIP (CLINITEK) (Completed)         Orma Render, DNP, AGNP-c 07/11/2022  3:48 PM    History, Medications, Surgery, SDOH, and Family History reviewed and updated as appropriate.

## 2022-07-10 NOTE — Patient Instructions (Signed)
If you are still having blood at the end of the week or next week let me know.

## 2022-07-11 DIAGNOSIS — N3001 Acute cystitis with hematuria: Secondary | ICD-10-CM | POA: Insufficient documentation

## 2022-07-11 HISTORY — DX: Acute cystitis with hematuria: N30.01

## 2022-07-11 LAB — URINE CULTURE

## 2022-07-11 NOTE — Assessment & Plan Note (Signed)
Symptoms and presentation consistent with cystitis. Will send urine for culture to ensure appropriate antibiotic selection. Patient instructed to increase water intake and monitor for new or worsening symptoms. Given her history of polycythemia, we will monitor closely. No alarm sx present at this time.

## 2022-07-16 ENCOUNTER — Telehealth: Payer: Self-pay | Admitting: Nurse Practitioner

## 2022-07-16 DIAGNOSIS — B3731 Acute candidiasis of vulva and vagina: Secondary | ICD-10-CM

## 2022-07-16 MED ORDER — FLUCONAZOLE 150 MG PO TABS: ORAL_TABLET | ORAL | 2 refills | Status: DC

## 2022-07-16 NOTE — Telephone Encounter (Signed)
Pt called and stated the medication she was taking for her infection has given her a yeast infection and is asking for medication.  Also she sees her lab results online and was wondering if you or the nurse can go over them with her as soon as you can.

## 2022-07-16 NOTE — Telephone Encounter (Signed)
Medication sent. Will send labs via [facility].

## 2022-07-25 DIAGNOSIS — K5730 Diverticulosis of large intestine without perforation or abscess without bleeding: Secondary | ICD-10-CM | POA: Diagnosis not present

## 2022-07-25 DIAGNOSIS — K828 Other specified diseases of gallbladder: Secondary | ICD-10-CM | POA: Diagnosis not present

## 2022-07-25 DIAGNOSIS — N132 Hydronephrosis with renal and ureteral calculous obstruction: Secondary | ICD-10-CM | POA: Diagnosis not present

## 2022-07-25 DIAGNOSIS — N200 Calculus of kidney: Secondary | ICD-10-CM | POA: Diagnosis not present

## 2022-07-25 DIAGNOSIS — R9431 Abnormal electrocardiogram [ECG] [EKG]: Secondary | ICD-10-CM | POA: Diagnosis not present

## 2022-07-25 DIAGNOSIS — K862 Cyst of pancreas: Secondary | ICD-10-CM | POA: Diagnosis not present

## 2022-07-26 ENCOUNTER — Telehealth: Payer: Self-pay | Admitting: *Deleted

## 2022-07-26 ENCOUNTER — Encounter: Payer: Self-pay | Admitting: Nurse Practitioner

## 2022-07-26 ENCOUNTER — Telehealth (INDEPENDENT_AMBULATORY_CARE_PROVIDER_SITE_OTHER): Payer: Medicare Other | Admitting: Nurse Practitioner

## 2022-07-26 DIAGNOSIS — K862 Cyst of pancreas: Secondary | ICD-10-CM | POA: Diagnosis not present

## 2022-07-26 DIAGNOSIS — R1900 Intra-abdominal and pelvic swelling, mass and lump, unspecified site: Secondary | ICD-10-CM

## 2022-07-26 NOTE — Telephone Encounter (Signed)
Call received from patient to inform Dr. Marin Olp that she had a CT scan done of the abdomen which showed a 2.6 cm lesion on the head of her pancreas.  Dr. Marin Olp notified.  No new orders received at this time.

## 2022-07-26 NOTE — Progress Notes (Signed)
Virtual Visit Encounter mychart visit.   I connected with  Heather Brown on 07/30/22 at  9:45 AM EST by secure video and audio telemedicine application. I verified that I am speaking with the correct person using two identifiers.   I introduced myself as a Designer, jewellery with the practice. The limitations of evaluation and management by telemedicine discussed with the patient and the availability of in person appointments. The patient expressed verbal understanding and consent to proceed.  Participating parties in this visit include: Myself and patient  The patient is: Patient Location: Home I am: Provider Location: Office/Clinic Subjective:    CC and HPI: Heather Brown is a 76 y.o. year old female presenting for follow up of recent ED visit. Patient reports the following: Pancreatic Lesion Heather Brown was recently seen in the ED for abdominal pain and a CT was performed. An incidental finding on the CT was a pancreatic lesion that they recommend further work-up for evaluation. She tells me she is concerned with this finding and would like to know what next steps need to be taken.   She tells me she has noticed a change in bowel habits over the last few months with intermittent diarrhea. She has lost 13 lbs but has been cutting out her sweets. She has not had any upper abdominal pain, nausea, or vomiting.   Past medical history, Surgical history, Family history not pertinant except as noted below, Social history, Allergies, and medications have been entered into the medical record, reviewed, and corrections made.   Review of Systems:  All review of systems negative except what is listed in the HPI  Objective:    Alert and oriented x 4 Speaking in clear sentences with no shortness of breath. No distress.  Impression and Recommendations:    Problem List Items Addressed This Visit     Pancreatic cyst - Primary    Evaluation of the imaging from the ED today. We will  order MRI for further evaluation and send referral to endocrinology for recommendations. We will plan to follow-up once we have the MRI results back and make changes to plan of care as necessary. Reassurance provided today.       Relevant Orders   Ambulatory referral to Endocrinology   MR Abdomen W Wo Contrast   Other Visit Diagnoses     Intra-abdominal and pelvic swelling, mass and lump, unspecified site       Relevant Orders   MR Abdomen W Wo Contrast       orders and follow up as documented in EMR I discussed the assessment and treatment plan with the patient. The patient was provided an opportunity to ask questions and all were answered. The patient agreed with the plan and demonstrated an understanding of the instructions.   The patient was advised to call back or seek an in-person evaluation if the symptoms worsen or if the condition fails to improve as anticipated.  Follow-Up: after labs and/or imaging, consults, etc. have been completed  I provided 27 minutes of non-face-to-face interaction with this non face-to-face encounter including intake, same-day documentation, and chart review.   Orma Render, NP , DNP, AGNP-c [place] at [facility] [phone] (fax)

## 2022-07-27 ENCOUNTER — Telehealth: Payer: Medicare Other | Admitting: Nurse Practitioner

## 2022-07-30 DIAGNOSIS — K862 Cyst of pancreas: Secondary | ICD-10-CM | POA: Insufficient documentation

## 2022-07-30 NOTE — Assessment & Plan Note (Signed)
Evaluation of the imaging from the ED today. We will order MRI for further evaluation and send referral to endocrinology for recommendations. We will plan to follow-up once we have the MRI results back and make changes to plan of care as necessary. Reassurance provided today.

## 2022-07-31 ENCOUNTER — Encounter: Payer: Self-pay | Admitting: Hematology & Oncology

## 2022-07-31 ENCOUNTER — Telehealth: Payer: Self-pay

## 2022-07-31 NOTE — Telephone Encounter (Signed)
Pt. Called to ask if her MRI was approved by her ins. Because she is trying to get it scheduled sooner. I checked with our referral coordinator and it has been approved. I told her she can call the imaging place to see if they have any cancellations daily and they can squeeze her in. She then asked about a GI referral I did not see one in the system. I can put in a GI referral for her if you would like.

## 2022-08-01 ENCOUNTER — Other Ambulatory Visit: Payer: Self-pay

## 2022-08-01 DIAGNOSIS — K862 Cyst of pancreas: Secondary | ICD-10-CM

## 2022-08-01 DIAGNOSIS — R1900 Intra-abdominal and pelvic swelling, mass and lump, unspecified site: Secondary | ICD-10-CM

## 2022-08-02 ENCOUNTER — Ambulatory Visit (HOSPITAL_COMMUNITY)
Admission: RE | Admit: 2022-08-02 | Discharge: 2022-08-02 | Disposition: A | Discharge status: Home / Self Care | Payer: Medicare Other | Source: Ambulatory Visit | Attending: Nurse Practitioner | Admitting: Nurse Practitioner

## 2022-08-02 DIAGNOSIS — K862 Cyst of pancreas: Secondary | ICD-10-CM | POA: Diagnosis not present

## 2022-08-02 DIAGNOSIS — R1900 Intra-abdominal and pelvic swelling, mass and lump, unspecified site: Secondary | ICD-10-CM | POA: Diagnosis not present

## 2022-08-02 MED ORDER — GADOBUTROL 1 MMOL/ML IV SOLN
7.5000 mL | Freq: Once | INTRAVENOUS | Status: AC | PRN
  Administered 2022-08-02: 7.5 mL via INTRAVENOUS

## 2022-08-06 ENCOUNTER — Telehealth: Payer: Self-pay | Admitting: Nurse Practitioner

## 2022-08-06 NOTE — Telephone Encounter (Signed)
Pt called and is wondering if her mri results have gotten back.

## 2022-08-10 ENCOUNTER — Ambulatory Visit (HOSPITAL_COMMUNITY): Payer: Medicare Other

## 2022-08-13 ENCOUNTER — Encounter: Payer: Self-pay | Admitting: Gastroenterology

## 2022-08-16 ENCOUNTER — Encounter: Payer: Self-pay | Admitting: Hematology & Oncology

## 2022-08-16 ENCOUNTER — Ambulatory Visit: Payer: BLUE CROSS/BLUE SHIELD | Admitting: Gastroenterology

## 2022-08-16 NOTE — Progress Notes (Deleted)
HPI :     CT abdomen/pelvis Jan 03, 2022 IMPRESSION: 1. 2 mm calculus at the left UVJ. No hydronephrosis. 2. 2.7 cm cystic lesion in the uncinate process of the pancreas with low risk imaging features. Recommend follow-up pancreatic protocol CT or MRI with and without contrast in 6 months. 3. Aortic Atherosclerosis (ICD10-I70.0)  MRCP Aug 02, 2022 IMPRESSION: As seen on CT there is a 2.8 cm simple appearing cystic lesion along the head of the pancreas. Overall this is low suspicion. There are some smaller foci along the body and tail of the pancreas as well measuring up to 7 mm. Based on the overall appearance of the patient's age recommend follow-up imaging in 6 months to confirm stability.   Few mildly enlarged central mesenteric lymph nodes. Retrospect these are stable going back to the prior CT scan. With this interval stability would recommend continued attention on follow-up in 6 months unless there is clinical suspicion for more urgent workup.   Past Medical History:  Diagnosis Date   ACE-inhibitor cough    Dyspareunia    dryness with intercourse   Endometriosis 1982   found at [facility]   GERD (gastroesophageal reflux disease)    Heart murmur    History of left shoulder fracture 2017   Hypothyroidism    Left ACL tear    Osteopenia 2009   Pneumonia    Polycythemia vera ([place]) 12/02/2017   PONV (postoperative nausea and vomiting)    Thyroid nodule    followed by Dr Chalmers Cater   Urinary incontinence    with coughing/sneezing     Past Surgical History:  Procedure Laterality Date   ABDOMINAL HYSTERECTOMY  07/30/1980   [facility]--for abnormal paps/endometriosis   AUGMENTATION MAMMAPLASTY  07/31/1975   BREAST IMPLANT EXCHANGE  10/2020   COLONOSCOPY WITH PROPOFOL N/A 10/29/2013   Procedure: COLONOSCOPY WITH PROPOFOL;  Surgeon: Jeryl Columbia, MD;  Location: [facility] ENDOSCOPY;  Service: Endoscopy;  Laterality: N/A;   COLONOSCOPY WITH PROPOFOL N/A 12/22/2014   Procedure: COLONOSCOPY  WITH PROPOFOL;  Surgeon: Clarene Essex, MD;  Location: [facility] ENDOSCOPY;  Service: Endoscopy;  Laterality: N/A;  ultra slim scope   COSMETIC SURGERY  11/28/2011   face lift,neck lift, eye lift--Dr. Stephanie Coup   dilatation and curettage  07/30/1980   HOT HEMOSTASIS N/A 12/22/2014   Procedure: HOT HEMOSTASIS (ARGON PLASMA COAGULATION/BICAP);  Surgeon: Clarene Essex, MD;  Location: [facility] ENDOSCOPY;  Service: Endoscopy;  Laterality: N/A;   TONSILLECTOMY     Family History  Problem Relation Age of Onset   Cancer Mother    Cervical cancer Mother    Diabetes Father        adult onset   Stroke Father    Thyroid disease Sister        hyperthyroid   Asthma Sister    Thyroid disease Sister        hypothyroid   Cancer Maternal Grandmother    Stomach cancer Maternal Grandmother    Social History   Tobacco Use   Smoking status: Never   Smokeless tobacco: Never  Vaping Use   Vaping Use: Never used  Substance Use Topics   Alcohol use: Not Currently    Alcohol/week: 1.0 standard drink of alcohol    Types: 1 Glasses of wine per week    Comment: Once every two months   Drug use: No   Current Outpatient Medications  Medication Sig Dispense Refill   fluconazole (DIFLUCAN) 150 MG tablet Take one tablet by mouth at the first  sign of symptoms of yeast. If no resolution, repeat dose in 72 hours. (Patient not taking: Reported on 07/26/2022) 2 tablet 2   aspirin 81 MG tablet Take 81 mg by mouth daily.     azelastine (ASTELIN) 0.1 % nasal spray Place 2 sprays into both nostrils 2 (two) times daily. Use in each nostril as directed 30 mL 6   Biotin 2500 MCG CAPS Take 1 tablet by mouth daily.     Cholecalciferol (VITAMIN D3) 1000 UNITS CAPS Take 1,000 Units by mouth daily.     levothyroxine (SYNTHROID) 75 MCG tablet 75 mcg daily.     loratadine (CLARITIN) 10 MG tablet Take by mouth at bedtime.     Multiple Vitamin (MULTIVITAMIN) capsule Take 1 capsule by mouth daily.     Nutritional Supplements (JOINT FORMULA PO)  Take 1 capsule by mouth 1 day or 1 dose.     tretinoin (RETIN-A) 0.05 % cream Apply pea sized amount to affected area of face nightly as needed.  5   No current facility-administered medications for this visit.   Allergies  Allergen Reactions   Sulfa Antibiotics Hives   Cephalexin Rash    Face turned bright red.   Diazepam Other (See Comments)     Review of Systems: All systems reviewed and negative except where noted in HPI.    MR Abdomen W Wo Contrast  Result Date: 08/03/2022 CLINICAL DATA:  Mass.  Pancreatic cysts. EXAM: MRI ABDOMEN WITHOUT AND WITH CONTRAST with MRCP TECHNIQUE: Multiplanar multisequence MR imaging of the abdomen was performed both before and after the administration of intravenous contrast. Dedicated MRCP images were obtained thin section and multiplanar reformatted images generated on a separate workstation to better define the biliary anatomy. CONTRAST:  7.27m GADAVIST GADOBUTROL 1 MMOL/ML IV SOLN COMPARISON:  CT 01/03/2022. ultrasound 09/25/2016. FINDINGS: Lower chest: Breast implants are seen at the edge of the imaging field. No pleural effusion at the lung bases. There is breathing motion throughout the examination. Hepatobiliary: No enhancing liver lesion. There is one bright T2, low T1 focus identified in the left hepatic lobe lateral segment, segment 3 which is homogeneous. Diameter of 11 mm such as series 7 image 13. No abnormal enhancement on the dynamic portion of the examination consistent with a benign cyst or biliary hamartoma. No additional specific imaging follow up. Patent portal vein. On the MRCP datasets the common duct has a diameter of 6 mm proximally. No intrahepatic biliary ductal dilatation. The gallbladder is nondilated without filling defect or wall thickening. Pancreas: Global mild pancreatic atrophy. No abnormal pancreatic T1 signal. As seen on the CT scan there is a cystic lesion along the uncinate process of the pancreas anteriorly. On the prior  CT this measured 2.7 x 1.6 cm. Today this is measured at 2.8 x 1.9 cm, similar when adjusting for differences in technique. Cephalocaudal dimension 19 mm. This is homogeneous. No enhancing features, septa or nodules. No restricted diffusion. This does not communicate with the pancreatic duct. The pancreatic duct is nondilated. In addition there are some smaller cystic foci along the pancreatic parenchyma. Example towards the body/tail on series 14, image 35 measures 7 mm and along the midbody extending inferior measuring 7 mm on image 48 of series 14. This third lesion may be a branch duct ectasia. Again no focal parenchymal atrophy of the pancreas. Spleen: Normal-sized spleen.  No abnormal enhancement. Adrenals/Urinary Tract: The adrenal glands unremarkable. Tiny simple left-sided renal cyst posterolateral. No abnormal enhancement. Bright on T2 with diameter  of 5 mm. Stomach/Bowel: The visualized bowel is nondilated. This includes the visualized portions of the large and small bowel. The stomach is nondistended. Vascular/Lymphatic: Normal caliber aorta and IVC. Mild plaque along the aorta. No retroperitoneal lymph node enlargement identified in the visualized abdomen. There are several prominent mesenteric nodes identified. Example left of midline image 67 of series 21 measuring 2.7 x 1.2 cm. These are of uncertain etiology and significance. Other:  None Musculoskeletal: Scattered degenerative changes along the spine. Multilevel Schmorl's node changes. IMPRESSION: As seen on CT there is a 2.8 cm simple appearing cystic lesion along the head of the pancreas. Overall this is low suspicion. There are some smaller foci along the body and tail of the pancreas as well measuring up to 7 mm. Based on the overall appearance of the patient's age recommend follow-up imaging in 6 months to confirm stability. Few mildly enlarged central mesenteric lymph nodes. Retrospect these are stable going back to the prior CT scan. With  this interval stability would recommend continued attention on follow-up in 6 months unless there is clinical suspicion for more urgent workup. Electronically Signed   By: Jill Side M.D.   On: 08/03/2022 16:46    Physical Exam: LMP 07/30/1980  Constitutional: Pleasant,well-developed, ***female in no acute distress. HEENT: Normocephalic and atraumatic. Conjunctivae are normal. No scleral icterus. Neck supple.  Cardiovascular: Normal rate, regular rhythm.  Pulmonary/chest: Effort normal and breath sounds normal. No wheezing, rales or rhonchi. Abdominal: Soft, nondistended, nontender. Bowel sounds active throughout. There are no masses palpable. No hepatomegaly. Extremities: no edema Lymphadenopathy: No cervical adenopathy noted. Neurological: Alert and oriented to person place and time. Skin: Skin is warm and dry. No rashes noted. Psychiatric: Normal mood and affect. Behavior is normal.  CBC    Component Value Date/Time   WBC 3.9 (L) 07/05/2022 1129   WBC 6.3 01/03/2022 1731   RBC 5.43 (H) 07/05/2022 1129   HGB 13.1 07/05/2022 1129   HGB 13.2 07/12/2021 0951   HGB 14.9 04/07/2015 0950   HCT 43.1 07/05/2022 1129   HCT 43.6 07/12/2021 0951   PLT 304 07/05/2022 1129   PLT 329 07/12/2021 0951   MCV 79.4 (L) 07/05/2022 1129   MCV 77 (L) 07/12/2021 0951   MCH 24.1 (L) 07/05/2022 1129   MCHC 30.4 07/05/2022 1129   RDW 15.8 (H) 07/05/2022 1129   RDW 14.9 07/12/2021 0951   LYMPHSABS 1.3 07/05/2022 1129   LYMPHSABS 1.2 07/12/2021 0951   MONOABS 0.5 07/05/2022 1129   EOSABS 0.1 07/05/2022 1129   EOSABS 0.1 07/12/2021 0951   BASOSABS 0.0 07/05/2022 1129   BASOSABS 0.1 07/12/2021 0951    CMP     Component Value Date/Time   NA 143 07/05/2022 1129   NA 140 07/12/2021 0951   K 4.1 07/05/2022 1129   CL 108 07/05/2022 1129   CO2 28 07/05/2022 1129   GLUCOSE 66 (L) 07/05/2022 1129   BUN 14 07/05/2022 1129   BUN 14 07/12/2021 0951   CREATININE 0.81 07/05/2022 1129   CREATININE  0.79 03/10/2014 0836   CALCIUM 11.1 (H) 07/05/2022 1129   PROT 7.1 07/05/2022 1129   PROT 6.8 07/12/2021 0951   ALBUMIN 4.3 07/05/2022 1129   ALBUMIN 4.2 07/12/2021 0951   AST 21 07/05/2022 1129   ALT 20 07/05/2022 1129   ALKPHOS 108 07/05/2022 1129   BILITOT 0.8 07/05/2022 1129   GFRNONAA >60 07/05/2022 1129   GFRAA >60 03/23/2020 1154    Ref Range & Units 3  wk ago   Sodium 134 - 143 mmol/L 139  Potassium 3.5 - 5.1 mmol/L 4.0  Chloride 98 - 107 mmol/L 107  CO2 21 - 31 mmol/L 26  Anion Gap 3 - 15 mmol/L 6  Glucose, Random 70 - 125 mg/dL 100  Blood Urea Nitrogen (BUN) 8 - 30 mg/dL 12  Creatinine 0.51 - 0.95 mg/dL 0.71  eGFR >59 mL/min/1.23m 88  Comment: GFR estimated by CKD-EPI equations (NKF 2021). Recommend confirmation of eGFR using Cystatin C based eGFR in complex cases for clinical decision-making.  Albumin 3.7 - 4.8 g/dL 4.1  Total Protein 6.3 - 8.3 g/dL 7.0  Bilirubin, Total 0.3 - 1.0 mg/dL 0.7  Alkaline Phosphatase (ALP) 38 - 120 U/L 116  Aspartate Aminotransferase (AST) 13 - 39 U/L 20  Alanine Aminotransferase (ALT) 7 - 52 U/L 19  Calcium 8.6 - 10.3 mg/dL 10.6 High    Component Ref Range & Units 1 mo ago (07/05/22) 8 mo ago (11/22/21) 11 mo ago (08/23/21) 1 yr ago (02/22/21) 1 yr ago (08/24/20) 2 yr ago (05/24/20) 2 yr ago (03/23/20)  Iron 28 - 170 ug/dL 42 37 58 46 R 48 R 46 R 48 R  TIBC 250 - 450 ug/dL 465 High  524 High  522 High  431 R 445 High  R 434 R 450 High  R  Saturation Ratios 10.4 - 31.8 % 9 Low  7 Low  11 11 Low  R 11 Low  R 11 Low  R 11 Low  R  UIBC 148 - 442 ug/dL 423 487 High  CM 464 High  CM 385 High  R, CM 397 High  R, CM 387 High  R, CM 402 High  R, CM  Comment: [facility], [address] [id number]            Component Ref Range & Units 1 mo ago (07/05/22) 8 mo ago (11/22/21) 11 mo ago (08/23/21) 1 yr ago (02/22/21) 1 yr ago (08/24/20) 2 yr ago (05/24/20) 2 yr ago (03/23/20)  Ferritin 11  - 307 ng/mL 7 Low  5 Low  CM 8 Low  CM 5 Low  CM 8 Low  CM 10 Low  CM 8 Low  C    ASSESSMENT AND PLAN:  Early, SCoralee Pesa NP

## 2022-08-20 ENCOUNTER — Encounter: Payer: Self-pay | Admitting: Nurse Practitioner

## 2022-08-20 ENCOUNTER — Other Ambulatory Visit: Payer: Self-pay | Admitting: Endocrinology

## 2022-08-20 DIAGNOSIS — E041 Nontoxic single thyroid nodule: Secondary | ICD-10-CM

## 2022-08-30 ENCOUNTER — Encounter: Payer: Self-pay | Admitting: Hematology & Oncology

## 2022-08-30 DIAGNOSIS — K080 Exfoliation of teeth due to systemic causes: Secondary | ICD-10-CM | POA: Diagnosis not present

## 2022-09-18 ENCOUNTER — Other Ambulatory Visit (INDEPENDENT_AMBULATORY_CARE_PROVIDER_SITE_OTHER): Payer: Medicare Other

## 2022-09-18 ENCOUNTER — Ambulatory Visit (INDEPENDENT_AMBULATORY_CARE_PROVIDER_SITE_OTHER): Payer: Medicare Other | Admitting: Gastroenterology

## 2022-09-18 ENCOUNTER — Encounter: Payer: Self-pay | Admitting: Gastroenterology

## 2022-09-18 VITALS — BP 122/64 | HR 89 | Ht 62.0 in | Wt 161.0 lb

## 2022-09-18 DIAGNOSIS — K862 Cyst of pancreas: Secondary | ICD-10-CM

## 2022-09-18 DIAGNOSIS — K219 Gastro-esophageal reflux disease without esophagitis: Secondary | ICD-10-CM

## 2022-09-18 LAB — LIPASE: Lipase: 28 U/L (ref 11.0–59.0)

## 2022-09-18 MED ORDER — FAMOTIDINE 20 MG PO TABS: 20.0000 mg | ORAL_TABLET | Freq: Two times a day (BID) | ORAL | 3 refills | Status: DC | PRN

## 2022-09-18 NOTE — Progress Notes (Signed)
HPI : Heather Brown is a very pleasant 77 year old female with a history of PCV and hypothyroidism who is referred to [place] by Jacolyn Reedy, NP for further evaluation of incidental pancreatic cyst.  The patient was noted to have a pancreatic cyst on CT in June 2023 when she presented to the ED with symptomatic nephrolithiasis, but it does not appear that any further evaluation or surveillance was recommended or discussed at that time.  The patient states she did not know she had a cyst back then.  At that time, the cyst measures 1.6 x 2.7 cm in the uncinate process.  No ductal dilation or concerning features  The cyst was again noted when she returned to the ED in [place], [place] in Dec 2023, again with symptomatic nephrolithisis.  The cyst was 2.6 cm in the head of the pancreas.  Her PCP ordered an MRI which was done in Aug 03, 2022 and showed the cyst to be 2.8 x 1.9 cm in size.  The cyst was homogenous with no concerning features such as ductal dilation, nodularity, solid component.  Some smaller cystic foci were noted in the body/tail. The cyst did not seem to communicate with the pancreatic duct.  She denies any history of pancreatitis.  No family history of pancreatic cancer.  She is a lifelong nonsmoker.  She denies any chronic abdominal pain, nausea, vomiting or unintentional weight loss. She does have bothersome GERD symptoms when she eats certain foods (namely [place] and [place]) and take TUMs as needed, but was advised to not take this because of her recurrent kidney stones.  She did not require intervention for the kidney stone; she says it passed spontaneously.  She has had persistently elevated serum calcium levels (10-11) going back several years.  Levels have been stable.  I do not see that she has had a parathyroid level.    MRI abdomen Aug 03, 2022 IMPRESSION: As seen on CT there is a 2.8 cm simple appearing cystic lesion along the head of the pancreas. Overall this is low  suspicion. There are some smaller foci along the body and tail of the pancreas as well measuring up to 7 mm. Based on the overall appearance of the patient's age recommend follow-up imaging in 6 months to confirm stability.   Few mildly enlarged central mesenteric lymph nodes. Retrospect these are stable going back to the prior CT scan. With this interval stability would recommend continued attention on follow-up in 6 months unless there is clinical suspicion for more urgent workup   CT abd/pelvis Jul 25, 2022 IMPRESSION: 1. There is a punctate density near the left UVJ which may reflect a tiny calculus, however there is no upstream left ureteral dilatation or left hydronephrosis. 2. There is a 2.6 cm cystic lesion within the pancreatic head which could be an intraductal papillary mucinous cystic neoplasm. Outpatient pancreatic protocol MRI suggested    CT abd/pelvis Jan 03, 2022 IMPRESSION: 1. 2 mm calculus at the left UVJ. No hydronephrosis. 2. 2.7 cm cystic lesion in the uncinate process of the pancreas with low risk imaging features. Recommend follow-up pancreatic protocol CT or MRI with and without contrast in 6 months. 3. Aortic Atherosclerosis (ICD10-I70.0)    Colonoscopy Sep 30, 2017 Descending colon polyp:  lymphoid aggregate  Colonoscopy Oct 29, 2013 Sigmoid TVA  Past Medical History:  Diagnosis Date   ACE-inhibitor cough    Dyspareunia    dryness with intercourse   Endometriosis 1982   found  at [facility]   GERD (gastroesophageal reflux disease)    Heart murmur    History of left shoulder fracture 2017   Hypothyroidism    Left ACL tear    Osteopenia 2009   Pneumonia    Polycythemia vera ([place]) 12/02/2017   PONV (postoperative nausea and vomiting)    Thyroid nodule    followed by Dr Chalmers Cater   Urinary incontinence    with coughing/sneezing     Past Surgical History:  Procedure Laterality Date   ABDOMINAL HYSTERECTOMY  07/30/1980   [facility]--for abnormal  paps/endometriosis   AUGMENTATION MAMMAPLASTY  07/31/1975   BREAST IMPLANT EXCHANGE  10/2020   COLONOSCOPY WITH PROPOFOL N/A 10/29/2013   Procedure: COLONOSCOPY WITH PROPOFOL;  Surgeon: Jeryl Columbia, MD;  Location: [facility] ENDOSCOPY;  Service: Endoscopy;  Laterality: N/A;   COLONOSCOPY WITH PROPOFOL N/A 12/22/2014   Procedure: COLONOSCOPY WITH PROPOFOL;  Surgeon: Clarene Essex, MD;  Location: [facility] ENDOSCOPY;  Service: Endoscopy;  Laterality: N/A;  ultra slim scope   COSMETIC SURGERY  11/28/2011   face lift,neck lift, eye lift--Dr. Stephanie Coup   dilatation and curettage  07/30/1980   HOT HEMOSTASIS N/A 12/22/2014   Procedure: HOT HEMOSTASIS (ARGON PLASMA COAGULATION/BICAP);  Surgeon: Clarene Essex, MD;  Location: [facility] ENDOSCOPY;  Service: Endoscopy;  Laterality: N/A;   TONSILLECTOMY     Family History  Problem Relation Age of Onset   Cancer Mother    Cervical cancer Mother    Diabetes Father        adult onset   Stroke Father    Thyroid disease Sister        hyperthyroid   Asthma Sister    Thyroid disease Sister        hypothyroid   Cancer Maternal Grandmother    Stomach cancer Maternal Grandmother    Social History   Tobacco Use   Smoking status: Never   Smokeless tobacco: Never  Vaping Use   Vaping Use: Never used  Substance Use Topics   Alcohol use: Not Currently    Alcohol/week: 1.0 standard drink of alcohol    Types: 1 Glasses of wine per week    Comment: Once every two months   Drug use: No   Current Outpatient Medications  Medication Sig Dispense Refill   fluconazole (DIFLUCAN) 150 MG tablet Take one tablet by mouth at the first sign of symptoms of yeast. If no resolution, repeat dose in 72 hours. (Patient not taking: Reported on 07/26/2022) 2 tablet 2   aspirin 81 MG tablet Take 81 mg by mouth daily.     azelastine (ASTELIN) 0.1 % nasal spray Place 2 sprays into both nostrils 2 (two) times daily. Use in each nostril as directed 30 mL 6   Biotin 2500 MCG CAPS Take 1 tablet by mouth  daily.     Cholecalciferol (VITAMIN D3) 1000 UNITS CAPS Take 1,000 Units by mouth daily.     levothyroxine (SYNTHROID) 75 MCG tablet 75 mcg daily.     loratadine (CLARITIN) 10 MG tablet Take by mouth at bedtime.     Multiple Vitamin (MULTIVITAMIN) capsule Take 1 capsule by mouth daily.     Nutritional Supplements (JOINT FORMULA PO) Take 1 capsule by mouth 1 day or 1 dose.     tretinoin (RETIN-A) 0.05 % cream Apply pea sized amount to affected area of face nightly as needed.  5   No current facility-administered medications for this visit.   Allergies  Allergen Reactions   Sulfa Antibiotics Hives   Cephalexin  Rash    Face turned bright red.   Diazepam Other (See Comments)     Review of Systems: All systems reviewed and negative except where noted in HPI.    No results found.  Physical Exam: Ht 5' 2"$  (1.575 m)   Wt 161 lb (73 kg)   LMP 07/30/1980   BMI 29.45 kg/m  Constitutional: Pleasant,well-developed, Caucasian female in no acute distress. HEENT: Normocephalic and atraumatic. Conjunctivae are normal. No scleral icterus. Neck supple.  Cardiovascular: Normal rate, regular rhythm.  Pulmonary/chest: Effort normal and breath sounds normal. No wheezing, rales or rhonchi. Abdominal: Soft, nondistended, nontender. Bowel sounds active throughout. There are no masses palpable. No hepatomegaly. Extremities: no edema Lymphadenopathy: No cervical adenopathy noted. Neurological: Alert and oriented to person place and time. Skin: Skin is warm and dry. No rashes noted. Psychiatric: Normal mood and affect. Behavior is normal.  CBC    Component Value Date/Time   WBC 3.9 (L) 07/05/2022 1129   WBC 6.3 01/03/2022 1731   RBC 5.43 (H) 07/05/2022 1129   HGB 13.1 07/05/2022 1129   HGB 13.2 07/12/2021 0951   HGB 14.9 04/07/2015 0950   HCT 43.1 07/05/2022 1129   HCT 43.6 07/12/2021 0951   PLT 304 07/05/2022 1129   PLT 329 07/12/2021 0951   MCV 79.4 (L) 07/05/2022 1129   MCV 77 (L)  07/12/2021 0951   MCH 24.1 (L) 07/05/2022 1129   MCHC 30.4 07/05/2022 1129   RDW 15.8 (H) 07/05/2022 1129   RDW 14.9 07/12/2021 0951   LYMPHSABS 1.3 07/05/2022 1129   LYMPHSABS 1.2 07/12/2021 0951   MONOABS 0.5 07/05/2022 1129   EOSABS 0.1 07/05/2022 1129   EOSABS 0.1 07/12/2021 0951   BASOSABS 0.0 07/05/2022 1129   BASOSABS 0.1 07/12/2021 0951    CMP     Component Value Date/Time   NA 143 07/05/2022 1129   NA 140 07/12/2021 0951   K 4.1 07/05/2022 1129   CL 108 07/05/2022 1129   CO2 28 07/05/2022 1129   GLUCOSE 66 (L) 07/05/2022 1129   BUN 14 07/05/2022 1129   BUN 14 07/12/2021 0951   CREATININE 0.81 07/05/2022 1129   CREATININE 0.79 03/10/2014 0836   CALCIUM 11.1 (H) 07/05/2022 1129   PROT 7.1 07/05/2022 1129   PROT 6.8 07/12/2021 0951   ALBUMIN 4.3 07/05/2022 1129   ALBUMIN 4.2 07/12/2021 0951   AST 21 07/05/2022 1129   ALT 20 07/05/2022 1129   ALKPHOS 108 07/05/2022 1129   BILITOT 0.8 07/05/2022 1129   GFRNONAA >60 07/05/2022 1129   GFRAA >60 03/23/2020 1154     ASSESSMENT AND PLAN: 77 year old female with incidentally noted pancreatic cyst, originally seen in June 2023, essentially stable in size compared to Jan 2024.  No high risk radiographic features.  No risk factors for pancreatic and no concerning symptoms Overall, the risk of malignancy is very low, but given the size of the cyst, I recommended she be considered for EUS w/ FNA.   Will discuss with our endosonographer, Dr. Rush Landmark.  If he agrees, will schedule patient for EUS w/ FNA.  Otherwise, will plan for surveillance MRI in July 2024. Will get baseline 19-9 and lipase. Will get PTH level given persistent mild hypercalcemia. Recommended she take Pepcid as needed when she eats foods that cause GERD symptoms.  Pancreatic cyst - EUS w/ FNA - CA 19-9, lipase  Hypercalcemia - PTH  GERD - Pepcid  Bria Portales E. Candis Schatz, MD [facility]   Early, Coralee Pesa,  NP

## 2022-09-18 NOTE — Patient Instructions (Addendum)
You will be contacted to schedule EUS, after Dr.Mansouraty has reviewed your case.   Your provider has requested that you go to the basement level for lab work before leaving today. Press "B" on the elevator. The lab is located at the first door on the left as you exit the elevator.  We have sent the following medications to your pharmacy for you to pick up at your convenience: Pepcid   _______________________________________________________  If your blood pressure at your visit was 140/90 or greater, please contact your primary care physician to follow up on this.  _______________________________________________________  If you are age 46 or older, your body mass index should be between 23-30. Your Body mass index is 29.45 kg/m. If this is out of the aforementioned range listed, please consider follow up with your Primary Care Provider.  If you are age 40 or younger, your body mass index should be between 19-25. Your Body mass index is 29.45 kg/m. If this is out of the aformentioned range listed, please consider follow up with your Primary Care Provider.   ________________________________________________________  The [facility] providers would like to encourage you to use [facility] to communicate with providers for non-urgent requests or questions.  Due to long hold times on the telephone, sending your provider a message by [facility] may be a faster and more efficient way to get a response.  Please allow 48 business hours for a response.  Please remember that this is for non-urgent requests.  _______________________________________________________  Thank you for choosing me and [facility].  Dr.Scott Candis Schatz

## 2022-09-19 LAB — PTH, INTACT AND CALCIUM
Calcium: 10 mg/dL (ref 8.6–10.4)
PTH: 75 pg/mL (ref 16–77)

## 2022-09-19 LAB — CANCER ANTIGEN 19-9: CA 19-9: 21 U/mL (ref <34)

## 2022-09-22 NOTE — Progress Notes (Signed)
Ms. Roseman,  Your serum calcium level was normal as was your parathyroid level.  Tumor markers associated with pancreas cancer also normal.   Still awaiting to hear back from Dr. Rush Landmark regarding your EUS.

## 2022-09-28 ENCOUNTER — Encounter: Payer: Self-pay | Admitting: Gastroenterology

## 2022-10-01 ENCOUNTER — Encounter: Payer: Self-pay | Admitting: Hematology & Oncology

## 2022-10-02 ENCOUNTER — Encounter: Payer: Self-pay | Admitting: Nurse Practitioner

## 2022-10-02 ENCOUNTER — Ambulatory Visit (INDEPENDENT_AMBULATORY_CARE_PROVIDER_SITE_OTHER): Payer: Medicare Other | Admitting: Nurse Practitioner

## 2022-10-02 VITALS — BP 132/82 | HR 66 | Wt 162.8 lb

## 2022-10-02 DIAGNOSIS — Z87442 Personal history of urinary calculi: Secondary | ICD-10-CM

## 2022-10-02 DIAGNOSIS — K862 Cyst of pancreas: Secondary | ICD-10-CM | POA: Diagnosis not present

## 2022-10-02 DIAGNOSIS — H6123 Impacted cerumen, bilateral: Secondary | ICD-10-CM | POA: Diagnosis not present

## 2022-10-03 DIAGNOSIS — H6123 Impacted cerumen, bilateral: Secondary | ICD-10-CM | POA: Insufficient documentation

## 2022-10-03 DIAGNOSIS — Z87442 Personal history of urinary calculi: Secondary | ICD-10-CM | POA: Insufficient documentation

## 2022-10-03 HISTORY — DX: Impacted cerumen, bilateral: H61.23

## 2022-10-03 NOTE — Assessment & Plan Note (Signed)
Documented history of recurrent kidney stones of unknown composition. At this time, I am unable to provide specific recommendations for return prevention as I am not sure what has caused these.  Plan: _ General dietary recommendations provided for common causes of kidney stone formation. _Continue to drink plenty of water

## 2022-10-03 NOTE — Progress Notes (Signed)
Orma Render, DNP, AGNP-c [place] [address] [place], [place] [address] [phone]  Subjective:   Heather Brown is a 77 y.o. female presents to day for evaluation of: Impacted cerumen bilaterally.   She also has concerns and questions about kidney stone recurrence and pancreatic growth.   PMH, Medications, and Allergies reviewed and updated in chart as appropriate.   ROS negative except for what is listed in HPI. Objective:  BP 132/82   Pulse 66   Wt 162 lb 12.8 oz (73.8 kg)   LMP 07/30/1980   BMI 29.78 kg/m  Physical Exam Vitals and nursing note reviewed.  Constitutional:      General: She is not in acute distress.    Appearance: Normal appearance.  HENT:     Head: Normocephalic and atraumatic.     Right Ear: There is impacted cerumen.     Left Ear: There is impacted cerumen.     Ears:     Comments: Following gentle lavage with warm water and manual extraction of cerumen, both ear canals clear with no erythema. TM are intact and clear bilaterally.     Nose: Nose normal.     Mouth/Throat:     Mouth: Mucous membranes are moist.  Eyes:     Pupils: Pupils are equal, round, and reactive to light.  Cardiovascular:     Rate and Rhythm: Normal rate.  Pulmonary:     Effort: Pulmonary effort is normal.  Musculoskeletal:        General: Normal range of motion.     Cervical back: Normal range of motion.  Skin:    General: Skin is warm.     Capillary Refill: Capillary refill takes less than 2 seconds.  Neurological:     General: No focal deficit present.     Mental Status: She is alert.  Psychiatric:        Mood and Affect: Mood normal.           Assessment & Plan:   Problem List Items Addressed This Visit     Pancreatic cyst    The patient exhibits a pancreatic growth, presumed to be a cyst seen on imaging. Repeat imaging shows no changes, indicating this is currently not a cause for concern. A follow-up MRI has been scheduled  for six months from now with GI. Plan: - Continue to monitor growth/cyst with serial MRI - Reassurance provided - Currently, no dietary restrictions are advised.      Impacted cerumen of both ears - Primary    Indication: Cerumen impaction of the ear(s)  Medical necessity statement: On physical examination, cerumen impairs clinically significant portions of the external auditory canal, and tympanic membrane. Noted obstructive, copious cerumen that cannot be removed without magnification and instrumentations requiring professional removal.   Consent: Discussed benefits and risks of procedure and verbal consent obtained  Procedure: Patient was prepped for the procedure. Otoscope utilized to assess and take note of the ear canal, the tympanic membrane, and the presence, amount, and placement of the cerumen.  Gentle irrigation with water at body temperature and soft plastic curette utilized to remove impacted cerumen.  Excess water drained by gravity and ear canal(s) dried with clean guaze.  Post procedure examination: Otoscopic examination reveals complete cerumen removal with no damage to the auditory canal, tympanic membrane, or surrounding tissue.  Patient tolerated procedure well.   Post procedure instructions: Patient made aware that they may experience temporary vertigo, temporary changes in hearing, and temporary discomfort.  If these symptom last for more than 24 hours to call the clinic or proceed to the ED for further evaluation. Discussed avoiding placing objects into the ear canal for cleaning.       History of nephrolithiasis    Documented history of recurrent kidney stones of unknown composition. At this time, I am unable to provide specific recommendations for return prevention as I am not sure what has caused these.  Plan: _ General dietary recommendations provided for common causes of kidney stone formation. _Continue to drink plenty of water         Orma Render,  DNP, AGNP-c 10/03/2022  8:13 PM    History, Medications, Surgery, SDOH, and Family History reviewed and updated as appropriate.

## 2022-10-03 NOTE — Assessment & Plan Note (Signed)
Indication: Cerumen impaction of the ear(s)  Medical necessity statement: On physical examination, cerumen impairs clinically significant portions of the external auditory canal, and tympanic membrane. Noted obstructive, copious cerumen that cannot be removed without magnification and instrumentations requiring professional removal.   Consent: Discussed benefits and risks of procedure and verbal consent obtained  Procedure: Patient was prepped for the procedure. Otoscope utilized to assess and take note of the ear canal, the tympanic membrane, and the presence, amount, and placement of the cerumen.  Gentle irrigation with water at body temperature and soft plastic curette utilized to remove impacted cerumen.  Excess water drained by gravity and ear canal(s) dried with clean guaze.  Post procedure examination: Otoscopic examination reveals complete cerumen removal with no damage to the auditory canal, tympanic membrane, or surrounding tissue.  Patient tolerated procedure well.   Post procedure instructions: Patient made aware that they may experience temporary vertigo, temporary changes in hearing, and temporary discomfort. If these symptom last for more than 24 hours to call the clinic or proceed to the ED for further evaluation. Discussed avoiding placing objects into the ear canal for cleaning.

## 2022-10-03 NOTE — Assessment & Plan Note (Signed)
The patient exhibits a pancreatic growth, presumed to be a cyst seen on imaging. Repeat imaging shows no changes, indicating this is currently not a cause for concern. A follow-up MRI has been scheduled for six months from now with GI. Plan: - Continue to monitor growth/cyst with serial MRI - Reassurance provided - Currently, no dietary restrictions are advised.

## 2022-11-06 ENCOUNTER — Ambulatory Visit
Admission: RE | Admit: 2022-11-06 | Discharge: 2022-11-06 | Disposition: A | Discharge status: Home / Self Care | Payer: Medicare Other | Source: Ambulatory Visit | Attending: Endocrinology | Admitting: Endocrinology

## 2022-11-06 DIAGNOSIS — E041 Nontoxic single thyroid nodule: Secondary | ICD-10-CM

## 2022-11-14 DIAGNOSIS — E039 Hypothyroidism, unspecified: Secondary | ICD-10-CM | POA: Diagnosis not present

## 2022-11-21 ENCOUNTER — Telehealth: Payer: Self-pay | Admitting: Nurse Practitioner

## 2022-11-21 DIAGNOSIS — E041 Nontoxic single thyroid nodule: Secondary | ICD-10-CM | POA: Diagnosis not present

## 2022-11-21 DIAGNOSIS — D45 Polycythemia vera: Secondary | ICD-10-CM | POA: Diagnosis not present

## 2022-11-21 DIAGNOSIS — E039 Hypothyroidism, unspecified: Secondary | ICD-10-CM | POA: Diagnosis not present

## 2022-11-21 NOTE — Telephone Encounter (Signed)
Called patient to schedule Medicare Annual Wellness Visit (AWV). Left message for patient to call back and schedule Medicare Annual Wellness Visit (AWV).  Last date of AWV: 11/10/21  Please schedule an appointment at any time with [facility].  If any questions, please contact me at [phone].  Thank you ,  Rudell Cobb AWV direct phone # [phone]

## 2022-12-17 ENCOUNTER — Encounter: Payer: Self-pay | Admitting: Nurse Practitioner

## 2022-12-17 ENCOUNTER — Ambulatory Visit (INDEPENDENT_AMBULATORY_CARE_PROVIDER_SITE_OTHER): Payer: Medicare Other | Admitting: Nurse Practitioner

## 2022-12-17 VITALS — BP 130/96 | HR 92 | Ht 61.25 in | Wt 158.0 lb

## 2022-12-17 DIAGNOSIS — I10 Essential (primary) hypertension: Secondary | ICD-10-CM | POA: Diagnosis not present

## 2022-12-17 DIAGNOSIS — M810 Age-related osteoporosis without current pathological fracture: Secondary | ICD-10-CM | POA: Diagnosis not present

## 2022-12-17 DIAGNOSIS — K862 Cyst of pancreas: Secondary | ICD-10-CM | POA: Diagnosis not present

## 2022-12-17 DIAGNOSIS — E559 Vitamin D deficiency, unspecified: Secondary | ICD-10-CM | POA: Diagnosis not present

## 2022-12-17 DIAGNOSIS — E7800 Pure hypercholesterolemia, unspecified: Secondary | ICD-10-CM

## 2022-12-17 DIAGNOSIS — Z0000 Encounter for general adult medical examination without abnormal findings: Secondary | ICD-10-CM | POA: Diagnosis not present

## 2022-12-17 MED ORDER — VALSARTAN 160 MG PO TABS: 160.0000 mg | ORAL_TABLET | Freq: Every day | ORAL | 3 refills | Status: DC

## 2022-12-17 NOTE — Progress Notes (Signed)
[facility]  MEDICARE [place] WELLNESS VISIT  01/08/2023  Subjective:  Heather Brown is a 77 y.o. female patient of Loucinda Croy, Sung Amabile, NP who had a Medicare Annual Wellness Visit today. Heather Brown is Retired and lives with their spouse.  she reports that she is socially active and does interact with friends/family regularly. she is moderately physically active and enjoys reading and taking drives.  Heather Brown reports the following updates: Pancreatic growth She mentions a concern about growth on her pancreas identified on October 27 with routine imaging.  This is led to a recommendation for Dr. Tomasa Rand to monitor the growth, with a follow-up MRI scheduled in about a month.  Medication management She reports she has resumed taking sertraline to help manage her stress and mention it aids in calming her mind in improving her sleep.  She inquires about implications of missing a dose of sertraline.  We discussed this today.  Blood pressure She discusses her blood pressure concerns, noting occasional high readings at a current prescription involving amlodipine and valsartan, which is being adjusted to valsartan alone.  She also mentions past issues with other blood pressure medications that cause side effects.  Upcoming test She mentions a recent thyroid test and the need for further test to check cholesterol, blood sugar, kidney function, and vitamin D levels, and she is scheduled for an upcoming appointment with Dr. Myna Hidalgo.  Patient Care Team: Katlynne Mckercher, Sung Amabile, NP as PCP - General (Nurse Practitioner)     01/04/2023   12:09 PM 12/17/2022   11:32 AM 07/05/2022   12:19 PM 01/03/2022    5:05 PM 11/22/2021   10:24 AM 09/11/2021    2:52 PM 08/23/2021   11:10 AM  Advanced Directives  Does Patient Have a Medical Advance Directive? Yes Yes Yes No Yes Yes Yes  Type of Estate agent of [place];Living will Healthcare Power of [place];Living will Healthcare Power of  [place];Living will  Living will;Healthcare Power of [employer] Power of [place];Living will Healthcare Power of [place];Living will  Does patient want to make changes to medical advance directive?  Yes (ED - Information included in AVS) No - Patient declined  No - Patient declined Yes (Inpatient - patient requests chaplain consult to change a medical advance directive)   Copy of Healthcare Power of Attorney in Chart? No - copy requested No - copy requested No - copy requested        Hospital Utilization Over the Past 12 Months: # of hospitalizations or ER visits: 0 # of surgeries: 2  Review of Systems    Patient reports that her overall health is worse when compared to last year.  Review of Systems: Negative except pancreatic cysts  All other systems negative.  Pain Assessment 0/10  Current Medications & Allergies (verified) Allergies as of 12/17/2022       Reactions   Sulfa Antibiotics Hives   Cephalexin Rash   Face turned bright red.   Diazepam Other (See Comments)        Medication List        Accurate as of Dec 17, 2022 11:59 PM. If you have any questions, ask your nurse or doctor.          aspirin 81 MG tablet Take 81 mg by mouth daily.   azelastine 0.1 % nasal spray Commonly known as: ASTELIN Place 2 sprays into both nostrils 2 (two) times daily. Use in each nostril as directed   betamethasone dipropionate 0.05 % ointment Commonly known  as: DIPROLENE Apply topically 2 (two) times daily.   Biotin 2500 MCG Caps Take 1 tablet by mouth daily.   Caltrate Bone Health 600-20 MG-MCG Chew Generic drug: Calcium Carb-Cholecalciferol Chew by mouth daily.   Claritin 10 MG tablet Generic drug: loratadine Take by mouth at bedtime.   diclofenac 50 MG EC tablet Commonly known as: VOLTAREN Take 50 mg by mouth 2 (two) times daily.   famotidine 20 MG tablet Commonly known as: Pepcid Take 1 tablet (20 mg total) by mouth every 12 (twelve) hours as  needed for heartburn or indigestion.   fluconazole 150 MG tablet Commonly known as: DIFLUCAN Take one tablet by mouth at the first sign of symptoms of yeast. If no resolution, repeat dose in 72 hours.   fluticasone 50 MCG/ACT nasal spray Commonly known as: FLONASE Place 1 spray into both nostrils daily as needed.   hydrochlorothiazide 25 MG tablet Commonly known as: HYDRODIURIL Take 25 mg by mouth daily.   JOINT FORMULA PO Take 1 capsule by mouth 1 day or 1 dose.   levothyroxine 75 MCG tablet Commonly known as: SYNTHROID 75 mcg daily.   multivitamin capsule Take 1 capsule by mouth daily.   sertraline 50 MG tablet Commonly known as: ZOLOFT Take 50 mg by mouth daily.   tretinoin 0.05 % cream Commonly known as: RETIN-A Apply pea sized amount to affected area of face nightly as needed.   valsartan 160 MG tablet Commonly known as: DIOVAN Take 1 tablet (160 mg total) by mouth daily. Started by: Tollie Eth, NP   Vitamin D3 50 MCG (2000 UT) Tabs Take 2,000 Units by mouth daily.        History (reviewed): Past Medical History:  Diagnosis Date   ACE-inhibitor cough    Dyspareunia    dryness with intercourse   Endometriosis 1982   found at [facility]   GERD (gastroesophageal reflux disease)    Heart murmur    History of left shoulder fracture 2017   Hypothyroidism    Left ACL tear    Osteopenia 2009   Pneumonia    Polycythemia vera (HCC) 12/02/2017   PONV (postoperative nausea and vomiting)    Thyroid nodule    followed by Dr Talmage Nap   Urinary incontinence    with coughing/sneezing   Past Surgical History:  Procedure Laterality Date   ABDOMINAL HYSTERECTOMY  07/30/1980   [facility]--for abnormal paps/endometriosis   AUGMENTATION MAMMAPLASTY  07/31/1975   BREAST IMPLANT EXCHANGE  10/2020   COLONOSCOPY WITH PROPOFOL N/A 10/29/2013   Procedure: COLONOSCOPY WITH PROPOFOL;  Surgeon: Petra Kuba, MD;  Location: [facility] ENDOSCOPY;  Service: Endoscopy;  Laterality: N/A;    COLONOSCOPY WITH PROPOFOL N/A 12/22/2014   Procedure: COLONOSCOPY WITH PROPOFOL;  Surgeon: Vida Rigger, MD;  Location: [facility] ENDOSCOPY;  Service: Endoscopy;  Laterality: N/A;  ultra slim scope   COSMETIC SURGERY  11/28/2011   face lift,neck lift, eye lift--Dr. Benna Dunks   dilatation and curettage  07/30/1980   HOT HEMOSTASIS N/A 12/22/2014   Procedure: HOT HEMOSTASIS (ARGON PLASMA COAGULATION/BICAP);  Surgeon: Vida Rigger, MD;  Location: [facility] ENDOSCOPY;  Service: Endoscopy;  Laterality: N/A;   TONSILLECTOMY     Family History  Problem Relation Age of Onset   Cancer Mother    Cervical cancer Mother    Diabetes Father        adult onset   Stroke Father    Thyroid disease Sister        hyperthyroid   Asthma Sister  Thyroid disease Sister        hypothyroid   Cancer Maternal Grandmother    Stomach cancer Maternal Grandmother    Social History   Socioeconomic History   Marital status: Married    Spouse name: Not on file   Number of children: 2   Years of education: Not on file   Highest education level: Not on file  Occupational History   Occupation: retired  Tobacco Use   Smoking status: Never   Smokeless tobacco: Never  Vaping Use   Vaping Use: Never used  Substance and Sexual Activity   Alcohol use: Not Currently    Alcohol/week: 1.0 standard drink of alcohol    Types: 1 Glasses of wine per week   Drug use: No   Sexual activity: Yes    Partners: Male    Birth control/protection: Surgical    Comment: [facility]  Other Topics Concern   Not on file  Social History Narrative   Not on file   Social Determinants of Health   Financial Resource Strain: Low Risk  (09/11/2021)   Overall Financial Resource Strain (CARDIA)    Difficulty of Paying Living Expenses: Not hard at all  Food Insecurity: No Food Insecurity (11/10/2021)   Hunger Vital Sign    Worried About Running Out of Food in the Last Year: Never true    Ran Out of Food in the Last Year: Never true  Transportation Needs: No  Transportation Needs (11/10/2021)   PRAPARE - Administrator, Civil Service (Medical): No    Lack of Transportation (Non-Medical): No  Physical Activity: Inactive (09/11/2021)   Exercise Vital Sign    Days of Exercise per Week: 0 days    Minutes of Exercise per Session: 0 min  Stress: No Stress Concern Present (11/10/2021)   [employer] of Occupational Health - Occupational Stress Questionnaire    Feeling of Stress : Not at all  Social Connections: Moderately Integrated (09/11/2021)   Social Connection and Isolation Panel [NHANES]    Frequency of Communication with Friends and Family: More than three times a week    Frequency of Social Gatherings with Friends and Family: More than three times a week    Attends Religious Services: Never    Database administrator or Organizations: Yes    Attends Engineer, structural: More than 4 times per year    Marital Status: Married    Activities of Daily Living    12/17/2022   11:33 AM  In your present state of health, do you have any difficulty performing the following activities:  Hearing? 1  Vision? 0  Difficulty concentrating or making decisions? 0  Walking or climbing stairs? 0  Dressing or bathing? 0  Doing errands, shopping? 0  Preparing Food and eating ? N  Using the Toilet? N  In the past six months, have you accidently leaked urine? Y  Do you have problems with loss of bowel control? N  Managing your Medications? N  Managing your Finances? N  Housekeeping or managing your Housekeeping? N    Patient Education/Literacy How often do you need to have someone help you when you read instructions, pamphlets, or other written materials from your doctor or pharmacy?: 1 - Never  Exercise Current Exercise Habits: The patient does not participate in regular exercise at present, Exercise limited by: Other - see comments  Diet Patient reports consuming  2-3  meals a day and 1-2 snack(s) a day Patient reports that  her primary diet is: Regular Patient reports that she does have regular access to food.   Depression Screen    12/17/2022   11:31 AM 11/10/2021    1:07 PM 11/08/2021    9:49 AM 09/11/2021    2:50 PM 07/12/2021    3:49 PM 03/02/2021    9:02 AM 01/25/2021    9:00 AM  PHQ 2/9 Scores  PHQ - 2 Score 0 0 0 0 4 0 0  PHQ- 9 Score     10 1   Exception Documentation   Medical reason         Fall Risk    12/17/2022   11:31 AM 11/08/2021    9:48 AM 09/11/2021    2:42 PM 07/12/2021    3:50 PM 03/02/2021    9:02 AM  Fall Risk   Falls in the past year? 0 0 1 0 1  Number falls in past yr: 0 0 1 0 1  Injury with Fall? 0 0 0 0 0  Risk for fall due to : No Fall Risks No Fall Risks History of fall(s) No Fall Risks No Fall Risks  Follow up Falls evaluation completed Falls evaluation completed;Education provided Falls evaluation completed;Education provided;Falls prevention discussed Falls evaluation completed;Education provided Falls evaluation completed     Objective:   BP (!) 130/96 Comment: 129 96 on machine  Pulse 92   Ht 5' 1.25" (1.556 m)   Wt 158 lb (71.7 kg)   LMP 07/30/1980   BMI 29.61 kg/m   Last Weight  Most recent update: 12/17/2022 11:40 AM    Weight  71.7 kg (158 lb)             Body mass index is 29.61 kg/m.  Hearing/Vision  Heather Brown did not have difficulty with hearing/understanding during the face-to-face interview Heather Brown did not have difficulty with her vision during the face-to-face interview Reports that she has had a formal eye exam by an eye care professional within the past year Reports that she has not had a formal hearing evaluation within the past year  Cognitive Function:    12/17/2022   11:57 AM 11/10/2021    1:13 PM 09/11/2021    2:52 PM  6CIT Screen  What Year? 0 points 0 points 0 points  What month? 0 points 0 points 0 points  What time? 0 points 0 points 0 points  Count back from 20 0 points 0 points 0 points  Months in reverse 0 points 0 points 0  points  Repeat phrase 0 points 0 points 0 points  Total Score 0 points 0 points 0 points    Normal Cognitive Function Screening: Yes (Normal:0-7, Significant for Dysfunction: >8)  Immunization & Health Maintenance Record Immunization History  Administered Date(s) Administered   Influenza, High Dose Seasonal PF 07/17/2016   Influenza-Unspecified 04/29/2021   PFIZER(Purple Top)SARS-COV-2 Vaccination 09/06/2019, 10/01/2019, 05/30/2020   Tdap 03/11/2013    Health Maintenance  Topic Date Due   Zoster Vaccines- Shingrix (1 of 2) Never done   Pneumonia Vaccine 15+ Years old (1 of 1 - PCV) Never done   COVID-19 Vaccine (4 - 2023-24 season) 03/30/2022   INFLUENZA VACCINE  02/28/2023   DTaP/Tdap/Td (2 - Td or Tdap) 03/12/2023   Medicare Annual Wellness (AWV)  12/17/2023   DEXA SCAN  Completed   HPV VACCINES  Aged Out   Colonoscopy  Discontinued   Hepatitis C Screening  Discontinued       Assessment  This is a routine  wellness examination for Heather Brown.  Health Maintenance: Due or Overdue Health Maintenance Due  Topic Date Due   Zoster Vaccines- Shingrix (1 of 2) Never done   Pneumonia Vaccine 51+ Years old (1 of 1 - PCV) Never done   COVID-19 Vaccine (4 - 2023-24 season) 03/30/2022    Heather Brown does not need a referral for Community Assistance: Care Management:   not applicable Social Work:    not applicable Prescription Assistance:  not applicable Nutrition/Diabetes Education:  not applicable   Plan:  Personalized Goals  Goals Addressed             This Visit's Progress    Patient Stated       Exercise more       Personalized Health Maintenance & Screening Recommendations  Vaccine management discussed.  We will work to obtain records.  Lung Cancer Screening Recommended: no (Low Dose CT Chest recommended if Age 94-80 years, 30 pack-year currently smoking OR have quit w/in past 15 years) Hepatitis C Screening recommended: no HIV  Screening recommended: no  Advanced Directives: Written information was not given per the patient's request.  Referrals & Orders Orders Placed This Encounter  Procedures   MR Abdomen W Wo Contrast   Hemoglobin A1c   Comprehensive metabolic panel   Lipid Cascade   VITAMIN D 25 Hydroxy (Vit-D Deficiency, Fractures)   Lipoprotein Analysis, by NMR    Follow-up Plan Follow-up with [place] Throckmorton, Sung Amabile, NP as planned Schedule MRI in 6 months   I have personally reviewed and noted the following in the patient's chart:   Medical and social history Use of alcohol, tobacco or illicit drugs  Current medications and supplements Functional ability and status Nutritional status Physical activity Advanced directives List of other physicians Hospitalizations, surgeries, and ER visits in previous 12 months Vitals Screenings to include cognitive, depression, and falls Referrals and appointments  In addition, I have reviewed and discussed with patient certain preventive protocols, quality metrics, and best practice recommendations. A written personalized care plan for preventive services as well as general preventive health recommendations were provided to patient.     Tollie Eth, DNP, AGNP-c   01/08/2023

## 2022-12-18 LAB — COMPREHENSIVE METABOLIC PANEL
ALT: 25 IU/L (ref 0–32)
AST: 25 IU/L (ref 0–40)
Albumin/Globulin Ratio: 1.6 (ref 1.2–2.2)
Albumin: 4.4 g/dL (ref 3.8–4.8)
Alkaline Phosphatase: 175 IU/L — ABNORMAL HIGH (ref 44–121)
BUN/Creatinine Ratio: 18 (ref 12–28)
BUN: 13 mg/dL (ref 8–27)
Bilirubin Total: 0.8 mg/dL (ref 0.0–1.2)
CO2: 21 mmol/L (ref 20–29)
Calcium: 10.6 mg/dL — ABNORMAL HIGH (ref 8.7–10.3)
Chloride: 104 mmol/L (ref 96–106)
Creatinine, Ser: 0.72 mg/dL (ref 0.57–1.00)
Globulin, Total: 2.7 g/dL (ref 1.5–4.5)
Glucose: 96 mg/dL (ref 70–99)
Potassium: 4.7 mmol/L (ref 3.5–5.2)
Sodium: 140 mmol/L (ref 134–144)
Total Protein: 7.1 g/dL (ref 6.0–8.5)
eGFR: 86 mL/min/{1.73_m2} (ref >59)

## 2022-12-18 LAB — LIPOPROTEIN ANALYSIS, BY NMR
HDL Particle Number: 28.9 umol/L — ABNORMAL LOW (ref >=30.5)
LDL Particle Number: 1050 nmol/L — ABNORMAL HIGH (ref <1000)
LDL Size: 21.2 nm (ref >20.5)
LP-IR Score: 47 — ABNORMAL HIGH (ref <=45)
Small LDL Particle Number: 312 nmol/L (ref <=527)

## 2022-12-18 LAB — LIPID CASCADE
Cholesterol, Total: 177 mg/dL (ref 100–199)
HDL: 42 mg/dL (ref >39)
LDL Chol Calc (NIH): 109 mg/dL — ABNORMAL HIGH (ref 0–99)
LDL/HDL Ratio: 2.6 ratio (ref 0.0–3.2)
Total Non-HDL-Chol (LDL+VLDL): 135 mg/dL — ABNORMAL HIGH (ref 0–129)
Triglycerides: 146 mg/dL (ref 0–149)

## 2022-12-18 LAB — HEMOGLOBIN A1C
Est. average glucose Bld gHb Est-mCnc: 131 mg/dL
Hgb A1c MFr Bld: 6.2 % — ABNORMAL HIGH (ref 4.8–5.6)

## 2022-12-18 LAB — VITAMIN D 25 HYDROXY (VIT D DEFICIENCY, FRACTURES): Vit D, 25-Hydroxy: 30.7 ng/mL (ref 30.0–100.0)

## 2022-12-20 ENCOUNTER — Other Ambulatory Visit: Payer: Self-pay

## 2023-01-02 ENCOUNTER — Other Ambulatory Visit: Payer: Medicare Other

## 2023-01-03 LAB — PTH, INTACT AND CALCIUM
Calcium: 10.4 mg/dL — ABNORMAL HIGH (ref 8.7–10.3)
PTH: 63 pg/mL (ref 15–65)

## 2023-01-04 ENCOUNTER — Inpatient Hospital Stay: Payer: Medicare Other

## 2023-01-04 ENCOUNTER — Inpatient Hospital Stay: Payer: Medicare Other | Admitting: Hematology & Oncology

## 2023-01-04 ENCOUNTER — Inpatient Hospital Stay: Payer: Medicare Other | Attending: Hematology & Oncology

## 2023-01-04 ENCOUNTER — Encounter: Payer: Self-pay | Admitting: Hematology & Oncology

## 2023-01-04 VITALS — HR 74 | Temp 98.5°F | Resp 20 | Ht 61.24 in | Wt 158.0 lb

## 2023-01-04 DIAGNOSIS — Z7982 Long term (current) use of aspirin: Secondary | ICD-10-CM | POA: Diagnosis not present

## 2023-01-04 DIAGNOSIS — D45 Polycythemia vera: Secondary | ICD-10-CM | POA: Diagnosis not present

## 2023-01-04 LAB — CBC WITH DIFFERENTIAL (CANCER CENTER ONLY)
Abs Immature Granulocytes: 0.04 10*3/uL (ref 0.00–0.07)
Basophils Absolute: 0 10*3/uL (ref 0.0–0.1)
Basophils Relative: 1 %
Eosinophils Absolute: 0.1 10*3/uL (ref 0.0–0.5)
Eosinophils Relative: 3 %
HCT: 41.8 % (ref 36.0–46.0)
Hemoglobin: 12.3 g/dL (ref 12.0–15.0)
Immature Granulocytes: 1 %
Lymphocytes Relative: 35 %
Lymphs Abs: 1.5 10*3/uL (ref 0.7–4.0)
MCH: 23.1 pg — ABNORMAL LOW (ref 26.0–34.0)
MCHC: 29.4 g/dL — ABNORMAL LOW (ref 30.0–36.0)
MCV: 78.6 fL — ABNORMAL LOW (ref 80.0–100.0)
Monocytes Absolute: 0.5 10*3/uL (ref 0.1–1.0)
Monocytes Relative: 13 %
Neutro Abs: 1.9 10*3/uL (ref 1.7–7.7)
Neutrophils Relative %: 47 %
Platelet Count: 291 10*3/uL (ref 150–400)
RBC: 5.32 MIL/uL — ABNORMAL HIGH (ref 3.87–5.11)
RDW: 16.4 % — ABNORMAL HIGH (ref 11.5–15.5)
WBC Count: 4.1 10*3/uL (ref 4.0–10.5)
nRBC: 0 % (ref 0.0–0.2)

## 2023-01-04 LAB — CMP (CANCER CENTER ONLY)
ALT: 25 U/L (ref 0–44)
AST: 27 U/L (ref 15–41)
Albumin: 3.7 g/dL (ref 3.5–5.0)
Alkaline Phosphatase: 116 U/L (ref 38–126)
Anion gap: 10 (ref 5–15)
BUN: 19 mg/dL (ref 8–23)
CO2: 23 mmol/L (ref 22–32)
Calcium: 9.8 mg/dL (ref 8.9–10.3)
Chloride: 107 mmol/L (ref 98–111)
Creatinine: 0.66 mg/dL (ref 0.44–1.00)
GFR, Estimated: >60 mL/min (ref >60)
Glucose, Bld: 101 mg/dL — ABNORMAL HIGH (ref 70–99)
Potassium: 3.9 mmol/L (ref 3.5–5.1)
Sodium: 140 mmol/L (ref 135–145)
Total Bilirubin: 0.5 mg/dL (ref 0.3–1.2)
Total Protein: 7.3 g/dL (ref 6.5–8.1)

## 2023-01-04 LAB — IRON AND IRON BINDING CAPACITY (CC-WL,HP ONLY)
Iron: 34 ug/dL (ref 28–170)
Saturation Ratios: 7 % — ABNORMAL LOW (ref 10.4–31.8)
TIBC: 486 ug/dL — ABNORMAL HIGH (ref 250–450)
UIBC: 452 ug/dL — ABNORMAL HIGH (ref 148–442)

## 2023-01-04 LAB — RETICULOCYTES
Immature Retic Fract: 14.5 % (ref 2.3–15.9)
RBC.: 5.36 MIL/uL — ABNORMAL HIGH (ref 3.87–5.11)
Retic Count, Absolute: 61.6 10*3/uL (ref 19.0–186.0)
Retic Ct Pct: 1.2 % (ref 0.4–3.1)

## 2023-01-04 LAB — LACTATE DEHYDROGENASE: LDH: 151 U/L (ref 98–192)

## 2023-01-04 LAB — FERRITIN: Ferritin: 7 ng/mL — ABNORMAL LOW (ref 11–307)

## 2023-01-04 NOTE — Progress Notes (Signed)
Hematology and Oncology Follow Up Visit  Heather Brown [id number] [birth year] 77 y.o. 01/04/2023   Principle Diagnosis:  Polycythemia vera -- DNMT3A (+),  Epo = 5.7  Current Therapy:   Phlebotomy to maintain hematocrit below 45% Aspirin 81 mg p.o. daily     Interim History:  Ms. Argento is back for follow-up.  We saw her 6 months ago.  Since then, she has been doing pretty well.  She has been having some issues with her abdomen.  She apparently had MRI of the abdomen back in January.  She had a cyst on the pancreas.  This measured 2.8 cm.  There was some mildly enlarged mesenteric lymph nodes which were stable from her prior CT scan.  She is due for another MRI I think in 2 or 3 weeks.  She has had no other issues.  She has had no problems with COVID.  She has had no nausea or vomiting.  There is been no change in bowel or bladder habits.  When we last saw her back in December, her ferritin was 7 with an iron saturation of 9%.  She has had no rashes.  There is been no leg swelling.  She has had no obvious bleeding.  Overall, her husband is doing better.  I think he may have myasthenia gravis but this seems to be under good control.  Overall, I would say that her performance status is probably ECOG 1.  Medications:  Current Outpatient Medications:    aspirin 81 MG tablet, Take 81 mg by mouth daily., Disp: , Rfl:    azelastine (ASTELIN) 0.1 % nasal spray, Place 2 sprays into both nostrils 2 (two) times daily. Use in each nostril as directed, Disp: 30 mL, Rfl: 6   betamethasone dipropionate (DIPROLENE) 0.05 % ointment, Apply topically 2 (two) times daily., Disp: , Rfl:    Biotin 2500 MCG CAPS, Take 1 tablet by mouth daily., Disp: , Rfl:    calcium carbonate (OSCAL) 1500 (600 Ca) MG TABS tablet, Take 1,500 mg by mouth daily., Disp: , Rfl:    Cholecalciferol (VITAMIN D3) 50 MCG (2000 UT) TABS, Take 2,000 Units by mouth daily., Disp: , Rfl:    famotidine (PEPCID) 20 MG tablet, Take  1 tablet (20 mg total) by mouth every 12 (twelve) hours as needed for heartburn or indigestion., Disp: 90 tablet, Rfl: 3   fluticasone (FLONASE) 50 MCG/ACT nasal spray, Place 1 spray into both nostrils daily as needed., Disp: , Rfl:    levothyroxine (SYNTHROID) 75 MCG tablet, 75 mcg daily., Disp: , Rfl:    loratadine (CLARITIN) 10 MG tablet, Take by mouth at bedtime., Disp: , Rfl:    Nutritional Supplements (JOINT FORMULA PO), Take 1 capsule by mouth 1 day or 1 dose., Disp: , Rfl:    sertraline (ZOLOFT) 50 MG tablet, Take 50 mg by mouth daily., Disp: , Rfl:    tretinoin (RETIN-A) 0.05 % cream, Apply pea sized amount to affected area of face nightly as needed., Disp: , Rfl: 5   diclofenac (VOLTAREN) 50 MG EC tablet, Take 50 mg by mouth 2 (two) times daily. (Patient not taking: Reported on 10/02/2022), Disp: , Rfl:    Multiple Vitamin (MULTIVITAMIN) capsule, Take 1 capsule by mouth daily. (Patient not taking: Reported on 01/04/2023), Disp: , Rfl:    valsartan (DIOVAN) 160 MG tablet, Take 1 tablet (160 mg total) by mouth daily. (Patient not taking: Reported on 01/04/2023), Disp: 90 tablet, Rfl: 3  Allergies:  Allergies  Allergen  Reactions   Sulfa Antibiotics Hives   Cephalexin Rash    Face turned bright red.   Diazepam Other (See Comments)    Past Medical History, Surgical history, Social history, and Family History were reviewed and updated.  Review of Systems: Review of Systems  HENT:  Negative.    Eyes: Negative.   Respiratory:  Positive for shortness of breath.   Cardiovascular: Negative.   Gastrointestinal:  Positive for abdominal distention.  Endocrine: Negative.   Musculoskeletal:  Positive for arthralgias, flank pain, myalgias and neck stiffness.  Skin: Negative.   Neurological: Negative.   Hematological: Negative.   Psychiatric/Behavioral: Negative.      Physical Exam:  height is 5' 1.24" (1.555 m) and weight is 158 lb (71.7 kg). Her oral temperature is 98.5 F (36.9 C). Her  pulse is 74. Her respiration is 20 and oxygen saturation is 97%.   Wt Readings from Last 3 Encounters:  01/04/23 158 lb (71.7 kg)  12/17/22 158 lb (71.7 kg)  10/02/22 162 lb 12.8 oz (73.8 kg)    Physical Exam Vitals reviewed.  HENT:     Head: Normocephalic and atraumatic.  Eyes:     Pupils: Pupils are equal, round, and reactive to light.  Cardiovascular:     Rate and Rhythm: Normal rate and regular rhythm.     Heart sounds: Normal heart sounds.  Pulmonary:     Effort: Pulmonary effort is normal.     Breath sounds: Normal breath sounds.  Abdominal:     General: Bowel sounds are normal.     Palpations: Abdomen is soft.  Musculoskeletal:        General: No tenderness or deformity. Normal range of motion.     Cervical back: Normal range of motion.     Comments: Her big toe on her right foot is a little swollen.  There is some erythema associated with this.  She does have decent range of motion.  There is no tenderness to palpation over the area of erythema.  Lymphadenopathy:     Cervical: No cervical adenopathy.  Skin:    General: Skin is warm and dry.     Findings: No erythema or rash.  Neurological:     Mental Status: She is alert and oriented to person, place, and time.  Psychiatric:        Behavior: Behavior normal.        Thought Content: Thought content normal.        Judgment: Judgment normal.      Lab Results  Component Value Date   WBC 4.1 01/04/2023   HGB 12.3 01/04/2023   HCT 41.8 01/04/2023   MCV 78.6 (L) 01/04/2023   PLT 291 01/04/2023     Chemistry      Component Value Date/Time   NA 140 01/04/2023 1127   NA 140 12/17/2022 1220   K 3.9 01/04/2023 1127   CL 107 01/04/2023 1127   CO2 23 01/04/2023 1127   BUN 19 01/04/2023 1127   BUN 13 12/17/2022 1220   CREATININE 0.66 01/04/2023 1127   CREATININE 0.79 03/10/2014 0836      Component Value Date/Time   CALCIUM 9.8 01/04/2023 1127   ALKPHOS 116 01/04/2023 1127   AST 27 01/04/2023 1127   ALT 25  01/04/2023 1127   BILITOT 0.5 01/04/2023 1127       Impression and Plan: Ms. Leon is a 77 year old white female.  She has polycythemia vera.  So far, she is done very nicely.  Her iron levels are low so we really have not had to phlebotomize her.  She is taking baby aspirin which I think is a very good idea.  I am glad that her husband is doing okay.  I know that she will have a wonderful Summer and Fall.  I will see her back around the [place].     Josph Macho, MD 6/7/202412:48 PM

## 2023-01-17 ENCOUNTER — Ambulatory Visit (HOSPITAL_COMMUNITY)
Admission: RE | Admit: 2023-01-17 | Discharge: 2023-01-17 | Disposition: A | Discharge status: Home / Self Care | Payer: Medicare Other | Source: Ambulatory Visit | Attending: Nurse Practitioner | Admitting: Nurse Practitioner

## 2023-01-17 DIAGNOSIS — K862 Cyst of pancreas: Secondary | ICD-10-CM | POA: Diagnosis not present

## 2023-01-17 MED ORDER — GADOBUTROL 1 MMOL/ML IV SOLN
7.0000 mL | Freq: Once | INTRAVENOUS | Status: AC | PRN
  Administered 2023-01-17: 7 mL via INTRAVENOUS

## 2023-01-22 IMAGING — CT CT ABD-PELV W/ CM
2 of 5 series · 16 of 46 positions shown, 18 images · IV contrast (Omnipaque)
Comparison: Abdominal ultrasound dated September 25, 2016.

CLINICAL DATA: Sudden onset left lower quadrant abdominal pain and
vomiting after eating a big lunch.

EXAM:
CT ABDOMEN AND PELVIS WITH CONTRAST
TECHNIQUE: Multidetector CT imaging of the abdomen and pelvis was performed
using the standard protocol following bolus administration of
intravenous contrast.

[Series 2: axial st · axial · 0.88mm/px · z∈[+705,+1095]mm · 13 of 88 slices shown, 15 images]
[im 5/88  soft-tissue]
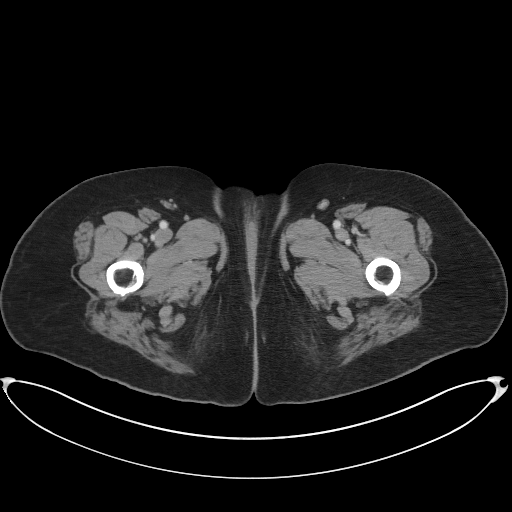
[im 5/88  bone]
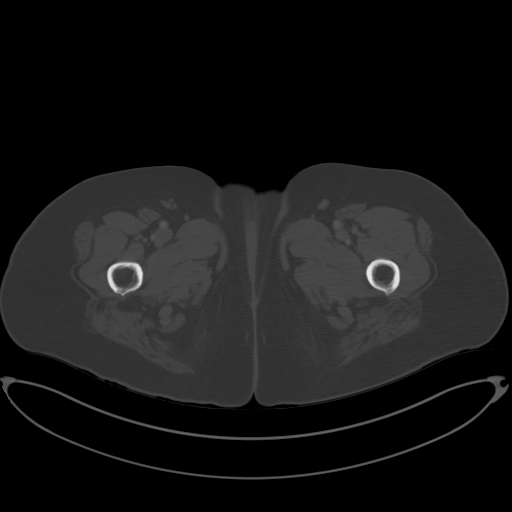
[im 14/88  soft-tissue]
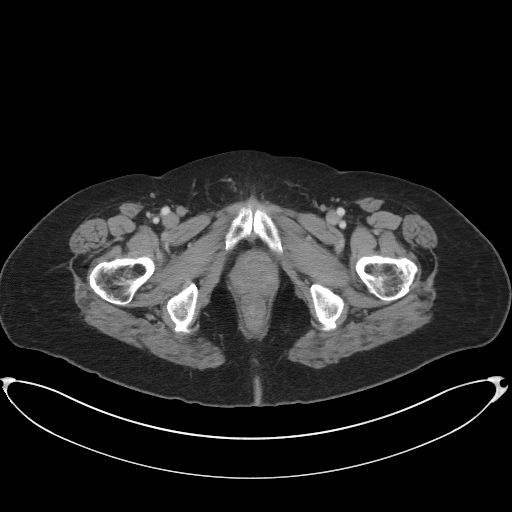
[im 18/88  soft-tissue]
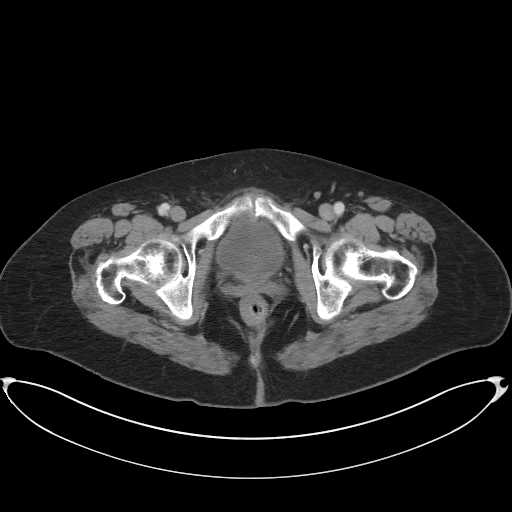
[im 27/88  soft-tissue]
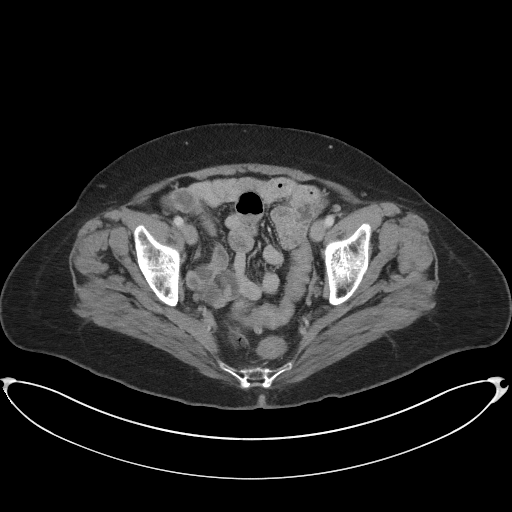
[im 31/88  soft-tissue]
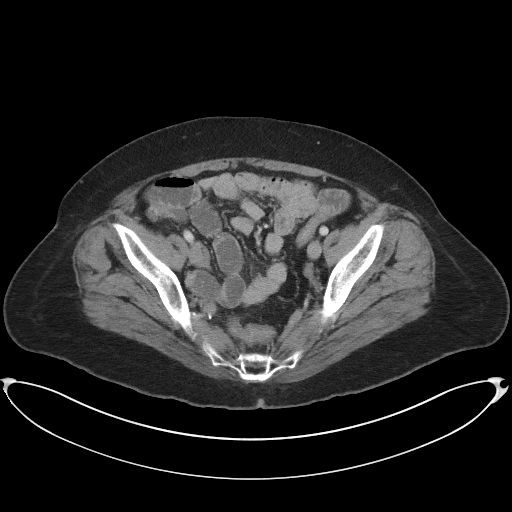
[im 40/88  soft-tissue]
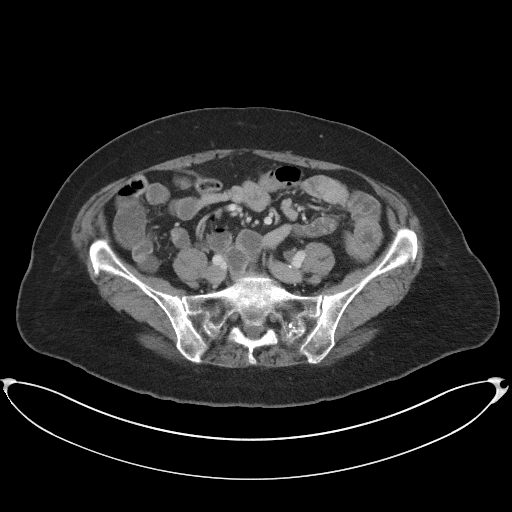
[im 44/88  soft-tissue]
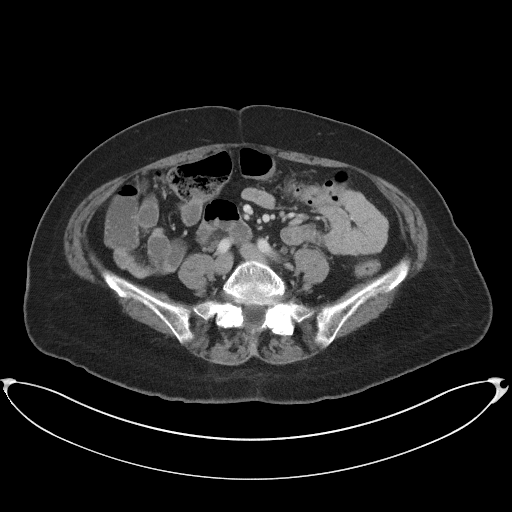
[im 48/88  soft-tissue]
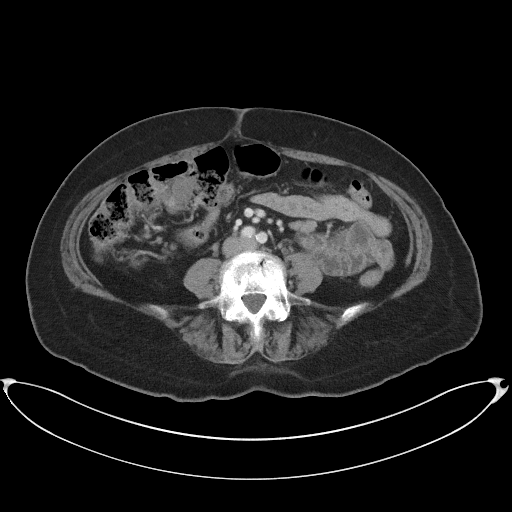
[im 57/88  soft-tissue]
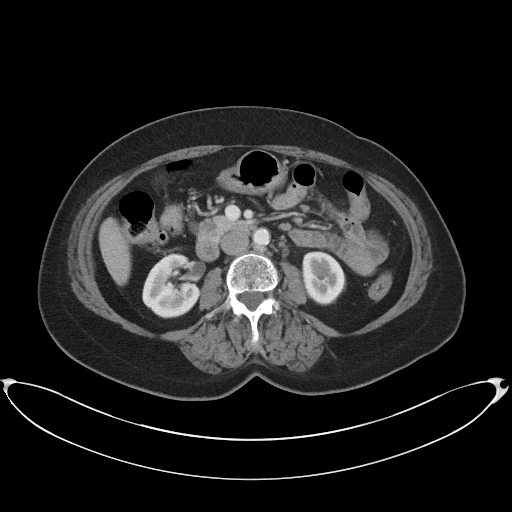
[im 57/88  bone]
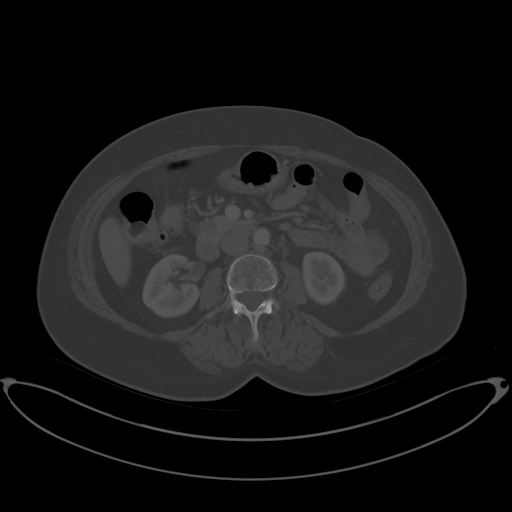
[im 61/88  soft-tissue]
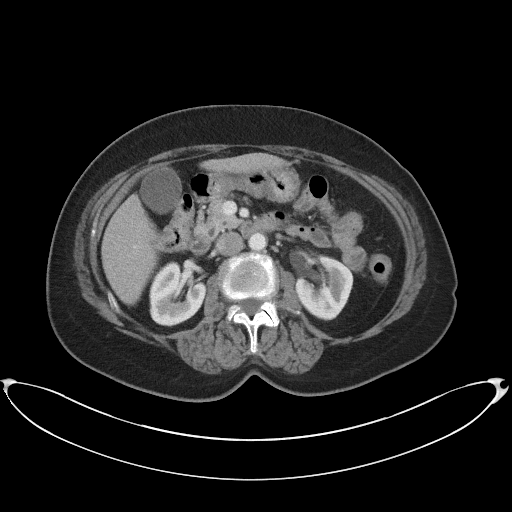
[im 70/88  soft-tissue]
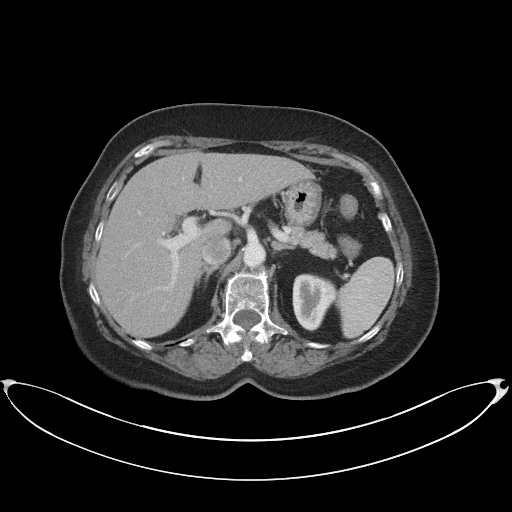
[im 74/88  soft-tissue]
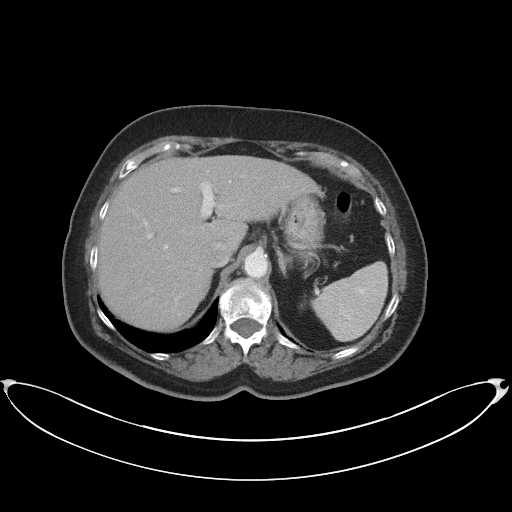
[im 83/88  soft-tissue]
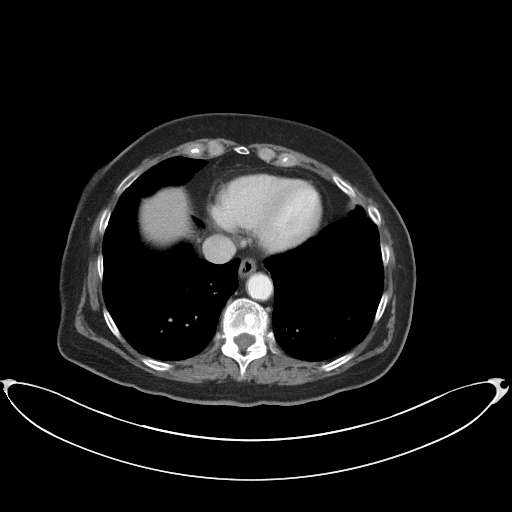

[Series 5: coronal st · coronal · 0.72mm/px · 3 of 101 slices shown]
[im 34/101  soft-tissue]
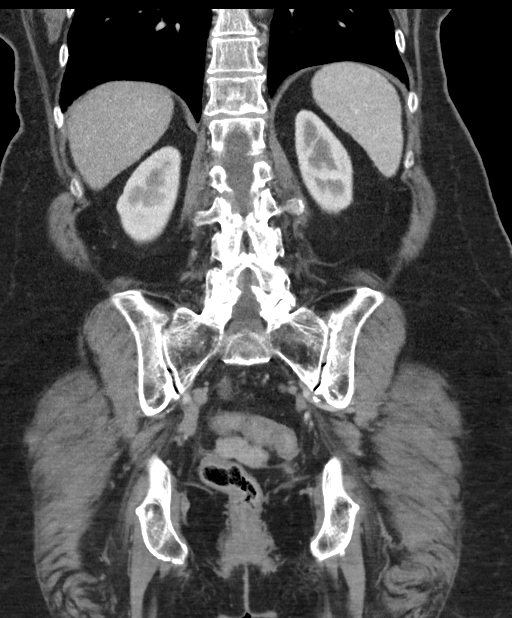
[im 45/101  soft-tissue]
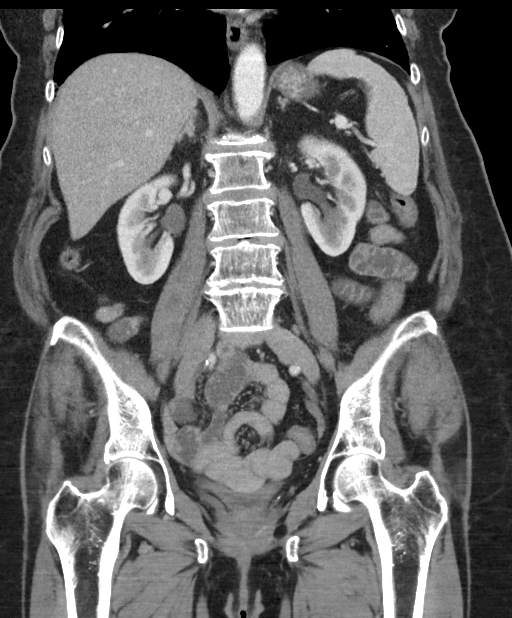
[im 56/101  soft-tissue]
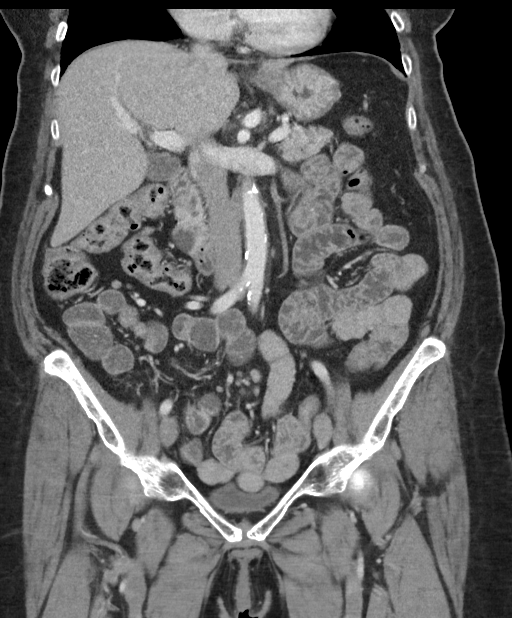

[16 of 46 positions shown; findings below may reference images not displayed]

RADIATION DOSE REDUCTION: This exam was performed according to the
departmental dose-optimization program which includes automated
exposure control, adjustment of the mA and/or kV according to
patient size and/or use of iterative reconstruction technique.

CONTRAST:  100mL OMNIPAQUE IOHEXOL 300 MG/ML  SOLN
FINDINGS: Lower chest: No acute abnormality.

Hepatobiliary: 1.0 cm simple cyst in the medial tip of the left
liver. No other focal liver abnormality. Gallbladder fundal
adenomyomatosis. No gallstones, gallbladder wall thickening, or
biliary dilatation.

Pancreas: 1.6 x 2.7 cm thin walled cystic lesion in the uncinate
process of the pancreas without mural nodule or peripheral
calcification (series 2, image 33). No ductal dilatation or
peripancreatic inflammatory change.

Spleen: Normal in size without focal abnormality.

Adrenals/Urinary Tract: Adrenal glands are unremarkable. No renal
mass. 2 mm calculus at the left UVJ (series 5, image 43). No
hydronephrosis. Bladder is mostly decompressed.

Stomach/Bowel: Stomach is within normal limits. Appendix appears
normal. No evidence of bowel wall thickening, distention, or
inflammatory changes.

Vascular/Lymphatic: Aortic atherosclerosis. No enlarged abdominal or
pelvic lymph nodes.

Reproductive: Status post hysterectomy. No adnexal masses.

Other: No abdominal wall hernia or abnormality. No abdominopelvic
ascites. No pneumoperitoneum.

Musculoskeletal: No acute or significant osseous findings.
IMPRESSION: 1. 2 mm calculus at the left UVJ. No hydronephrosis.
2. 2.7 cm cystic lesion in the uncinate process of the pancreas with
low risk imaging features. Recommend follow-up pancreatic protocol
CT or MRI with and without contrast in 6 months.
3. Aortic Atherosclerosis ([id number].W).

## 2023-01-25 ENCOUNTER — Encounter: Payer: Self-pay | Admitting: Nurse Practitioner

## 2023-02-14 ENCOUNTER — Encounter (HOSPITAL_BASED_OUTPATIENT_CLINIC_OR_DEPARTMENT_OTHER): Payer: Self-pay | Admitting: Obstetrics & Gynecology

## 2023-02-14 ENCOUNTER — Other Ambulatory Visit (HOSPITAL_BASED_OUTPATIENT_CLINIC_OR_DEPARTMENT_OTHER): Payer: Self-pay | Admitting: Obstetrics & Gynecology

## 2023-02-14 ENCOUNTER — Other Ambulatory Visit (HOSPITAL_BASED_OUTPATIENT_CLINIC_OR_DEPARTMENT_OTHER): Payer: Medicare Other

## 2023-02-14 ENCOUNTER — Ambulatory Visit (HOSPITAL_BASED_OUTPATIENT_CLINIC_OR_DEPARTMENT_OTHER)
Admission: RE | Admit: 2023-02-14 | Discharge: 2023-02-14 | Disposition: A | Discharge status: Home / Self Care | Payer: Medicare Other | Source: Ambulatory Visit | Attending: Obstetrics & Gynecology | Admitting: Obstetrics & Gynecology

## 2023-02-14 ENCOUNTER — Ambulatory Visit (INDEPENDENT_AMBULATORY_CARE_PROVIDER_SITE_OTHER): Payer: Medicare Other | Admitting: Obstetrics & Gynecology

## 2023-02-14 VITALS — BP 137/80 | HR 75 | Ht 61.0 in | Wt 158.6 lb

## 2023-02-14 DIAGNOSIS — Z87442 Personal history of urinary calculi: Secondary | ICD-10-CM

## 2023-02-14 DIAGNOSIS — Z1231 Encounter for screening mammogram for malignant neoplasm of breast: Secondary | ICD-10-CM

## 2023-02-14 DIAGNOSIS — Z01419 Encounter for gynecological examination (general) (routine) without abnormal findings: Secondary | ICD-10-CM

## 2023-02-14 DIAGNOSIS — M810 Age-related osteoporosis without current pathological fracture: Secondary | ICD-10-CM | POA: Diagnosis not present

## 2023-02-14 DIAGNOSIS — Z90710 Acquired absence of both cervix and uterus: Secondary | ICD-10-CM | POA: Diagnosis not present

## 2023-02-14 NOTE — Progress Notes (Signed)
77 y.o. G2P2002 Married White or Caucasian female here for breast and pelvic exam.  She has ruptured implant at last mammogram in 2021 and had to have implants replaced in 2022.  Concerned about screening.  Current guidelines discussed.  She will update that this year.  Orders placed.  Has questions about calcium and parathyroid testing.  Reviewed lab work.  She sees Dr. Talmage Nap.  Recommended she follow up with Dr. Talmage Nap for additional evaluation.  Denies vaginal bleeding.  Patient's last menstrual period was 07/30/1980.          Sexually active: Yes.    H/O STD:  no  Health Maintenance: PCP:  Enid Skeens.  Last wellness appt was 01/04/2023.  Did blood work at that appt:  yes Vaccines are up to date:  declined doing shingles Colonoscopy:  2019, follow up 5 years MMG:  02/02/20 BMD:  ordered for pt Last pap smear:  2019.   H/o abnormal pap smear:  remote hx   reports that she has never smoked. She has never used smokeless tobacco. She reports that she does not currently use alcohol after a past usage of about 1.0 standard drink of alcohol per week. She reports that she does not use drugs.  Past Medical History:  Diagnosis Date   ACE-inhibitor cough    Dyspareunia    dryness with intercourse   Endometriosis 1982   found at [facility]   GERD (gastroesophageal reflux disease)    Heart murmur    History of left shoulder fracture 2017   Hypothyroidism    Left ACL tear    Osteopenia 2009   Pneumonia    Polycythemia vera (HCC) 12/02/2017   PONV (postoperative nausea and vomiting)    Thyroid nodule    followed by Dr Talmage Nap   Urinary incontinence    with coughing/sneezing    Past Surgical History:  Procedure Laterality Date   ABDOMINAL HYSTERECTOMY  07/30/1980   [facility]--for abnormal paps/endometriosis   AUGMENTATION MAMMAPLASTY  07/31/1975   BREAST IMPLANT EXCHANGE  10/2020   COLONOSCOPY WITH PROPOFOL N/A 10/29/2013   Procedure: COLONOSCOPY WITH PROPOFOL;  Surgeon: Petra Kuba, MD;  Location:  [facility] ENDOSCOPY;  Service: Endoscopy;  Laterality: N/A;   COLONOSCOPY WITH PROPOFOL N/A 12/22/2014   Procedure: COLONOSCOPY WITH PROPOFOL;  Surgeon: Vida Rigger, MD;  Location: [facility] ENDOSCOPY;  Service: Endoscopy;  Laterality: N/A;  ultra slim scope   COSMETIC SURGERY  11/28/2011   face lift,neck lift, eye lift--Dr. Benna Dunks   dilatation and curettage  07/30/1980   HOT HEMOSTASIS N/A 12/22/2014   Procedure: HOT HEMOSTASIS (ARGON PLASMA COAGULATION/BICAP);  Surgeon: Vida Rigger, MD;  Location: [facility] ENDOSCOPY;  Service: Endoscopy;  Laterality: N/A;   TONSILLECTOMY      Current Outpatient Medications  Medication Sig Dispense Refill   aspirin 81 MG tablet Take 81 mg by mouth daily.     betamethasone dipropionate (DIPROLENE) 0.05 % ointment Apply topically 2 (two) times daily.     Biotin 2500 MCG CAPS Take 1 tablet by mouth daily.     calcium carbonate (OSCAL) 1500 (600 Ca) MG TABS tablet Take 1,500 mg by mouth daily.     Cholecalciferol (VITAMIN D3) 50 MCG (2000 UT) TABS Take 2,000 Units by mouth daily.     famotidine (PEPCID) 20 MG tablet Take 1 tablet (20 mg total) by mouth every 12 (twelve) hours as needed for heartburn or indigestion. 90 tablet 3   fluticasone (FLONASE) 50 MCG/ACT nasal spray Place 1 spray into both nostrils daily  as needed.     levothyroxine (SYNTHROID) 75 MCG tablet 75 mcg daily.     loratadine (CLARITIN) 10 MG tablet Take by mouth at bedtime.     Nutritional Supplements (JOINT FORMULA PO) Take 1 capsule by mouth 1 day or 1 dose.     sertraline (ZOLOFT) 50 MG tablet Take 50 mg by mouth daily.     tretinoin (RETIN-A) 0.05 % cream Apply pea sized amount to affected area of face nightly as needed.  5   azelastine (ASTELIN) 0.1 % nasal spray Place 2 sprays into both nostrils 2 (two) times daily. Use in each nostril as directed (Patient not taking: Reported on 02/14/2023) 30 mL 6   Multiple Vitamin (MULTIVITAMIN) capsule Take 1 capsule by mouth daily. (Patient not taking: Reported on  01/04/2023)     valsartan (DIOVAN) 160 MG tablet Take 1 tablet (160 mg total) by mouth daily. (Patient not taking: Reported on 01/04/2023) 90 tablet 3   No current facility-administered medications for this visit.    Family History  Problem Relation Age of Onset   Cancer Mother    Cervical cancer Mother    Diabetes Father        adult onset   Stroke Father    Thyroid disease Sister        hyperthyroid   Asthma Sister    Thyroid disease Sister        hypothyroid   Cancer Maternal Grandmother    Stomach cancer Maternal Grandmother     Review of Systems  Constitutional: Negative.   Genitourinary: Negative.     Exam:   BP 137/80   Pulse 75   Ht 5\' 1"  (1.549 m)   Wt 158 lb 9.6 oz (71.9 kg)   LMP 07/30/1980   BMI 29.97 kg/m   Height: 5\' 1"  (154.9 cm)  General appearance: alert, cooperative and appears stated age Breasts: normal appearance, no masses or tenderness Abdomen: soft, non-tender; bowel sounds normal; no masses,  no organomegaly Lymph nodes: Cervical, supraclavicular, and axillary nodes normal.  No abnormal inguinal nodes palpated Neurologic: Grossly normal  Pelvic: External genitalia:  no lesions              Urethra:  normal appearing urethra with no masses, tenderness or lesions              Bartholins and Skenes: normal                 Vagina: normal appearing vagina with atrophic changes and no discharge, no lesions              Cervix: absent              Pap taken: No. Bimanual Exam:  Uterus:  uterus absent              Adnexa: no mass, fullness, tenderness               Rectovaginal: Confirms               Anus:  normal sphincter tone, no lesions  Chaperone, Ina Homes, CMA, was present for exam.  Assessment/Plan: 1. Encntr for gyn exam (general) (routine) w/o abn findings - Pap smear not indicated - Mammogram ordered - Colonoscopy 2019, follow up 5 years - Bone mineral density ordered - lab work done with PCP, [place] Dec Early - vaccines  reviewed/updated  2. Age-related osteoporosis without current pathological fracture - DG BONE DENSITY (DXA); Future  3. Encounter for screening  mammogram for malignant neoplasm of breast - MM 3D SCREENING MAMMOGRAM BILATERAL BREAST; Future  4. H/O abdominal hysterectomy  5. History of nephrolithiasis

## 2023-02-19 ENCOUNTER — Ambulatory Visit (HOSPITAL_BASED_OUTPATIENT_CLINIC_OR_DEPARTMENT_OTHER)
Admission: RE | Admit: 2023-02-19 | Discharge: 2023-02-19 | Disposition: A | Discharge status: Home / Self Care | Payer: Medicare Other | Source: Ambulatory Visit | Attending: Obstetrics & Gynecology | Admitting: Obstetrics & Gynecology

## 2023-02-19 DIAGNOSIS — M810 Age-related osteoporosis without current pathological fracture: Secondary | ICD-10-CM | POA: Diagnosis not present

## 2023-03-06 DIAGNOSIS — K080 Exfoliation of teeth due to systemic causes: Secondary | ICD-10-CM | POA: Diagnosis not present

## 2023-03-13 ENCOUNTER — Telehealth: Payer: Self-pay | Admitting: Nurse Practitioner

## 2023-03-13 NOTE — Telephone Encounter (Signed)
Pt called and says she never discussed with Dr.Balan about her June labs pertaining to her thyroids. She is asking if her lab work and comment from Maralyn Sago can be sent to [facility] so he can take a look and go from there. She says she can't see it in her My Chart anymore and wanted to know if labs and comments can also be resent to her as well?

## 2023-03-20 DIAGNOSIS — L820 Inflamed seborrheic keratosis: Secondary | ICD-10-CM | POA: Diagnosis not present

## 2023-03-20 DIAGNOSIS — D225 Melanocytic nevi of trunk: Secondary | ICD-10-CM | POA: Diagnosis not present

## 2023-03-20 DIAGNOSIS — D2272 Melanocytic nevi of left lower limb, including hip: Secondary | ICD-10-CM | POA: Diagnosis not present

## 2023-03-20 DIAGNOSIS — D2271 Melanocytic nevi of right lower limb, including hip: Secondary | ICD-10-CM | POA: Diagnosis not present

## 2023-03-20 DIAGNOSIS — L821 Other seborrheic keratosis: Secondary | ICD-10-CM | POA: Diagnosis not present

## 2023-03-22 DIAGNOSIS — E039 Hypothyroidism, unspecified: Secondary | ICD-10-CM | POA: Diagnosis not present

## 2023-03-27 ENCOUNTER — Ambulatory Visit (HOSPITAL_BASED_OUTPATIENT_CLINIC_OR_DEPARTMENT_OTHER): Payer: Medicare Other | Admitting: Obstetrics & Gynecology

## 2023-03-27 ENCOUNTER — Encounter (HOSPITAL_BASED_OUTPATIENT_CLINIC_OR_DEPARTMENT_OTHER): Payer: Self-pay | Admitting: Obstetrics & Gynecology

## 2023-03-27 VITALS — BP 139/71 | HR 71 | Ht 61.0 in | Wt 162.6 lb

## 2023-03-27 DIAGNOSIS — R7989 Other specified abnormal findings of blood chemistry: Secondary | ICD-10-CM | POA: Diagnosis not present

## 2023-03-27 DIAGNOSIS — M810 Age-related osteoporosis without current pathological fracture: Secondary | ICD-10-CM | POA: Diagnosis not present

## 2023-03-27 DIAGNOSIS — E039 Hypothyroidism, unspecified: Secondary | ICD-10-CM | POA: Diagnosis not present

## 2023-03-27 DIAGNOSIS — D45 Polycythemia vera: Secondary | ICD-10-CM | POA: Diagnosis not present

## 2023-03-27 DIAGNOSIS — E041 Nontoxic single thyroid nodule: Secondary | ICD-10-CM | POA: Diagnosis not present

## 2023-03-27 DIAGNOSIS — E210 Primary hyperparathyroidism: Secondary | ICD-10-CM | POA: Diagnosis not present

## 2023-03-27 NOTE — Progress Notes (Signed)
Subjective:    89 yrs Married Caucasian G2P2002  female here to discuss recent BMD obtained 02/19/2023 showing osteoporosis with worse score of -2.7 in right hip.  She wants to discuss treatment.  She is currently undergoing evaluation for possible hyperparathyroidism.  Is unsure what is going to be done next but was advised she will need a scan of her neck next.  Discussed with pt she is likely going to be tested for parathyroid adenoma and then may need removal.    Do feel treatment is appropriate and we did discuss treatment options but parathyroid adenoma needs to be evaluated and treated first.  Then can proceed with treatment of osteoporosis.  She has questions about testing and then follow up after that.  Dr. Talmage Nap is managing this but pt is aware she will likely need to see general surgeon as well.  She knows I do not see Dr. Willeen Cass notes as her practice does not use [facility] but she will keep me aware of process.    Current calcium and Vit D intake: 1500mg  daily and Vit D 2000IU daily.    Past Medical History:  Diagnosis Date   ACE-inhibitor cough    Dyspareunia    dryness with intercourse   Endometriosis 1982   found at [facility]   GERD (gastroesophageal reflux disease)    Heart murmur    History of left shoulder fracture 2017   Hypothyroidism    Left ACL tear    Osteopenia 2009   Pneumonia    Polycythemia vera (HCC) 12/02/2017   PONV (postoperative nausea and vomiting)    Thyroid nodule    followed by Dr Talmage Nap   Urinary incontinence    with coughing/sneezing   Current Outpatient Medications on File Prior to Visit  Medication Sig Dispense Refill   aspirin 81 MG tablet Take 81 mg by mouth daily.     azelastine (ASTELIN) 0.1 % nasal spray Place 2 sprays into both nostrils 2 (two) times daily. Use in each nostril as directed 30 mL 6   betamethasone dipropionate (DIPROLENE) 0.05 % ointment Apply topically 2 (two) times daily.     Biotin 2500 MCG CAPS Take 1 tablet by mouth daily.      calcium carbonate (OSCAL) 1500 (600 Ca) MG TABS tablet Take 1,500 mg by mouth daily.     Cholecalciferol (VITAMIN D3) 50 MCG (2000 UT) TABS Take 2,000 Units by mouth daily.     famotidine (PEPCID) 20 MG tablet Take 1 tablet (20 mg total) by mouth every 12 (twelve) hours as needed for heartburn or indigestion. 90 tablet 3   fluticasone (FLONASE) 50 MCG/ACT nasal spray Place 1 spray into both nostrils daily as needed.     levothyroxine (SYNTHROID) 75 MCG tablet 75 mcg daily.     loratadine (CLARITIN) 10 MG tablet Take by mouth at bedtime.     Multiple Vitamin (MULTIVITAMIN) capsule Take 1 capsule by mouth daily.     Nutritional Supplements (JOINT FORMULA PO) Take 1 capsule by mouth 1 day or 1 dose.     sertraline (ZOLOFT) 50 MG tablet Take 50 mg by mouth daily.     tretinoin (RETIN-A) 0.05 % cream Apply pea sized amount to affected area of face nightly as needed.  5   valsartan (DIOVAN) 160 MG tablet Take 1 tablet (160 mg total) by mouth daily. 90 tablet 3   No current facility-administered medications on file prior to visit.   Allergies  Allergen Reactions   Sulfa Antibiotics Hives  Cephalexin Rash    Face turned bright red.   Diazepam Other (See Comments)    Review of Systems Pertinent items are noted in HPI.     Objective:   PHYSICAL EXAM BP 139/71 (BP Location: Left Arm, Patient Position: Sitting, Cuff Size: Normal)   Pulse 71   Ht 5\' 1"  (1.549 m)   Wt 162 lb 9.6 oz (73.8 kg)   LMP 07/30/1980   BMI 30.72 kg/m  General appearance: alert and no distress  Imaging Bone Density: Spine T Score: -2.5, Hip T Score: -2.7   Done on 02/19/2023 FRAX score:  not calculated due to osteoporosis                                        Assessment:   Osteoporosis with T score -2.7  Elevated PTH   Plan:   1.  Treatment options with medications discussed.  2.   Pt is going to complete evaluation of possible parathyroid adenoma and let me know if she does or does not need surgery  3.   Continue Vit D.  Advised to stop additional calcium until PTH evaluation is completed  4.  30 minutes of weight bearing exercise at least 3 times weekly recommended.

## 2023-03-29 DIAGNOSIS — E210 Primary hyperparathyroidism: Secondary | ICD-10-CM | POA: Diagnosis not present

## 2023-04-08 ENCOUNTER — Other Ambulatory Visit (HOSPITAL_COMMUNITY): Payer: Self-pay | Admitting: Endocrinology

## 2023-04-08 DIAGNOSIS — E210 Primary hyperparathyroidism: Secondary | ICD-10-CM

## 2023-04-15 ENCOUNTER — Encounter (HOSPITAL_COMMUNITY)
Admission: RE | Admit: 2023-04-15 | Discharge: 2023-04-15 | Disposition: A | Discharge status: Home / Self Care | Payer: Medicare Other | Source: Ambulatory Visit | Attending: Endocrinology | Admitting: Endocrinology

## 2023-04-15 DIAGNOSIS — E210 Primary hyperparathyroidism: Secondary | ICD-10-CM | POA: Diagnosis not present

## 2023-04-15 DIAGNOSIS — E213 Hyperparathyroidism, unspecified: Secondary | ICD-10-CM | POA: Diagnosis not present

## 2023-04-15 MED ORDER — TECHNETIUM TC 99M SESTAMIBI - CARDIOLITE
25.0000 | Freq: Once | INTRAVENOUS | Status: AC | PRN
  Administered 2023-04-15: 25 via INTRAVENOUS

## 2023-04-25 ENCOUNTER — Other Ambulatory Visit (HOSPITAL_BASED_OUTPATIENT_CLINIC_OR_DEPARTMENT_OTHER): Payer: Self-pay | Admitting: Nurse Practitioner

## 2023-04-25 NOTE — Telephone Encounter (Signed)
Last apt 12/17/22

## 2023-05-23 ENCOUNTER — Telehealth: Payer: Self-pay | Admitting: Nurse Practitioner

## 2023-05-23 DIAGNOSIS — K219 Gastro-esophageal reflux disease without esophagitis: Secondary | ICD-10-CM

## 2023-05-23 NOTE — Telephone Encounter (Signed)
Pt called stating she is on famotidine and it is not helping and wanted to know if you could give her something else

## 2023-05-24 MED ORDER — PANTOPRAZOLE SODIUM 40 MG PO TBEC
40.0000 mg | DELAYED_RELEASE_TABLET | Freq: Every day | ORAL | 1 refills | Status: DC
Start: 2023-05-24 — End: 2023-11-18

## 2023-05-24 NOTE — Telephone Encounter (Signed)
Can you let Heather Brown know that I have sent in pantoprazole once daily for her to take for reflux. This is taken daily and stronger than the famotidine. It can take a week to fully be effective, but she can use famotidine as needed while waiting.

## 2023-06-04 ENCOUNTER — Ambulatory Visit: Payer: Self-pay | Admitting: Surgery

## 2023-06-04 DIAGNOSIS — E210 Primary hyperparathyroidism: Secondary | ICD-10-CM | POA: Diagnosis not present

## 2023-07-03 ENCOUNTER — Encounter: Payer: Self-pay | Admitting: Hematology & Oncology

## 2023-07-03 ENCOUNTER — Inpatient Hospital Stay: Payer: Medicare Other | Attending: Hematology & Oncology

## 2023-07-03 ENCOUNTER — Inpatient Hospital Stay: Payer: Medicare Other | Admitting: Hematology & Oncology

## 2023-07-03 VITALS — HR 83 | Temp 98.5°F | Resp 20 | Ht 62.0 in | Wt 162.0 lb

## 2023-07-03 DIAGNOSIS — D45 Polycythemia vera: Secondary | ICD-10-CM | POA: Insufficient documentation

## 2023-07-03 DIAGNOSIS — Z7982 Long term (current) use of aspirin: Secondary | ICD-10-CM | POA: Diagnosis not present

## 2023-07-03 LAB — CBC WITH DIFFERENTIAL (CANCER CENTER ONLY)
Abs Immature Granulocytes: 0.01 10*3/uL (ref 0.00–0.07)
Basophils Absolute: 0 10*3/uL (ref 0.0–0.1)
Basophils Relative: 1 %
Eosinophils Absolute: 0.1 10*3/uL (ref 0.0–0.5)
Eosinophils Relative: 3 %
HCT: 41.3 % (ref 36.0–46.0)
Hemoglobin: 12.5 g/dL (ref 12.0–15.0)
Immature Granulocytes: 0 %
Lymphocytes Relative: 30 %
Lymphs Abs: 1.1 10*3/uL (ref 0.7–4.0)
MCH: 23.3 pg — ABNORMAL LOW (ref 26.0–34.0)
MCHC: 30.3 g/dL (ref 30.0–36.0)
MCV: 76.9 fL — ABNORMAL LOW (ref 80.0–100.0)
Monocytes Absolute: 0.4 10*3/uL (ref 0.1–1.0)
Monocytes Relative: 11 %
Neutro Abs: 2.1 10*3/uL (ref 1.7–7.7)
Neutrophils Relative %: 55 %
Platelet Count: 286 10*3/uL (ref 150–400)
RBC: 5.37 MIL/uL — ABNORMAL HIGH (ref 3.87–5.11)
RDW: 16.3 % — ABNORMAL HIGH (ref 11.5–15.5)
WBC Count: 3.8 10*3/uL — ABNORMAL LOW (ref 4.0–10.5)
nRBC: 0 % (ref 0.0–0.2)

## 2023-07-03 LAB — IRON AND IRON BINDING CAPACITY (CC-WL,HP ONLY)
Iron: 48 ug/dL (ref 28–170)
Saturation Ratios: 10 % — ABNORMAL LOW (ref 10.4–31.8)
TIBC: 507 ug/dL — ABNORMAL HIGH (ref 250–450)
UIBC: 459 ug/dL — ABNORMAL HIGH (ref 148–442)

## 2023-07-03 LAB — CMP (CANCER CENTER ONLY)
ALT: 21 U/L (ref 0–44)
AST: 19 U/L (ref 15–41)
Albumin: 4.1 g/dL (ref 3.5–5.0)
Alkaline Phosphatase: 123 U/L (ref 38–126)
Anion gap: 8 (ref 5–15)
BUN: 19 mg/dL (ref 8–23)
CO2: 28 mmol/L (ref 22–32)
Calcium: 10.8 mg/dL — ABNORMAL HIGH (ref 8.9–10.3)
Chloride: 109 mmol/L (ref 98–111)
Creatinine: 0.76 mg/dL (ref 0.44–1.00)
GFR, Estimated: >60 mL/min (ref >60)
Glucose, Bld: 128 mg/dL — ABNORMAL HIGH (ref 70–99)
Potassium: 3.9 mmol/L (ref 3.5–5.1)
Sodium: 145 mmol/L (ref 135–145)
Total Bilirubin: 0.9 mg/dL (ref <1.2)
Total Protein: 7.2 g/dL (ref 6.5–8.1)

## 2023-07-03 LAB — RETICULOCYTES
Immature Retic Fract: 14.8 % (ref 2.3–15.9)
RBC.: 5.23 MIL/uL — ABNORMAL HIGH (ref 3.87–5.11)
Retic Count, Absolute: 55.4 10*3/uL (ref 19.0–186.0)
Retic Ct Pct: 1.1 % (ref 0.4–3.1)

## 2023-07-03 LAB — FERRITIN: Ferritin: 7 ng/mL — ABNORMAL LOW (ref 11–307)

## 2023-07-03 NOTE — Progress Notes (Signed)
Hematology and Oncology Follow Up Visit  Heather Brown [id number] Jun 18, 1946 77 y.o. 07/03/2023   Principle Diagnosis:  Polycythemia vera -- DNMT3A (+),  Epo = 5.7  Current Therapy:   Phlebotomy to maintain hematocrit below 45% Aspirin 81 mg p.o. daily     Interim History:  Heather Brown is back for follow-up.  We saw her 6 months ago.  The big news is that she is in need to have parathyroid surgery.  She apparently has some hypercalcemia.  She then had a parathyroid scan which did show an adenoma in one of the parathyroid glands.  She will have surgery on January 16.  She had an MRI of the abdomen on 01/01/2023.  This showed a stable cystic lesion in the head of the pancreas.  There are no suspicious features.  She had a small cystic lesion in the tail the pancreas.  There is recommended that 1 year follow-up be done.  Her last iron studies that were done back in June showed a ferritin of 7 with iron saturation of 7%.  She is feeling okay.  She has had no problems with bowels or bladder.  She has had some arthritis.  She thinks this might be from the parathyroid..  She has had no cough.  There is been no issues with COVID.  Overall, I would say that her performance status is probably ECOG 1.  Medications:  Current Outpatient Medications:    aspirin 81 MG tablet, Take 81 mg by mouth daily., Disp: , Rfl:    levothyroxine (SYNTHROID) 75 MCG tablet, 75 mcg daily., Disp: , Rfl:    loratadine (CLARITIN) 10 MG tablet, Take by mouth at bedtime., Disp: , Rfl:    pantoprazole (PROTONIX) 40 MG tablet, Take 1 tablet (40 mg total) by mouth daily., Disp: 90 tablet, Rfl: 1   sertraline (ZOLOFT) 50 MG tablet, Take 1 tablet (50 mg total) by mouth at bedtime., Disp: 90 tablet, Rfl: 3   azelastine (ASTELIN) 0.1 % nasal spray, Place 2 sprays into both nostrils 2 (two) times daily. Use in each nostril as directed (Patient not taking: Reported on 07/03/2023), Disp: 30 mL, Rfl: 6   betamethasone  dipropionate (DIPROLENE) 0.05 % ointment, Apply topically 2 (two) times daily. (Patient not taking: Reported on 07/03/2023), Disp: , Rfl:    calcium carbonate (OSCAL) 1500 (600 Ca) MG TABS tablet, Take 1,500 mg by mouth daily. (Patient not taking: Reported on 07/03/2023), Disp: , Rfl:    Cholecalciferol (VITAMIN D3) 50 MCG (2000 UT) TABS, Take 2,000 Units by mouth daily. (Patient not taking: Reported on 07/03/2023), Disp: , Rfl:    fluticasone (FLONASE) 50 MCG/ACT nasal spray, Place 1 spray into both nostrils daily as needed. (Patient not taking: Reported on 07/03/2023), Disp: , Rfl:    Multiple Vitamin (MULTIVITAMIN) capsule, Take 1 capsule by mouth daily. (Patient not taking: Reported on 07/03/2023), Disp: , Rfl:    Nutritional Supplements (JOINT FORMULA PO), Take 1 capsule by mouth 1 day or 1 dose. (Patient not taking: Reported on 07/03/2023), Disp: , Rfl:    tretinoin (RETIN-A) 0.05 % cream, Apply pea sized amount to affected area of face nightly as needed. (Patient not taking: Reported on 07/03/2023), Disp: , Rfl: 5   valsartan (DIOVAN) 160 MG tablet, Take 1 tablet (160 mg total) by mouth daily. (Patient not taking: Reported on 07/03/2023), Disp: 90 tablet, Rfl: 3  Allergies:  Allergies  Allergen Reactions   Sulfa Antibiotics Hives   Cephalexin Rash  Face turned bright red.   Diazepam Other (See Comments)    Past Medical History, Surgical history, Social history, and Family History were reviewed and updated.  Review of Systems: Review of Systems  HENT:  Negative.    Eyes: Negative.   Respiratory:  Positive for shortness of breath.   Cardiovascular: Negative.   Gastrointestinal:  Positive for abdominal distention.  Endocrine: Negative.   Musculoskeletal:  Positive for arthralgias, flank pain, myalgias and neck stiffness.  Skin: Negative.   Neurological: Negative.   Hematological: Negative.   Psychiatric/Behavioral: Negative.      Physical Exam:  height is 5\' 2"  (1.575 m) and weight  is 162 lb (73.5 kg). Her oral temperature is 98.5 F (36.9 C). Her blood pressure is 123/66 (pended) and her pulse is 83. Her respiration is 20 and oxygen saturation is 100%.   Wt Readings from Last 3 Encounters:  07/03/23 162 lb (73.5 kg)  03/27/23 162 lb 9.6 oz (73.8 kg)  02/14/23 158 lb 9.6 oz (71.9 kg)    Physical Exam Vitals reviewed.  HENT:     Head: Normocephalic and atraumatic.  Eyes:     Pupils: Pupils are equal, round, and reactive to light.  Cardiovascular:     Rate and Rhythm: Normal rate and regular rhythm.     Heart sounds: Normal heart sounds.  Pulmonary:     Effort: Pulmonary effort is normal.     Breath sounds: Normal breath sounds.  Abdominal:     General: Bowel sounds are normal.     Palpations: Abdomen is soft.  Musculoskeletal:        General: No tenderness or deformity. Normal range of motion.     Cervical back: Normal range of motion.  Lymphadenopathy:     Cervical: No cervical adenopathy.  Skin:    General: Skin is warm and dry.     Findings: No erythema or rash.  Neurological:     Mental Status: She is alert and oriented to person, place, and time.  Psychiatric:        Behavior: Behavior normal.        Thought Content: Thought content normal.        Judgment: Judgment normal.      Lab Results  Component Value Date   WBC 3.8 (L) 07/03/2023   HGB 12.5 07/03/2023   HCT 41.3 07/03/2023   MCV 76.9 (L) 07/03/2023   PLT 286 07/03/2023     Chemistry      Component Value Date/Time   NA 145 07/03/2023 1010   NA 140 12/17/2022 1220   K 3.9 07/03/2023 1010   CL 109 07/03/2023 1010   CO2 28 07/03/2023 1010   BUN 19 07/03/2023 1010   BUN 13 12/17/2022 1220   CREATININE 0.76 07/03/2023 1010   CREATININE 0.79 03/10/2014 0836      Component Value Date/Time   CALCIUM 10.8 (H) 07/03/2023 1010   ALKPHOS 123 07/03/2023 1010   AST 19 07/03/2023 1010   ALT 21 07/03/2023 1010   BILITOT 0.9 07/03/2023 1010       Impression and Plan: Heather Brown  is a 77 year old white female.  She has polycythemia vera.  So far, she is done very nicely.  Her iron levels are low so we really have not had to phlebotomize her.  She is taking baby aspirin which I think is a very good idea.  I do not see a problem with her having parathyroid surgery.  I really do not think  she is having issue with bleeding or recovery or infection.  We will still plan to get her back to see [place] in another 6 months.     Josph Macho, MD 12/4/202411:10 AM

## 2023-07-25 ENCOUNTER — Telehealth: Payer: Self-pay

## 2023-07-25 NOTE — Progress Notes (Signed)
   07/25/2023  Patient ID: Heather Brown, female   DOB: 17-Jan-1946, 77 y.o.   MRN: [id number]  Pharmacy Quality Measure Review  This patient is appearing on a report for being at risk of failing the adherence measure for hypertension (ACEi/ARB) medications this calendar year.   Medication: Valsartan 160mg  Last fill date: 03/16/23 for 90 day supply  Left voicemail for patient to return my call at their convenience. Per 07/03/23 visit, reported she was not taking. Unclear why.  Sherrill Raring, PharmD Clinical Pharmacist [phone]

## 2023-07-26 ENCOUNTER — Telehealth: Payer: Self-pay

## 2023-07-26 NOTE — Progress Notes (Signed)
   07/26/2023  Patient ID: Heather Brown, female   DOB: 08/03/1945, 77 y.o.   MRN: [id number]  Received voicemail from patient asking for a return call. Attempted to reach patient x 2 and left voice message.  Sherrill Raring, PharmD Clinical Pharmacist [phone]

## 2023-08-08 NOTE — Progress Notes (Addendum)
 COVID Vaccine Completed:  Yes  Date of COVID positive in last 90 days:  No  PCP - Camie Doing, NP Cardiologist - N/A  Chest x-ray -  N/A EKG - 08-09-23 Epic Stress Test - several years ago ECHO -  N/A Cardiac Cath -  N/A Pacemaker/ICD device last checked: Spinal Cord Stimulator:  N/A  Bowel Prep -  N/A  Sleep Study - N/A CPAP -   Fasting Blood Sugar - N/A Checks Blood Sugar _____ times a day  Last dose of GLP1 agonist-  N/A GLP1 instructions:  Hold 7 days before surgery    Last dose of SGLT-2 inhibitors-  N/A SGLT-2 instructions:  Hold 3 days before surgery    Blood Thinner Instructions:  Time Aspirin Instructions:  ASA 81  Last Dose: Patient has instructions at home on whether she needs to stop  Activity level:  Can go up a flight of stairs and perform activities of daily living without stopping and without symptoms of chest pain or shortness of breath.  Anesthesia review:  Murmur, evaluated by Harlene Sink, PA-C, no murmur heard.  Patient denies shortness of breath, fever, cough and chest pain at PAT appointment  Patient verbalized understanding of instructions that were given to them at the PAT appointment. Patient was also instructed that they will need to review over the PAT instructions again at home before surgery.

## 2023-08-08 NOTE — Patient Instructions (Addendum)
 SURGICAL WAITING ROOM VISITATION Patients having surgery or a procedure may have no more than 2 support people in the waiting area - these visitors may rotate.    Children under the age of 43 must have an adult with them who is not the patient.  If the patient needs to stay at the hospital during part of their recovery, the visitor guidelines for inpatient rooms apply. Pre-op nurse will coordinate an appropriate time for 1 support person to accompany patient in pre-op.  This support person may not rotate.    Please refer to the [facility] website for the visitor guidelines for Inpatients (after your surgery is over and you are in a regular room).       Your procedure is scheduled on: 08-15-23   Report to [facility] Main Entrance    Report to admitting at 5:15 AM   Call this number if you have problems the morning of surgery [phone]   Do not eat food :After Midnight.   After Midnight you may have the following liquids until 4:30 AM DAY OF SURGERY  Water Non-Citrus Juices (without pulp, NO RED-Apple, White grape, White cranberry) Black Coffee (NO MILK/CREAM OR CREAMERS, sugar ok)  Clear Tea (NO MILK/CREAM OR CREAMERS, sugar ok) regular and decaf                             Plain Jell-O (NO RED)                                           Fruit ices (not with fruit pulp, NO RED)                                     Popsicles (NO RED)                                                               Sports drinks like Gatorade (NO RED)                       If you have questions, please contact your surgeon's office.   FOLLOW  ANY ADDITIONAL PRE OP INSTRUCTIONS YOU RECEIVED FROM YOUR SURGEON'S OFFICE!!!     Oral Hygiene is also important to reduce your risk of infection.                                    Remember - BRUSH YOUR TEETH THE MORNING OF SURGERY WITH YOUR REGULAR TOOTHPASTE   Do NOT smoke after Midnight   Take these medicines the morning of surgery with A SIP  OF WATER:   Levothyroxine  Pantoprazole   Sertraline   Okay to use nasal spray  Stop all vitamins and herbal supplements 7 days before surgery                              You may not have any metal on your  body including hair pins, jewelry, and body piercing             Do not wear make-up, lotions, powders, perfumes or deodorant  Do not wear nail polish including gel and S&S, artificial/acrylic nails, or any other type of covering on natural nails including finger and toenails. If you have artificial nails, gel coating, etc. that needs to be removed by a nail salon please have this removed prior to surgery or surgery may need to be canceled/ delayed if the surgeon/ anesthesia feels like they are unable to be safely monitored.   Do not shave  48 hours prior to surgery.          Do not bring valuables to the hospital. [facility] IS NOT RESPONSIBLE   FOR VALUABLES.   Contacts, dentures or bridgework may not be worn into surgery.  DO NOT BRING YOUR HOME MEDICATIONS TO THE HOSPITAL. PHARMACY WILL DISPENSE MEDICATIONS LISTED ON YOUR MEDICATION LIST TO YOU DURING YOUR ADMISSION IN THE HOSPITAL!    Patients discharged on the day of surgery will not be allowed to drive home.  Someone NEEDS to stay with you for the first 24 hours after anesthesia.   Special Instructions: Bring a copy of your healthcare power of attorney and living will documents the day of surgery if you haven't scanned them before.              Please read over the following fact sheets you were given: IF YOU HAVE QUESTIONS ABOUT YOUR PRE-OP INSTRUCTIONS PLEASE CALL [phone] Gwen  If you received a COVID test during your pre-op visit  it is requested that you wear a mask when out in public, stay away from anyone that may not be feeling well and notify your surgeon if you develop symptoms. If you test positive for Covid or have been in contact with anyone that has tested positive in the last 10 days please notify you  surgeon.  [facility] - Preparing for Surgery Before surgery, you can play an important role.  Because skin is not sterile, your skin needs to be as free of germs as possible.  You can reduce the number of germs on your skin by washing with CHG (chlorahexidine gluconate) soap before surgery.  CHG is an antiseptic cleaner which kills germs and bonds with the skin to continue killing germs even after washing. Please DO NOT use if you have an allergy to CHG or antibacterial soaps.  If your skin becomes reddened/irritated stop using the CHG and inform your nurse when you arrive at Short Stay. Do not shave (including legs and underarms) for at least 48 hours prior to the first CHG shower.  You may shave your face/neck.  Please follow these instructions carefully:  1.  Shower with CHG Soap the night before surgery and the  morning of surgery.  2.  If you choose to wash your hair, wash your hair first as usual with your normal  shampoo.  3.  After you shampoo, rinse your hair and body thoroughly to remove the shampoo.                             4.  Use CHG as you would any other liquid soap.  You can apply chg directly to the skin and wash.  Gently with a scrungie or clean washcloth.  5.  Apply the CHG Soap to your body ONLY FROM THE NECK DOWN.  Do   not use on face/ open                           Wound or open sores. Avoid contact with eyes, ears mouth and   genitals (private parts).                       Wash face,  Genitals (private parts) with your normal soap.             6.  Wash thoroughly, paying special attention to the area where your    surgery  will be performed.  7.  Thoroughly rinse your body with warm water from the neck down.  8.  DO NOT shower/wash with your normal soap after using and rinsing off the CHG Soap.                9.  Pat yourself dry with a clean towel.            10.  Wear clean pajamas.            11.  Place clean sheets on your bed the night of your first shower and do  not  sleep with pets. Day of Surgery : Do not apply any lotions/deodorants the morning of surgery.  Please wear clean clothes to the hospital/surgery center.  FAILURE TO FOLLOW THESE INSTRUCTIONS MAY RESULT IN THE CANCELLATION OF YOUR SURGERY  PATIENT SIGNATURE_________________________________  NURSE SIGNATURE__________________________________  ________________________________________________________________________

## 2023-08-09 ENCOUNTER — Encounter (HOSPITAL_COMMUNITY): Payer: Self-pay

## 2023-08-09 ENCOUNTER — Other Ambulatory Visit: Payer: Self-pay

## 2023-08-09 ENCOUNTER — Encounter (HOSPITAL_COMMUNITY)
Admission: RE | Admit: 2023-08-09 | Discharge: 2023-08-09 | Disposition: A | Discharge status: Home / Self Care | Payer: Medicare Other | Source: Ambulatory Visit | Attending: Surgery | Admitting: Surgery

## 2023-08-09 VITALS — BP 146/83 | HR 74 | Temp 98.4°F | Resp 16 | Ht 62.0 in | Wt 159.0 lb

## 2023-08-09 DIAGNOSIS — R9431 Abnormal electrocardiogram [ECG] [EKG]: Secondary | ICD-10-CM | POA: Insufficient documentation

## 2023-08-09 DIAGNOSIS — I10 Essential (primary) hypertension: Secondary | ICD-10-CM | POA: Diagnosis not present

## 2023-08-09 DIAGNOSIS — Z01818 Encounter for other preprocedural examination: Secondary | ICD-10-CM | POA: Insufficient documentation

## 2023-08-09 DIAGNOSIS — Z01812 Encounter for preprocedural laboratory examination: Secondary | ICD-10-CM | POA: Diagnosis not present

## 2023-08-09 DIAGNOSIS — Z01810 Encounter for preprocedural cardiovascular examination: Secondary | ICD-10-CM | POA: Diagnosis not present

## 2023-08-09 HISTORY — DX: Unspecified osteoarthritis, unspecified site: M19.90

## 2023-08-09 HISTORY — DX: Essential (primary) hypertension: I10

## 2023-08-09 HISTORY — DX: Anxiety disorder, unspecified: F41.9

## 2023-08-09 HISTORY — DX: Personal history of urinary calculi: Z87.442

## 2023-08-09 HISTORY — DX: Depression, unspecified: F32.A

## 2023-08-09 HISTORY — DX: Anemia, unspecified: D64.9

## 2023-08-09 LAB — BASIC METABOLIC PANEL
Anion gap: 4 — ABNORMAL LOW (ref 5–15)
BUN: 23 mg/dL (ref 8–23)
CO2: 24 mmol/L (ref 22–32)
Calcium: 9.3 mg/dL (ref 8.9–10.3)
Chloride: 108 mmol/L (ref 98–111)
Creatinine, Ser: 0.67 mg/dL (ref 0.44–1.00)
GFR, Estimated: >60 mL/min (ref >60)
Glucose, Bld: 107 mg/dL — ABNORMAL HIGH (ref 70–99)
Potassium: 4.3 mmol/L (ref 3.5–5.1)
Sodium: 136 mmol/L (ref 135–145)

## 2023-08-09 LAB — CBC
HCT: 40.8 % (ref 36.0–46.0)
Hemoglobin: 12.1 g/dL (ref 12.0–15.0)
MCH: 23.1 pg — ABNORMAL LOW (ref 26.0–34.0)
MCHC: 29.7 g/dL — ABNORMAL LOW (ref 30.0–36.0)
MCV: 78 fL — ABNORMAL LOW (ref 80.0–100.0)
Platelets: 278 10*3/uL (ref 150–400)
RBC: 5.23 MIL/uL — ABNORMAL HIGH (ref 3.87–5.11)
RDW: 16.2 % — ABNORMAL HIGH (ref 11.5–15.5)
WBC: 4.8 10*3/uL (ref 4.0–10.5)
nRBC: 0 % (ref 0.0–0.2)

## 2023-08-10 ENCOUNTER — Encounter (HOSPITAL_COMMUNITY): Payer: Self-pay | Admitting: Surgery

## 2023-08-10 DIAGNOSIS — E210 Primary hyperparathyroidism: Secondary | ICD-10-CM | POA: Diagnosis present

## 2023-08-10 NOTE — H&P (Signed)
 REFERRING PHYSICIAN: Tommas Littie POUR, MD  PROVIDER: Keion Neels OZELL SPINNER, MD   Chief Complaint: New Consultation ( Primary hyperparathyroidsim)  History of Present Illness:  The patient is referred by Dr. Littie Tommas for surgical evaluation and management of newly diagnosed primary hyperparathyroidism. Patient had been noted on routine laboratory studies to have an elevated serum calcium level. Recent laboratories show a calcium of 10.6. Ionized calcium was elevated at 5.7. Patient was sent for intact PTH level which was unsuppressed at 65. 24-hour urinary calcium was normal. Vitamin D  was slightly low at 29. Patient had undergone an ultrasound examination of the neck in April 2024. This showed a solitary spongiform nodule but no other abnormalities. Patient does have a history of hypothyroidism and is on levothyroxine. Patient underwent nuclear medicine parathyroid  scanning on April 15, 2023. This demonstrated focal activity at the inferior pole of the left thyroid  lobe consistent with parathyroid  adenoma. Patient is now referred for consideration for parathyroidectomy. Patient has been symptomatic. She notes significant fatigue. She has had bone and joint discomfort. Bone density scanning shows osteopenia. She has a history of nephrolithiasis. Patient gives a family history of thyroid  disease in her sisters. Neither of them have required surgery. Patient has had no prior head or neck surgery. She is the primary caretaker for her husband. She has a daughter who lives in [place] .  Review of Systems: A complete review of systems was obtained from the patient. I have reviewed this information and discussed as appropriate with the patient. See HPI as well for other ROS.  Review of Systems  Constitutional: Positive for malaise/fatigue.  HENT: Negative.  Eyes: Negative.  Respiratory: Negative.  Cardiovascular: Negative.  Gastrointestinal: Negative.  Genitourinary:  Positive for frequency.  Nephrolithiasis  Musculoskeletal: Positive for joint pain.  Skin: Negative.  Neurological: Negative.  Endo/Heme/Allergies: Negative.  Psychiatric/Behavioral: Negative.    Medical History: Past Medical History:  Diagnosis Date  Thyroid  disease   Patient Active Problem List  Diagnosis  Primary hyperparathyroidism (CMS/HHS-HCC)   Past Surgical History:  Procedure Laterality Date  HYSTERECTOMY  TONSILLECTOMY    Allergies  Allergen Reactions  Sulfa (Sulfonamide Antibiotics) Hives   Current Outpatient Medications on File Prior to Visit  Medication Sig Dispense Refill  aspirin 81 MG EC tablet Take 81 mg by mouth once daily  levothyroxine (SYNTHROID) 75 MCG tablet Take 75 mcg by mouth every morning before breakfast (0630)  loratadine (CLARITIN) 10 mg tablet Take by mouth at bedtime  sertraline  (ZOLOFT ) 50 MG tablet Take 50 mg by mouth at bedtime   No current facility-administered medications on file prior to visit.   Family History  Problem Relation Age of Onset  Deep vein thrombosis (DVT or abnormal blood clot formation) Mother  Breast cancer Mother  Coronary Artery Disease (Blocked arteries around heart) Father    Social History   Tobacco Use  Smoking Status Never  Smokeless Tobacco Never    Social History   Socioeconomic History  Marital status: Married  Tobacco Use  Smoking status: Never  Smokeless tobacco: Never  Substance and Sexual Activity  Alcohol use: Not Currently  Drug use: Never   Social Drivers of Health   Financial Resource Strain: Low Risk (09/11/2021)  Received from [facility]  Overall Financial Resource Strain (CARDIA)  Difficulty of Paying Living Expenses: Not hard at all  Food Insecurity: No Food Insecurity (11/10/2021)  Received from [facility]  Hunger Vital Sign  Worried About Running Out of Food in the  Last Year: Never true  Ran Out of Food in the Last Year: Never true  Transportation Needs: No  Transportation Needs (11/10/2021)  Received from [facility] - Transportation  Lack of Transportation (Medical): No  Lack of Transportation (Non-Medical): No  Physical Activity: Inactive (09/11/2021)  [facility]  Exercise Vital Sign  Days of Exercise per Week: 0 days  Minutes of Exercise per Session: 0 min  Stress: No Stress Concern Present (11/10/2021)  Received from [facility] - Occupational Stress Questionnaire  Feeling of Stress : Not at all  Social Connections: Moderately Integrated (09/11/2021)  Received from [facility]  Social Connection and Isolation Panel [NHANES]  Frequency of Communication with Friends and Family: More than three times a week  Frequency of Social Gatherings with Friends and Family: More than three times a week  Attends Religious Services: Never  Database Administrator or Organizations: Yes  Attends Engineer, Structural: More than 4 times per year  Marital Status: Married   Objective:   Vitals:  BP: 123/79  Pulse: 104  Temp: 36.7 C (98 F)  SpO2: 96%  Weight: 73.5 kg (162 lb)  Height: 157.5 cm (5' 2)   Body mass index is 29.63 kg/m.  Physical Exam   GENERAL APPEARANCE Comfortable, no acute issues Development: normal Gross deformities: none  SKIN Rash, lesions, ulcers: none Induration, erythema: none Nodules: none palpable  EYES Conjunctiva and lids: normal Pupils: equal  EARS, NOSE, MOUTH, THROAT External ears: no lesion or deformity External nose: no lesion or deformity Hearing: grossly normal  NECK Symmetric: yes Trachea: midline Thyroid : no palpable nodules in the thyroid  bed  CHEST/CV Not assessed  ABDOMEN Not assessed  GENITOURINARY/RECTAL Not assessed  MUSCULOSKELETAL Station and gait: normal Digits and nails: no clubbing or cyanosis Muscle strength: grossly normal all extremities Deformity: none  LYMPHATIC Cervical: none  palpable Supraclavicular: none palpable  PSYCHIATRIC Oriented to person, place, and time: yes Mood and affect: normal for situation Judgment and insight: appropriate for situation   Assessment and Plan:   Primary hyperparathyroidism (CMS/HHS-HCC)  Patient is referred by her endocrinologist for surgical evaluation and management of newly diagnosed primary hyperparathyroidism.  Patient provided with a copy of Parathyroid  Surgery: Treatment for Your Parathyroid  Gland Problem, published by [employer], 12 pages. Book reviewed and explained to patient during visit today.  They we reviewed her clinical history. We reviewed her recent laboratory studies. We reviewed the results of her imaging studies. I have recommended proceeding with minimally invasive outpatient parathyroid  surgery. I described the procedure. We discussed the size and location of the surgical incision. We discussed the postoperative recovery. We discussed potential complications including recurrent laryngeal nerve injury resulting in hoarseness. We discussed her return to normal activities and the probability of symptomatic improvement. The patient understands and wishes to proceed with surgery in the near future.   Krystal Spinner, MD [facility] Surgery [facility] practice Office: [phone]

## 2023-08-12 ENCOUNTER — Telehealth: Payer: Self-pay | Admitting: *Deleted

## 2023-08-12 NOTE — Telephone Encounter (Signed)
 Patient called asking when to stop her aspirin before her surgery on Thursday.  Per Dr Myna Hidalgo, ok to stop today and restart day after surgery.  LMAM.

## 2023-08-15 ENCOUNTER — Encounter: Payer: Self-pay | Admitting: Hematology & Oncology

## 2023-08-15 ENCOUNTER — Ambulatory Visit (HOSPITAL_COMMUNITY): Payer: Medicare Other | Admitting: Anesthesiology

## 2023-08-15 ENCOUNTER — Other Ambulatory Visit (HOSPITAL_COMMUNITY): Payer: Self-pay

## 2023-08-15 ENCOUNTER — Other Ambulatory Visit: Payer: Self-pay

## 2023-08-15 ENCOUNTER — Encounter (HOSPITAL_COMMUNITY)
Admission: RE | Disposition: A | Discharge status: Home / Self Care | Payer: Self-pay | Source: Ambulatory Visit | Attending: Surgery

## 2023-08-15 ENCOUNTER — Ambulatory Visit (HOSPITAL_BASED_OUTPATIENT_CLINIC_OR_DEPARTMENT_OTHER): Payer: Medicare Other | Admitting: Anesthesiology

## 2023-08-15 ENCOUNTER — Ambulatory Visit (HOSPITAL_COMMUNITY)
Admission: RE | Admit: 2023-08-15 | Discharge: 2023-08-15 | Disposition: A | Discharge status: Home / Self Care | Payer: Medicare Other | Source: Ambulatory Visit | Attending: Surgery | Admitting: Surgery

## 2023-08-15 ENCOUNTER — Encounter (HOSPITAL_COMMUNITY): Payer: Self-pay | Admitting: Surgery

## 2023-08-15 DIAGNOSIS — F419 Anxiety disorder, unspecified: Secondary | ICD-10-CM | POA: Insufficient documentation

## 2023-08-15 DIAGNOSIS — E210 Primary hyperparathyroidism: Secondary | ICD-10-CM | POA: Diagnosis not present

## 2023-08-15 DIAGNOSIS — M8580 Other specified disorders of bone density and structure, unspecified site: Secondary | ICD-10-CM | POA: Insufficient documentation

## 2023-08-15 DIAGNOSIS — I10 Essential (primary) hypertension: Secondary | ICD-10-CM | POA: Insufficient documentation

## 2023-08-15 DIAGNOSIS — D351 Benign neoplasm of parathyroid gland: Secondary | ICD-10-CM | POA: Insufficient documentation

## 2023-08-15 DIAGNOSIS — Z87442 Personal history of urinary calculi: Secondary | ICD-10-CM | POA: Insufficient documentation

## 2023-08-15 DIAGNOSIS — E039 Hypothyroidism, unspecified: Secondary | ICD-10-CM | POA: Diagnosis not present

## 2023-08-15 DIAGNOSIS — F32A Depression, unspecified: Secondary | ICD-10-CM | POA: Diagnosis not present

## 2023-08-15 DIAGNOSIS — J45909 Unspecified asthma, uncomplicated: Secondary | ICD-10-CM | POA: Insufficient documentation

## 2023-08-15 DIAGNOSIS — Z79890 Hormone replacement therapy (postmenopausal): Secondary | ICD-10-CM | POA: Diagnosis not present

## 2023-08-15 DIAGNOSIS — Z8349 Family history of other endocrine, nutritional and metabolic diseases: Secondary | ICD-10-CM | POA: Diagnosis not present

## 2023-08-15 HISTORY — PX: PARATHYROIDECTOMY: SHX19

## 2023-08-15 SURGERY — PARATHYROIDECTOMY
Anesthesia: General | Laterality: Left

## 2023-08-15 MED ORDER — FENTANYL CITRATE (PF) 250 MCG/5ML IJ SOLN
INTRAMUSCULAR | Status: AC
  Filled 2023-08-15: qty 5

## 2023-08-15 MED ORDER — CHLORHEXIDINE GLUCONATE 0.12 % MT SOLN: 15.0000 mL | Freq: Once | OROMUCOSAL | Status: DC

## 2023-08-15 MED ORDER — CHLORHEXIDINE GLUCONATE CLOTH 2 % EX PADS: 6.0000 | MEDICATED_PAD | Freq: Once | CUTANEOUS | Status: DC

## 2023-08-15 MED ORDER — DEXAMETHASONE SODIUM PHOSPHATE 10 MG/ML IJ SOLN
INTRAMUSCULAR | Status: DC | PRN
  Administered 2023-08-15: 5 mg via INTRAVENOUS

## 2023-08-15 MED ORDER — TRAMADOL HCL 50 MG PO TABS
50.0000 mg | ORAL_TABLET | Freq: Four times a day (QID) | ORAL | 0 refills | Status: DC | PRN
  Filled 2023-08-15: qty 12, 3d supply, fill #0

## 2023-08-15 MED ORDER — DEXAMETHASONE SODIUM PHOSPHATE 10 MG/ML IJ SOLN
INTRAMUSCULAR | Status: AC
  Filled 2023-08-15: qty 1

## 2023-08-15 MED ORDER — PROPOFOL 10 MG/ML IV BOLUS
INTRAVENOUS | Status: AC
  Filled 2023-08-15: qty 20

## 2023-08-15 MED ORDER — LIDOCAINE HCL (PF) 2 % IJ SOLN
INTRAMUSCULAR | Status: AC
Start: 2023-08-15 — End: ?
  Filled 2023-08-15: qty 5

## 2023-08-15 MED ORDER — BUPIVACAINE HCL (PF) 0.25 % IJ SOLN
INTRAMUSCULAR | Status: AC
  Filled 2023-08-15: qty 30

## 2023-08-15 MED ORDER — HEMOSTATIC AGENTS (NO CHARGE) OPTIME
TOPICAL | Status: DC | PRN
  Administered 2023-08-15: 1

## 2023-08-15 MED ORDER — PROPOFOL 10 MG/ML IV BOLUS
INTRAVENOUS | Status: DC | PRN
  Administered 2023-08-15: 100 mg via INTRAVENOUS
  Administered 2023-08-15: 60 mg via INTRAVENOUS

## 2023-08-15 MED ORDER — BUPIVACAINE HCL 0.25 % IJ SOLN
INTRAMUSCULAR | Status: DC | PRN
  Administered 2023-08-15: 10 mL

## 2023-08-15 MED ORDER — ONDANSETRON HCL 4 MG/2ML IJ SOLN
INTRAMUSCULAR | Status: AC
  Filled 2023-08-15: qty 2

## 2023-08-15 MED ORDER — ORAL CARE MOUTH RINSE: 15.0000 mL | Freq: Once | OROMUCOSAL | Status: DC

## 2023-08-15 MED ORDER — ONDANSETRON HCL 4 MG/2ML IJ SOLN
INTRAMUSCULAR | Status: DC | PRN
  Administered 2023-08-15: 4 mg via INTRAVENOUS

## 2023-08-15 MED ORDER — CIPROFLOXACIN IN D5W 400 MG/200ML IV SOLN
400.0000 mg | INTRAVENOUS | Status: AC
  Administered 2023-08-15: 400 mg via INTRAVENOUS
  Filled 2023-08-15: qty 200

## 2023-08-15 MED ORDER — ROCURONIUM BROMIDE 10 MG/ML (PF) SYRINGE
PREFILLED_SYRINGE | INTRAVENOUS | Status: DC | PRN
  Administered 2023-08-15: 40 mg via INTRAVENOUS

## 2023-08-15 MED ORDER — ONDANSETRON HCL 4 MG/2ML IJ SOLN: 4.0000 mg | Freq: Four times a day (QID) | INTRAMUSCULAR | Status: DC | PRN

## 2023-08-15 MED ORDER — LIDOCAINE 2% (20 MG/ML) 5 ML SYRINGE
INTRAMUSCULAR | Status: DC | PRN
  Administered 2023-08-15: 60 mg via INTRAVENOUS

## 2023-08-15 MED ORDER — FENTANYL CITRATE PF 50 MCG/ML IJ SOSY: 25.0000 ug | PREFILLED_SYRINGE | INTRAMUSCULAR | Status: DC | PRN

## 2023-08-15 MED ORDER — 0.9 % SODIUM CHLORIDE (POUR BTL) OPTIME
TOPICAL | Status: DC | PRN
  Administered 2023-08-15: 1000 mL

## 2023-08-15 MED ORDER — LACTATED RINGERS IV SOLN: INTRAVENOUS | Status: DC | PRN

## 2023-08-15 MED ORDER — ROCURONIUM BROMIDE 10 MG/ML (PF) SYRINGE
PREFILLED_SYRINGE | INTRAVENOUS | Status: AC
  Filled 2023-08-15: qty 10

## 2023-08-15 MED ORDER — FENTANYL CITRATE (PF) 250 MCG/5ML IJ SOLN
INTRAMUSCULAR | Status: DC | PRN
  Administered 2023-08-15 (×2): 100 ug via INTRAVENOUS
  Administered 2023-08-15: 50 ug via INTRAVENOUS

## 2023-08-15 MED ORDER — SUGAMMADEX SODIUM 200 MG/2ML IV SOLN
INTRAVENOUS | Status: DC | PRN
  Administered 2023-08-15: 200 mg via INTRAVENOUS

## 2023-08-15 MED ORDER — OXYCODONE HCL 5 MG PO TABS: 5.0000 mg | ORAL_TABLET | Freq: Once | ORAL | Status: DC | PRN

## 2023-08-15 MED ORDER — OXYCODONE HCL 5 MG/5ML PO SOLN: 5.0000 mg | Freq: Once | ORAL | Status: DC | PRN

## 2023-08-15 SURGICAL SUPPLY — 30 items
ATTRACTOMAT 16X20 MAGNETIC DRP (DRAPES) ×1 IMPLANT
BAG COUNTER SPONGE SURGICOUNT (BAG) ×1 IMPLANT
BLADE SURG 15 STRL LF DISP TIS (BLADE) ×1 IMPLANT
CHLORAPREP W/TINT 26 (MISCELLANEOUS) ×1 IMPLANT
CLIP TI MEDIUM 6 (CLIP) ×2 IMPLANT
CLIP TI WIDE RED SMALL 6 (CLIP) ×2 IMPLANT
COVER SURGICAL LIGHT HANDLE (MISCELLANEOUS) ×1 IMPLANT
DERMABOND ADVANCED .7 DNX12 (GAUZE/BANDAGES/DRESSINGS) ×1 IMPLANT
DRAPE LAPAROTOMY T 98X78 PEDS (DRAPES) ×1 IMPLANT
DRAPE UTILITY XL STRL (DRAPES) ×1 IMPLANT
ELECT REM PT RETURN 15FT ADLT (MISCELLANEOUS) ×1 IMPLANT
GAUZE 4X4 16PLY ~~LOC~~+RFID DBL (SPONGE) ×1 IMPLANT
GLOVE SURG ORTHO 8.0 STRL STRW (GLOVE) ×1 IMPLANT
GOWN STRL REUS W/ TWL XL LVL3 (GOWN DISPOSABLE) ×3 IMPLANT
HEMOSTAT SURGICEL 2X4 FIBR (HEMOSTASIS) ×1 IMPLANT
ILLUMINATOR WAVEGUIDE N/F (MISCELLANEOUS) IMPLANT
KIT BASIN OR (CUSTOM PROCEDURE TRAY) ×1 IMPLANT
KIT TURNOVER KIT A (KITS) IMPLANT
NDL HYPO 22X1.5 SAFETY MO (MISCELLANEOUS) ×1 IMPLANT
NEEDLE HYPO 22X1.5 SAFETY MO (MISCELLANEOUS) ×1
PACK BASIC VI WITH GOWN DISP (CUSTOM PROCEDURE TRAY) ×1 IMPLANT
PENCIL SMOKE EVACUATOR (MISCELLANEOUS) ×1 IMPLANT
SHEARS HARMONIC 9CM CVD (BLADE) IMPLANT
SUT MNCRL AB 4-0 PS2 18 (SUTURE) ×1 IMPLANT
SUT SILK 2-0 30XBRD TIE 12 (SUTURE) IMPLANT
SUT VIC AB 3-0 SH 18 (SUTURE) ×1 IMPLANT
SYR BULB IRRIG 60ML STRL (SYRINGE) ×1 IMPLANT
SYR CONTROL 10ML LL (SYRINGE) ×1 IMPLANT
TOWEL OR 17X26 10 PK STRL BLUE (TOWEL DISPOSABLE) ×1 IMPLANT
TUBING CONNECTING 10 (TUBING) ×1 IMPLANT

## 2023-08-15 NOTE — Anesthesia Procedure Notes (Signed)
Procedure Name: Intubation Date/Time: 08/15/2023 7:31 AM  Performed by: Elyn Peers, CRNAPre-anesthesia Checklist: Patient identified, Emergency Drugs available, Suction available, Patient being monitored and Timeout performed Patient Re-evaluated:Patient Re-evaluated prior to induction Oxygen Delivery Method: Circle system utilized Preoxygenation: Pre-oxygenation with 100% oxygen Induction Type: IV induction Ventilation: Mask ventilation without difficulty Laryngoscope Size: Miller and 3 Grade View: Grade I Tube type: Oral Tube size: 7.0 mm Number of attempts: 1 Airway Equipment and Method: Stylet Placement Confirmation: ETT inserted through vocal cords under direct vision, positive ETCO2 and breath sounds checked- equal and bilateral Secured at: 21 cm Tube secured with: Tape Dental Injury: Teeth and Oropharynx as per pre-operative assessment

## 2023-08-15 NOTE — Interval H&P Note (Signed)
History and Physical Interval Note:  08/15/2023 6:58 AM  Heather Brown  has presented today for surgery, with the diagnosis of PRIMARY HYPERPARATHYROIDISM.  The various methods of treatment have been discussed with the patient and family. After consideration of risks, benefits and other options for treatment, the patient has consented to    Procedure(s): LEFT INFERIOR PARATHYROIDECTOMY (Left) as a surgical intervention.    The patient's history has been reviewed, patient examined, no change in status, stable for surgery.  I have reviewed the patient's chart and labs.  Questions were answered to the patient's satisfaction.    Darnell Level, MD [facility] practice Office: [phone]   Darnell Level

## 2023-08-15 NOTE — Discharge Instructions (Addendum)

## 2023-08-15 NOTE — Op Note (Signed)
OPERATIVE REPORT - PARATHYROIDECTOMY  Preoperative diagnosis: Primary hyperparathyroidism  Postop diagnosis: Same  Procedure: Left inferior minimally invasive parathyroidectomy  Surgeon:  Darnell Level, MD  Anesthesia: General endotracheal  Estimated blood loss: Minimal  Preparation: ChloraPrep  Indications: The patient is referred by Dr. Dorisann Frames for surgical evaluation and management of newly diagnosed primary hyperparathyroidism. Patient had been noted on routine laboratory studies to have an elevated serum calcium level. Recent laboratories show a calcium of 10.6. Ionized calcium was elevated at 5.7. Patient was sent for intact PTH level which was unsuppressed at 65. 24-hour urinary calcium was normal. Vitamin D was slightly low at 29. Patient had undergone an ultrasound examination of the neck in April 2024. This showed a solitary spongiform nodule but no other abnormalities. Patient does have a history of hypothyroidism and is on levothyroxine. Patient underwent nuclear medicine parathyroid scanning on April 15, 2023. This demonstrated focal activity at the inferior pole of the left thyroid lobe consistent with parathyroid adenoma. Patient is now referred for consideration for parathyroidectomy. Patient has been symptomatic. She notes significant fatigue. She has had bone and joint discomfort. Bone density scanning shows osteopenia. She has a history of nephrolithiasis. Patient gives a family history of thyroid disease in her sisters. Neither of them have required surgery. Patient has had no prior head or neck surgery. She is the primary caretaker for her husband. She has a daughter who lives in [place].   Procedure: The patient was prepared in the pre-operative holding area. The patient was brought to the operating room and placed in a supine position on the operating room table. Following administration of general anesthesia, the patient was positioned and then  prepped and draped in the usual strict aseptic fashion. After ascertaining that an adequate level of anesthesia been achieved, a neck incision was made with a #15 blade. Dissection was carried through subcutaneous tissues and platysma. Hemostasis was obtained with the electrocautery. Skin flaps were developed circumferentially and a Weitlander retractor was placed for exposure.  Strap muscles were incised in the midline. Strap muscles were reflected laterally exposing the thyroid lobe. With gentle blunt dissection the thyroid lobe was mobilized.  Dissection was carried posteriorly and an enlarged parathyroid gland was identified. It was gently mobilized. Vascular structures were divided between small ligaclips. Care was taken to avoid the recurrent laryngeal nerve. The parathyroid gland was completely excised. It was submitted to pathology where frozen section confirmed hypercellular parathyroid tissue consistent with adenoma.  Neck was irrigated with warm saline and good hemostasis was noted. Fibrillar was placed in the operative field. Strap muscles were approximated in the midline with interrupted 3-0 Vicryl sutures. Platysma was closed with interrupted 3-0 Vicryl sutures. Marcaine was infiltrated circumferentially. Skin was closed with a running 4-0 Monocryl subcuticular suture. Wound was washed and dried and Dermabond was applied. Patient was awakened from anesthesia and brought to the recovery room. The patient tolerated the procedure well.   Darnell Level, MD [facility] Surgery Office: [phone]

## 2023-08-15 NOTE — Transfer of Care (Signed)
Immediate Anesthesia Transfer of Care Note  Patient: Heather Brown  Procedure(s) Performed: LEFT INFERIOR PARATHYROIDECTOMY (Left)  Patient Location: PACU  Anesthesia Type:General  Level of Consciousness: awake, alert , and patient cooperative  Airway & Oxygen Therapy: Patient Spontanous Breathing and Patient connected to face mask oxygen  Post-op Assessment: Report given to RN and Post -op Vital signs reviewed and stable  Post vital signs: Reviewed and stable  Last Vitals:  Vitals Value Taken Time  BP 169/122 08/15/23 0843  Temp    Pulse 111 08/15/23 0844  Resp 16 08/15/23 0844  SpO2 98 % 08/15/23 0844  Vitals shown include unfiled device data.  Last Pain:  Vitals:   08/15/23 0556  TempSrc: Oral  PainSc: 0-No pain         Complications: No notable events documented.

## 2023-08-15 NOTE — Anesthesia Postprocedure Evaluation (Signed)
Anesthesia Post Note  Patient: Heather Brown  Procedure(s) Performed: LEFT INFERIOR PARATHYROIDECTOMY (Left)     Patient location during evaluation: PACU Anesthesia Type: General Level of consciousness: awake and alert Pain management: pain level controlled Vital Signs Assessment: post-procedure vital signs reviewed and stable Respiratory status: spontaneous breathing, nonlabored ventilation, respiratory function stable and patient connected to nasal cannula oxygen Cardiovascular status: blood pressure returned to baseline and stable Postop Assessment: no apparent nausea or vomiting Anesthetic complications: no   No notable events documented.  Last Vitals:  Vitals:   08/15/23 0931 08/15/23 0945  BP: (!) 186/92 (!) 162/100  Pulse: 86 86  Resp: 12   Temp: (!) 36.4 C   SpO2: 93% 92%    Last Pain:  Vitals:   08/15/23 0945  TempSrc:   PainSc: 0-No pain                 Antoria Lanza S

## 2023-08-15 NOTE — Anesthesia Preprocedure Evaluation (Signed)
Anesthesia Evaluation  Patient identified by MRN, date of birth, ID band Patient awake    Reviewed: Allergy & Precautions, H&P , NPO status , Patient's Chart, lab work & pertinent test results  History of Anesthesia Complications (+) PONV and history of anesthetic complications  Airway Mallampati: II   Neck ROM: full    Dental   Pulmonary asthma    breath sounds clear to auscultation       Cardiovascular hypertension,  Rhythm:regular Rate:Normal     Neuro/Psych  PSYCHIATRIC DISORDERS Anxiety Depression       GI/Hepatic ,GERD  ,,  Endo/Other  Hypothyroidism    Renal/GU      Musculoskeletal  (+) Arthritis ,    Abdominal   Peds  Hematology  (+) Blood dyscrasia, anemia   Anesthesia Other Findings   Reproductive/Obstetrics                             Anesthesia Physical Anesthesia Plan  ASA: 2  Anesthesia Plan: General   Post-op Pain Management:    Induction: Intravenous  PONV Risk Score and Plan: 4 or greater and Ondansetron, Dexamethasone, Scopolamine patch - Pre-op and Treatment may vary due to age or medical condition  Airway Management Planned: Oral ETT  Additional Equipment:   Intra-op Plan:   Post-operative Plan: Extubation in OR  Informed Consent: I have reviewed the patients History and Physical, chart, labs and discussed the procedure including the risks, benefits and alternatives for the proposed anesthesia with the patient or authorized representative who has indicated his/her understanding and acceptance.     Dental advisory given  Plan Discussed with: CRNA, Anesthesiologist and Surgeon  Anesthesia Plan Comments:        Anesthesia Quick Evaluation

## 2023-08-16 ENCOUNTER — Encounter (HOSPITAL_COMMUNITY): Payer: Self-pay | Admitting: Surgery

## 2023-08-16 LAB — SURGICAL PATHOLOGY

## 2023-09-04 DIAGNOSIS — Z9089 Acquired absence of other organs: Secondary | ICD-10-CM | POA: Diagnosis not present

## 2023-09-04 DIAGNOSIS — Z9889 Other specified postprocedural states: Secondary | ICD-10-CM | POA: Diagnosis not present

## 2023-09-04 DIAGNOSIS — E210 Primary hyperparathyroidism: Secondary | ICD-10-CM | POA: Diagnosis not present

## 2023-09-10 DIAGNOSIS — Z9089 Acquired absence of other organs: Secondary | ICD-10-CM | POA: Insufficient documentation

## 2023-09-11 DIAGNOSIS — K080 Exfoliation of teeth due to systemic causes: Secondary | ICD-10-CM | POA: Diagnosis not present

## 2023-10-03 DIAGNOSIS — H00022 Hordeolum internum right lower eyelid: Secondary | ICD-10-CM | POA: Diagnosis not present

## 2023-10-03 DIAGNOSIS — H00025 Hordeolum internum left lower eyelid: Secondary | ICD-10-CM | POA: Diagnosis not present

## 2023-10-08 NOTE — Progress Notes (Unsigned)
   Subjective:    Patient ID: Heather Brown, female    DOB: 1946/02/04, 78 y.o.   MRN: [id number]  HPI She is here for earwax removal.  She is having difficulty with wax buildup on the left ear and not the right.   Review of Systems     Objective:    Physical Exam  Right tympanic membrane and canals normal.  Left tympanic membrane and canal normal after cerumen was lavaged.      Assessment & Plan:  Impacted cerumen of right ear Return here as needed.

## 2023-10-09 ENCOUNTER — Ambulatory Visit: Admitting: Family Medicine

## 2023-10-09 VITALS — BP 122/78 | HR 79 | Wt 164.4 lb

## 2023-10-09 DIAGNOSIS — H6121 Impacted cerumen, right ear: Secondary | ICD-10-CM | POA: Diagnosis not present

## 2023-11-05 ENCOUNTER — Ambulatory Visit (INDEPENDENT_AMBULATORY_CARE_PROVIDER_SITE_OTHER): Admitting: Nurse Practitioner

## 2023-11-05 ENCOUNTER — Encounter: Payer: Self-pay | Admitting: Nurse Practitioner

## 2023-11-05 VITALS — BP 128/82 | HR 90 | Wt 166.8 lb

## 2023-11-05 DIAGNOSIS — E7800 Pure hypercholesterolemia, unspecified: Secondary | ICD-10-CM

## 2023-11-05 DIAGNOSIS — I10 Essential (primary) hypertension: Secondary | ICD-10-CM | POA: Diagnosis not present

## 2023-11-05 DIAGNOSIS — M25541 Pain in joints of right hand: Secondary | ICD-10-CM | POA: Diagnosis not present

## 2023-11-05 DIAGNOSIS — Z9089 Acquired absence of other organs: Secondary | ICD-10-CM

## 2023-11-05 DIAGNOSIS — D45 Polycythemia vera: Secondary | ICD-10-CM | POA: Diagnosis not present

## 2023-11-05 DIAGNOSIS — K2100 Gastro-esophageal reflux disease with esophagitis, without bleeding: Secondary | ICD-10-CM | POA: Insufficient documentation

## 2023-11-05 DIAGNOSIS — E210 Primary hyperparathyroidism: Secondary | ICD-10-CM

## 2023-11-05 DIAGNOSIS — K862 Cyst of pancreas: Secondary | ICD-10-CM

## 2023-11-05 DIAGNOSIS — L821 Other seborrheic keratosis: Secondary | ICD-10-CM | POA: Insufficient documentation

## 2023-11-05 DIAGNOSIS — Z9889 Other specified postprocedural states: Secondary | ICD-10-CM | POA: Diagnosis not present

## 2023-11-05 DIAGNOSIS — M79641 Pain in right hand: Secondary | ICD-10-CM | POA: Insufficient documentation

## 2023-11-05 HISTORY — DX: Pain in right hand: M79.641

## 2023-11-05 NOTE — Assessment & Plan Note (Signed)
 Cystic lesions on the pancreas, including a unilocular cystic lesion on the head and a small cystic lesion on the tail, are benign and not a cause for concern

## 2023-11-05 NOTE — Assessment & Plan Note (Signed)
 Recent surgery with improvement of symptoms. Will follow with endocrine for repeat evaluation of labs. Discussed that calcium levels should return to normal following the surgery and we may need to consider restart of oral calcium supplement for bone health.

## 2023-11-05 NOTE — Assessment & Plan Note (Signed)
 Hypertension is well-controlled without valsartan. Advised to monitor blood pressure and report if it consistently exceeds 140/90 mmHg. - Monitor blood pressure regularly - Contact provider if blood pressure consistently exceeds 140/90 mmHg

## 2023-11-05 NOTE — Assessment & Plan Note (Signed)
 Seborrheic keratosis in the axilla, benign and common with aging. Can be removed for cosmetic reasons if desired. - Provide information on seborrheic keratosis and potential removal options

## 2023-11-05 NOTE — Assessment & Plan Note (Signed)
 Underwent parathyroidectomy to remove an enlarged parathyroid gland, fifteen times larger than normal, to correct hypercalcemia. Reports improved symptoms, including reduced brain fog. - Follow up with endocrine for post-surgical evaluation and calcium level assessment

## 2023-11-05 NOTE — Assessment & Plan Note (Signed)
 Polycythemia managed without iron supplements due to intolerance. Hemoglobin levels are well-managed. Continue with hematology recommendations and monitoring.

## 2023-11-05 NOTE — Assessment & Plan Note (Signed)
 LDL at 109 mg/dL and HDL at 42 mg/dL as of May 5284. LDL slightly above target of 100 mg/dL, but overall cholesterol ratio of 2.6 indicates less than half the average risk of complications

## 2023-11-05 NOTE — Patient Instructions (Signed)
 There are several over-the-counter (OTC) creams for dark spots (hyperpigmentation) that can help lighten and even out skin tone. Here are some of the most effective ingredients and top product picks:  ?? Key Ingredients to Look For These ingredients are proven to fade dark spots and brighten skin:  Ingredient   What It Does Hydroquinone (2%) Fades dark spots by inhibiting melanin production (available OTC in 2%) Niacinamide  Brightens skin, reduces inflammation, evens out tone Vitamin C   Antioxidant that helps fade spots and protect skin Alpha Arbutin  Gentler alternative to hydroquinone, reduces pigmentation Kojic Acid   Natural skin lightener, often used in combo products Retinol   Speeds cell turnover, helps fade spots over time Azelaic Acid  Fights acne and fades spots -- great for sensitive skin Licorice Extract  Natural brightener and anti-inflammatory   ?? Top OTC Creams for Dark Spots Here are some popular, dermatologist-recommended options:  1. Ambi Fade Cream (With 2% Hydroquinone) Contains hydroquinone, vitamin E, and alpha hydroxy acids  Good for dark spots from acne, aging, or sun  Use only on spots, and apply sunscreen during the day  2. The Ordinary Alpha Arbutin 2% + HA Affordable and gentle  Combats discoloration with minimal irritation  3. Murad Rapid Dark Spot Correcting Serum Contains glycolic acid and tranexamic acid  Brightens skin and smooths texture  4. Paula's Choice Discoloration Repair Serum Features niacinamide and tranexamic acid  Fragrance-free and good for sensitive skin  5. Differin Dark Spot Correcting Serum Contains 2% hydroquinone and sea buckthorn  Works well on acne-related dark spots  6. CeraVe Resurfacing Retinol Serum Combines retinol with niacinamide and ceramides  Fades post-acne marks while supporting skin barrier  ?? Important Tips: Always wear SPF 30+ daily -- these ingredients can make your skin more sun-sensitive and  unprotected sun exposure can worsen dark spots.  Be patient: Fading dark spots can take 4-12 weeks of consistent use.  Patch test before using a new product to avoid irritation.

## 2023-11-05 NOTE — Progress Notes (Signed)
 Shawna Clamp, DNP, AGNP-c [facility]  [address] [place], [place] [address] [phone]  ESTABLISHED PATIENT- Chronic Health and/or Follow-Up Visit  Blood pressure 128/82, pulse 90, weight 166 lb 12.8 oz (75.7 kg), last menstrual period 07/30/1980.    Heather Brown is a 78 y.o. year old female presenting today for evaluation and management of chronic conditions.   History of Present Illness Heather Brown is a 78 year old female who presents for follow-up after thyroid surgery.  She recently underwent thyroid surgery, during which a significantly enlarged parathyroid gland, fifteen times larger than normal, was removed. Post-surgery, she is applying lanolin with vitamin E to the surgical site and is scheduled for further tests next month to assess her condition.  She has not been taking her vitamins, including fish oil, since January. She inquires about the necessity of resuming these supplements, particularly vitamin D3, which she used to take at a dose of 2000 IU. She dislikes taking fish oil due to the size of the pill.  She has a history of polycythemia but does not take iron supplements due to adverse reactions in the past. Her hemoglobin levels are stable.  She reports a history of joint issues, including a missing ACL in one leg and a previous shoulder fracture. She has used a supplement with olive extract and collagen for joint support but has discontinued it due to cost. She notes increased joint discomfort since stopping the supplement.  She is concerned about her cholesterol levels but recalls that her last check showed a good ratio of good to bad cholesterol. She has stopped taking her valsartan and blood pressures are stable.   She has a seborrheic keratosis in her armpit, which has grown since her last dermatology appointment. She is concerned about its appearance but understands it is not harmful.  All ROS negative with exception of  what is listed above.   PHYSICAL EXAM Physical Exam   PLAN Problem List Items Addressed This Visit     Polycythemia vera (HCC) (Chronic)   Polycythemia managed without iron supplements due to intolerance. Hemoglobin levels are well-managed. Continue with hematology recommendations and monitoring.       Elevated LDL cholesterol level   LDL at 109 mg/dL and HDL at 42 mg/dL as of May 9147. LDL slightly above target of 100 mg/dL, but overall cholesterol ratio of 2.6 indicates less than half the average risk of complications      Primary hypertension   Hypertension is well-controlled without valsartan. Advised to monitor blood pressure and report if it consistently exceeds 140/90 mmHg. - Monitor blood pressure regularly - Contact provider if blood pressure consistently exceeds 140/90 mmHg       Pancreatic cyst   Cystic lesions on the pancreas, including a unilocular cystic lesion on the head and a small cystic lesion on the tail, are benign and not a cause for concern      Hyperparathyroidism, primary (HCC)   Recent surgery with improvement of symptoms. Will follow with endocrine for repeat evaluation of labs. Discussed that calcium levels should return to normal following the surgery and we may need to consider restart of oral calcium supplement for bone health.       Status post parathyroidectomy   Underwent parathyroidectomy to remove an enlarged parathyroid gland, fifteen times larger than normal, to correct hypercalcemia. Reports improved symptoms, including reduced brain fog. - Follow up with endocrine for post-surgical evaluation and calcium level assessment      Pain in  thumb joint with movement of right hand - Primary   Experiencing joint pain in the shoulder and hand, possibly related to previous fractures and ACL deficiency. Considering collagen supplements for joint support. Discussed potential benefits of collagen and hyaluronic acid for joint health and skin  elasticity. - Refer to Dr. Benjiman Core, orthopedist, for evaluation of hand pain and potential treatment options - Consider collagen supplements for joint health       Relevant Orders   Ambulatory referral to Sports Medicine   Seborrheic keratosis   Seborrheic keratosis in the axilla, benign and common with aging. Can be removed for cosmetic reasons if desired. - Provide information on seborrheic keratosis and potential removal options       No follow-ups on file. Time: , >50% spent counseling, care coordination, chart review, and documentation.   Shawna Clamp, DNP, AGNP-c

## 2023-11-05 NOTE — Assessment & Plan Note (Signed)
 Experiencing joint pain in the shoulder and hand, possibly related to previous fractures and ACL deficiency. Considering collagen supplements for joint support. Discussed potential benefits of collagen and hyaluronic acid for joint health and skin elasticity. - Refer to Dr. Benjiman Core, orthopedist, for evaluation of hand pain and potential treatment options - Consider collagen supplements for joint health

## 2023-11-08 ENCOUNTER — Ambulatory Visit: Admitting: Family Medicine

## 2023-11-08 ENCOUNTER — Other Ambulatory Visit: Payer: Self-pay

## 2023-11-08 VITALS — BP 120/76 | Ht 62.0 in | Wt 163.0 lb

## 2023-11-08 DIAGNOSIS — M79641 Pain in right hand: Secondary | ICD-10-CM | POA: Diagnosis not present

## 2023-11-08 NOTE — Progress Notes (Signed)
 Subjective:   HPI: Patient is a 78 y.o. female presenting with progressively worsening right thumb pain over the past several months. She describes the pain as intermittent, achy, and sore, with a sensation similar to "hitting a funny bone." The discomfort is most pronounced during activities requiring grip strength, such as opening jars, using a can opener, or holding a hose. She denies any numbness, tingling, or radiation of pain to the wrist. She has not used any treatments or medications for the pain. There is no history of hand surgery.   Past Medical History:  Diagnosis Date   ACE-inhibitor cough    Acute cystitis with hematuria 07/11/2022   Anemia    Anxiety    Arthritis    COVID-19 06/26/2022   Depression    Dyspareunia    dryness with intercourse   Endometriosis 1982   found at [facility]   Fluid level behind tympanic membrane of both ears 03/02/2021   GERD (gastroesophageal reflux disease)    Heart murmur    History of kidney stones    History of left shoulder fracture 2017   Hypertension    Hypothyroidism    Impacted cerumen of both ears 10/03/2022   Left ACL tear    Osteopenia 2009   Pneumonia    Polycythemia vera (HCC) 12/02/2017   PONV (postoperative nausea and vomiting)    Rhinorrhea 10/09/2021   Right hand pain 11/05/2023   Subacute cough 09/11/2021   Thyroid nodule    followed by Dr Talmage Nap   Urinary incontinence    with coughing/sneezing    Current Outpatient Medications on File Prior to Visit  Medication Sig Dispense Refill   aspirin 81 MG tablet Take 81 mg by mouth at bedtime.     azelastine (ASTELIN) 0.1 % nasal spray Place 1 spray into both nostrils 2 (two) times daily as needed for rhinitis. Use in each nostril as directed     Cholecalciferol (VITAMIN D3) 50 MCG (2000 UT) TABS Take 2,000 Units by mouth daily.     fluticasone (FLONASE) 50 MCG/ACT nasal spray Place 1 spray into both nostrils daily as needed for allergies.     levothyroxine (SYNTHROID) 75  MCG tablet Take 75 mcg by mouth daily.     loratadine (CLARITIN) 10 MG tablet Take 10 mg by mouth at bedtime.     pantoprazole (PROTONIX) 40 MG tablet Take 1 tablet (40 mg total) by mouth daily. 90 tablet 1   sertraline (ZOLOFT) 50 MG tablet Take 1 tablet (50 mg total) by mouth at bedtime. 90 tablet 3   tretinoin (RETIN-A) 0.05 % cream Apply 1 Application topically at bedtime as needed. Apply pea sized amount to affected area of face nightly as needed.  5   valsartan (DIOVAN) 160 MG tablet Take 1 tablet (160 mg total) by mouth daily. (Patient not taking: Reported on 07/03/2023) 90 tablet 3   No current facility-administered medications on file prior to visit.    Past Surgical History:  Procedure Laterality Date   ABDOMINAL HYSTERECTOMY  07/30/1980   TAH--for abnormal paps/endometriosis   AUGMENTATION MAMMAPLASTY  07/31/1975   BREAST IMPLANT EXCHANGE  10/2020   COLONOSCOPY WITH PROPOFOL N/A 10/29/2013   Procedure: COLONOSCOPY WITH PROPOFOL;  Surgeon: Petra Kuba, MD;  Location: [facility]COPY;  Service: Endoscopy;  Laterality: N/A;   COLONOSCOPY WITH PROPOFOL N/A 12/22/2014   Procedure: COLONOSCOPY WITH PROPOFOL;  Surgeon: Vida Rigger, MD;  Location: [facility] ENDOSCOPY;  Service: Endoscopy;  Laterality: N/A;  ultra slim scope  COSMETIC SURGERY  11/28/2011   face lift,neck lift, eye lift--Dr. Benna Dunks   dilatation and curettage  07/30/1980   HOT HEMOSTASIS N/A 12/22/2014   Procedure: HOT HEMOSTASIS (ARGON PLASMA COAGULATION/BICAP);  Surgeon: Vida Rigger, MD;  Location: [facility] ENDOSCOPY;  Service: Endoscopy;  Laterality: N/A;   PARATHYROIDECTOMY Left 08/15/2023   Procedure: LEFT INFERIOR PARATHYROIDECTOMY;  Surgeon: Darnell Level, MD;  Location: [facility] ORS;  Service: General;  Laterality: Left;   TONSILLECTOMY      Allergies  Allergen Reactions   Sulfa Antibiotics Hives   Cephalexin Rash    Face turned bright red.   Diazepam Other (See Comments)    Doesn't like the way it makes her feel.    BP 120/76    Ht 5\' 2"  (1.575 m)   Wt 163 lb (73.9 kg)   LMP 07/30/1980   BMI 29.81 kg/m       No data to display              No data to display              Objective:  Physical Exam:  Gen: NAD, comfortable in exam room MSK:  Right hand: No swelling or erythema noted. Point tenderness is present at the [facility] joint and thenar eminence. Grip strength is 4/5. Pain is reproduced with hand grip and thumb abduction.  Neuro: sensation and motor function intact   Ultrasound of right hand:  Findings reveal calcification and arthritic changes at the [facility] joint.   Assessment & Plan:  Right thumb [facility] joint arthritis, evidenced by localized tenderness, decreased grip strength, pain with thumb abduction, and ultrasound findings of arthritic changes and calcification.  Plan: - Start topical Voltaren gel to affected area up to 3-4 times a day   - Apply heat to the thumb joint as needed for symptom relief - Follow up in 6 weeks for reassessment or earlier if symptoms worsen  Attestation:  I agree with medical student and personally examined the patient.  Patient likely dealing with [facility] joint arthritis.  Ultrasound findings do show some calcification of the [facility] joint as well as some hypoechoic changes consistent with an effusion.  At this time, given the patient has not had any intervention, we will go ahead and do topical Voltaren as well as compression gloves which she may wear while she is doing any sort of activity.  Patient can follow-up in approximately 4 to 6 weeks.  At that time, if no improvement can consider steroid injection.  Brenton Grills MD

## 2023-11-08 NOTE — Patient Instructions (Signed)
 Voltaren gel Compress gloves Follow up in 6 weeks

## 2023-11-17 ENCOUNTER — Other Ambulatory Visit: Payer: Self-pay | Admitting: Nurse Practitioner

## 2023-11-17 DIAGNOSIS — K219 Gastro-esophageal reflux disease without esophagitis: Secondary | ICD-10-CM

## 2023-11-19 ENCOUNTER — Emergency Department (HOSPITAL_BASED_OUTPATIENT_CLINIC_OR_DEPARTMENT_OTHER)
Admission: EM | Admit: 2023-11-19 | Discharge: 2023-11-19 | Disposition: A | Discharge status: Home / Self Care | Attending: Emergency Medicine | Admitting: Emergency Medicine

## 2023-11-19 ENCOUNTER — Other Ambulatory Visit: Payer: Self-pay

## 2023-11-19 ENCOUNTER — Encounter (HOSPITAL_BASED_OUTPATIENT_CLINIC_OR_DEPARTMENT_OTHER): Payer: Self-pay | Admitting: Emergency Medicine

## 2023-11-19 ENCOUNTER — Emergency Department (HOSPITAL_BASED_OUTPATIENT_CLINIC_OR_DEPARTMENT_OTHER)

## 2023-11-19 DIAGNOSIS — W1789XA Other fall from one level to another, initial encounter: Secondary | ICD-10-CM | POA: Diagnosis not present

## 2023-11-19 DIAGNOSIS — M79642 Pain in left hand: Secondary | ICD-10-CM | POA: Diagnosis not present

## 2023-11-19 DIAGNOSIS — S52501A Unspecified fracture of the lower end of right radius, initial encounter for closed fracture: Secondary | ICD-10-CM | POA: Insufficient documentation

## 2023-11-19 DIAGNOSIS — S52592A Other fractures of lower end of left radius, initial encounter for closed fracture: Secondary | ICD-10-CM | POA: Diagnosis not present

## 2023-11-19 DIAGNOSIS — S6991XA Unspecified injury of right wrist, hand and finger(s), initial encounter: Secondary | ICD-10-CM | POA: Diagnosis not present

## 2023-11-19 DIAGNOSIS — Z7982 Long term (current) use of aspirin: Secondary | ICD-10-CM | POA: Diagnosis not present

## 2023-11-19 MED ORDER — LIDOCAINE HCL 2 % IJ SOLN
5.0000 mL | Freq: Once | INTRAMUSCULAR | Status: AC
  Administered 2023-11-19: 100 mg
  Filled 2023-11-19: qty 20

## 2023-11-19 MED ORDER — OXYCODONE-ACETAMINOPHEN 5-325 MG PO TABS: 1.0000 | ORAL_TABLET | Freq: Three times a day (TID) | ORAL | 0 refills | Status: AC | PRN

## 2023-11-19 MED ORDER — OXYCODONE-ACETAMINOPHEN 5-325 MG PO TABS
1.0000 | ORAL_TABLET | Freq: Once | ORAL | Status: AC
  Administered 2023-11-19: 1 via ORAL
  Filled 2023-11-19: qty 1
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
## 2023-11-19 NOTE — ED Notes (Signed)
 Pt requested a twist to drink. Pt given the same

## 2023-11-19 NOTE — Discharge Instructions (Signed)
 Today you were seen after a fall.  You have a distal radial fracture and possibly a scaphoid fracture.  Please follow-up with Dr. Adrain Alar with orthopedics for further evaluation and treatment.  You may take Tylenol  and Motrin  as needed for pain.  Thank you for letting us  treat you today. After reviewing your imaging, I feel you are safe to go home. Please follow up with your PCP in the next several days and provide them with your records from this visit. Return to the Emergency Room if pain becomes severe or symptoms worsen.
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
## 2023-11-20 DIAGNOSIS — S52501A Unspecified fracture of the lower end of right radius, initial encounter for closed fracture: Secondary | ICD-10-CM | POA: Diagnosis not present

## 2023-11-21 DIAGNOSIS — E039 Hypothyroidism, unspecified: Secondary | ICD-10-CM | POA: Diagnosis not present

## 2023-11-26 DIAGNOSIS — S52501D Unspecified fracture of the lower end of right radius, subsequent encounter for closed fracture with routine healing: Secondary | ICD-10-CM | POA: Diagnosis not present

## 2023-11-28 DIAGNOSIS — E210 Primary hyperparathyroidism: Secondary | ICD-10-CM | POA: Diagnosis not present

## 2023-11-28 DIAGNOSIS — E041 Nontoxic single thyroid nodule: Secondary | ICD-10-CM | POA: Diagnosis not present

## 2023-11-28 DIAGNOSIS — M810 Age-related osteoporosis without current pathological fracture: Secondary | ICD-10-CM | POA: Diagnosis not present

## 2023-11-28 DIAGNOSIS — D45 Polycythemia vera: Secondary | ICD-10-CM | POA: Diagnosis not present

## 2023-11-28 DIAGNOSIS — E039 Hypothyroidism, unspecified: Secondary | ICD-10-CM | POA: Diagnosis not present

## 2023-12-03 DIAGNOSIS — S52501D Unspecified fracture of the lower end of right radius, subsequent encounter for closed fracture with routine healing: Secondary | ICD-10-CM | POA: Diagnosis not present

## 2023-12-17 ENCOUNTER — Ambulatory Visit (INDEPENDENT_AMBULATORY_CARE_PROVIDER_SITE_OTHER)

## 2023-12-17 DIAGNOSIS — Z0000 Encounter for general adult medical examination without abnormal findings: Secondary | ICD-10-CM

## 2023-12-17 DIAGNOSIS — S52501D Unspecified fracture of the lower end of right radius, subsequent encounter for closed fracture with routine healing: Secondary | ICD-10-CM | POA: Diagnosis not present

## 2023-12-17 NOTE — Progress Notes (Signed)
 Subjective:   Heather Brown is a 78 y.o. who presents for a Medicare Wellness preventive visit.  As a reminder, Annual Wellness Visits don't include a physical exam, and some assessments may be limited, especially if this visit is performed virtually. We may recommend an in-person follow-up visit with your provider if needed.  Visit Complete: Virtual I connected with  Heather Brown on 12/17/23 by a audio enabled telemedicine application and verified that I am speaking with the correct person using two identifiers.  Patient Location: Home  Provider Location: Office/Clinic  I discussed the limitations of evaluation and management by telemedicine. The patient expressed understanding and agreed to proceed.  Vital Signs: Because this visit was a virtual/telehealth visit, some criteria may be missing or patient reported. Any vitals not documented were not able to be obtained and vitals that have been documented are patient reported.  VideoError- Librarian, academic were attempted between this provider and patient, however failed, due to patient having technical difficulties OR patient did not have access to video capability.  We continued and completed visit with audio only.   Persons Participating in Visit: Patient.  AWV Questionnaire: No: Patient Medicare AWV questionnaire was not completed prior to this visit.  Cardiac Risk Factors include: advanced age (>83men, >56 women);hypertension     Objective:     Today's Vitals   There is no height or weight on file to calculate BMI.     12/17/2023    2:54 PM 11/19/2023    3:18 PM 08/09/2023    9:47 AM 07/03/2023   10:42 AM 01/04/2023   12:09 PM 12/17/2022   11:32 AM 07/05/2022   12:19 PM  Advanced Directives  Does Patient Have a Medical Advance Directive? Yes No Yes Yes Yes Yes Yes  Type of Estate agent of [place];Living will  Healthcare Power of [place];Living will  Healthcare Power of [place];Living will Healthcare Power of [place];Living will Healthcare Power of [place];Living will Healthcare Power of [place];Living will  Does patient want to make changes to medical advance directive?      Yes (ED - Information included in AVS) No - Patient declined  Copy of Healthcare Power of Attorney in Chart? No - copy requested  No - copy requested No - copy requested No - copy requested No - copy requested No - copy requested    Current Medications (verified) Outpatient Encounter Medications as of 12/17/2023  Medication Sig   aspirin 81 MG tablet Take 81 mg by mouth at bedtime.   azelastine  (ASTELIN ) 0.1 % nasal spray Place 1 spray into both nostrils 2 (two) times daily as needed for rhinitis. Use in each nostril as directed   Cholecalciferol (VITAMIN D3) 50 MCG (2000 UT) TABS Take 2,000 Units by mouth daily.   fluticasone  (FLONASE ) 50 MCG/ACT nasal spray Place 1 spray into both nostrils daily as needed for allergies.   levothyroxine (SYNTHROID) 75 MCG tablet Take 75 mcg by mouth daily.   loratadine (CLARITIN) 10 MG tablet Take 10 mg by mouth at bedtime.   pantoprazole  (PROTONIX ) 40 MG tablet TAKE 1 TABLET(40 MG) BY MOUTH DAILY   sertraline  (ZOLOFT ) 50 MG tablet Take 1 tablet (50 mg total) by mouth at bedtime.   tretinoin (RETIN-A) 0.05 % cream Apply 1 Application topically at bedtime as needed. Apply pea sized amount to affected area of face nightly as needed.   valsartan  (DIOVAN ) 160 MG tablet Take 1 tablet (160 mg total) by mouth daily. (Patient not  taking: Reported on 07/03/2023)   No facility-administered encounter medications on file as of 12/17/2023.    Allergies (verified) Sulfa antibiotics, Cephalexin , and Diazepam   History: Past Medical History:  Diagnosis Date   ACE-inhibitor cough    Acute cystitis with hematuria 07/11/2022   Anemia    Anxiety    Arthritis    COVID-19 06/26/2022   Depression    Dyspareunia    dryness with intercourse    Endometriosis 1982   found at [facility]   Fluid level behind tympanic membrane of both ears 03/02/2021   GERD (gastroesophageal reflux disease)    Heart murmur    History of kidney stones    History of left shoulder fracture 2017   Hypertension    Hypothyroidism    Impacted cerumen of both ears 10/03/2022   Left ACL tear    Osteopenia 2009   Pneumonia    Polycythemia vera (HCC) 12/02/2017   PONV (postoperative nausea and vomiting)    Rhinorrhea 10/09/2021   Right hand pain 11/05/2023   Subacute cough 09/11/2021   Thyroid  nodule    followed by Dr Ronelle Coffee   Urinary incontinence    with coughing/sneezing   Past Surgical History:  Procedure Laterality Date   ABDOMINAL HYSTERECTOMY  07/30/1980   [facility]--for abnormal paps/endometriosis   AUGMENTATION MAMMAPLASTY  07/31/1975   BREAST IMPLANT EXCHANGE  10/2020   COLONOSCOPY WITH PROPOFOL  N/A 10/29/2013   Procedure: COLONOSCOPY WITH PROPOFOL ;  Surgeon: Mathew Solomon, MD;  Location: [facility];  Service: Endoscopy;  Laterality: N/A;   COLONOSCOPY WITH PROPOFOL  N/A 12/22/2014   Procedure: COLONOSCOPY WITH PROPOFOL ;  Surgeon: Ozell Blunt, MD;  Location: [facility] ENDOSCOPY;  Service: Endoscopy;  Laterality: N/A;  ultra slim scope   COSMETIC SURGERY  11/28/2011   face lift,neck lift, eye lift--Dr. Francina Irish   dilatation and curettage  07/30/1980   HOT HEMOSTASIS N/A 12/22/2014   Procedure: HOT HEMOSTASIS (ARGON PLASMA COAGULATION/BICAP);  Surgeon: Ozell Blunt, MD;  Location: [facility] ENDOSCOPY;  Service: Endoscopy;  Laterality: N/A;   PARATHYROIDECTOMY Left 08/15/2023   Procedure: LEFT INFERIOR PARATHYROIDECTOMY;  Surgeon: Oralee Billow, MD;  Location: [facility] ORS;  Service: General;  Laterality: Left;   TONSILLECTOMY     Family History  Problem Relation Age of Onset   Cancer Mother    Cervical cancer Mother    Diabetes Father        adult onset   Stroke Father    Thyroid  disease Sister        hyperthyroid   Asthma Sister    Thyroid  disease Sister         hypothyroid   Cancer Maternal Grandmother    Stomach cancer Maternal Grandmother    Social History   Socioeconomic History   Marital status: Married    Spouse name: Not on file   Number of children: 2   Years of education: Not on file   Highest education level: Not on file  Occupational History   Occupation: retired  Tobacco Use   Smoking status: Never   Smokeless tobacco: Never  Vaping Use   Vaping status: Never Used  Substance and Sexual Activity   Alcohol use: Not Currently    Alcohol/week: 1.0 standard drink of alcohol    Types: 1 Glasses of wine per week   Drug use: No   Sexual activity: Yes    Partners: Male    Birth control/protection: Surgical    Comment: [facility]  Other Topics Concern   Not on file  Social  History Narrative   Not on file   Social Drivers of Health   Financial Resource Strain: Low Risk  (12/17/2023)   Overall Financial Resource Strain (CARDIA)    Difficulty of Paying Living Expenses: Not hard at all  Food Insecurity: No Food Insecurity (12/17/2023)   Hunger Vital Sign    Worried About Running Out of Food in the Last Year: Never true    Ran Out of Food in the Last Year: Never true  Transportation Needs: No Transportation Needs (12/17/2023)   PRAPARE - Administrator, Civil Service (Medical): No    Lack of Transportation (Non-Medical): No  Physical Activity: Inactive (12/17/2023)   Exercise Vital Sign    Days of Exercise per Week: 0 days    Minutes of Exercise per Session: 0 min  Stress: No Stress Concern Present (12/17/2023)   [employer] of Occupational Health - Occupational Stress Questionnaire    Feeling of Stress : Not at all  Social Connections: Moderately Isolated (12/17/2023)   Social Connection and Isolation Panel [NHANES]    Frequency of Communication with Friends and Family: More than three times a week    Frequency of Social Gatherings with Friends and Family: Twice a week    Attends Religious Services: Never    Automotive engineer or Organizations: No    Attends Engineer, structural: Never    Marital Status: Married    Tobacco Counseling Counseling given: Not Answered    Clinical Intake:  Pre-visit preparation completed: Yes  Pain : No/denies pain     Nutritional Risks: None Diabetes: No  Lab Results  Component Value Date   HGBA1C 6.2 (H) 12/17/2022     How often do you need to have someone help you when you read instructions, pamphlets, or other written materials from your doctor or pharmacy?: 1 - Never  Interpreter Needed?: No  Information entered by :: NAllen LPN   Activities of Daily Living     12/17/2023    2:46 PM 08/09/2023    9:51 AM  In your present state of health, do you have any difficulty performing the following activities:  Hearing? 0   Vision? 0   Difficulty concentrating or making decisions? 0   Walking or climbing stairs? 0   Dressing or bathing? 0   Doing errands, shopping? 0 0  Preparing Food and eating ? N   Using the Toilet? N   In the past six months, have you accidently leaked urine? Y   Comment at night sometimes   Do you have problems with loss of bowel control? N   Managing your Medications? N   Managing your Finances? N   Housekeeping or managing your Housekeeping? N     Patient Care Team: Early, Adriane Albe, NP as PCP - General (Nurse Practitioner) Pa, Lasandra Points [facility] Tasia Farr, MD as Referring Physician (Endocrinology) Oralee Billow, MD as Consulting Physician (General Surgery) Ivor Mars, MD as Consulting Physician (Oncology)  Indicate any recent Medical Services you may have received from other than [facility] providers in the past year (date may be approximate).     Assessment:    This is a routine wellness examination for [place].  Hearing/Vision screen Hearing Screening - Comments:: Denies hearing issues Vision Screening - Comments:: Regular eye exams, [facility]   Goals Addressed             This  Visit's Progress    Patient Stated  12/17/2023, get arm healed up       Depression Screen     12/17/2023    2:59 PM 11/05/2023   11:54 AM 03/27/2023    1:13 PM 02/14/2023    9:05 AM 12/17/2022   11:31 AM 11/10/2021    1:07 PM 11/08/2021    9:49 AM  PHQ 2/9 Scores  PHQ - 2 Score 0 0 0 0 0 0 0  PHQ- 9 Score 0        Exception Documentation       Medical reason    Fall Risk     12/17/2023    2:58 PM 11/05/2023   11:54 AM 03/27/2023    1:13 PM 02/14/2023    9:05 AM 12/17/2022   11:31 AM  Fall Risk   Falls in the past year? 1 0 0 0 0  Comment fell getting out of sons car      Number falls in past yr: 0 0 0 0 0  Injury with Fall? 1 0 0 0 0  Comment broke wrist      Risk for fall due to : Medication side effect No Fall Risks No Fall Risks  No Fall Risks  Follow up Falls prevention discussed;Falls evaluation completed Falls evaluation completed Falls evaluation completed  Falls evaluation completed    MEDICARE RISK AT HOME:  Medicare Risk at Home Any stairs in or around the home?: No If so, are there any without handrails?: No Home free of loose throw rugs in walkways, pet beds, electrical cords, etc?: Yes Adequate lighting in your home to reduce risk of falls?: Yes Life alert?: No Use of a cane, walker or w/c?: No Grab bars in the bathroom?: Yes Shower chair or bench in shower?: Yes Elevated toilet seat or a handicapped toilet?: Yes  TIMED UP AND GO:  Was the test performed?  No  Cognitive Function: 6CIT completed        12/17/2023    3:00 PM 12/17/2022   11:57 AM 11/10/2021    1:13 PM 09/11/2021    2:52 PM  6CIT Screen  What Year? 0 points 0 points 0 points 0 points  What month? 0 points 0 points 0 points 0 points  What time? 0 points 0 points 0 points 0 points  Count back from 20 0 points 0 points 0 points 0 points  Months in reverse 0 points 0 points 0 points 0 points  Repeat phrase 2 points 0 points 0 points 0 points  Total Score 2 points 0 points 0 points 0  points    Immunizations Immunization History  Administered Date(s) Administered   DT (Pediatric) 01/29/2003   Influenza, High Dose Seasonal PF 07/17/2016   Influenza,inj,Quad PF,6+ Mos 05/17/2017   Influenza,inj,quad, With Preservative 05/25/2015, 05/16/2018   Influenza-Unspecified 04/29/2021   PFIZER(Purple Top)SARS-COV-2 Vaccination 09/06/2019, 10/01/2019, 05/30/2020   Pneumococcal Polysaccharide-23 04/04/2001   Tdap 03/11/2013    Screening Tests Health Maintenance  Topic Date Due   Zoster Vaccines- Shingrix (1 of 2) Never done   Pneumonia Vaccine 62+ Years old (2 of 2 - PCV) 04/04/2002   DTaP/Tdap/Td (3 - Td or Tdap) 03/12/2023   COVID-19 Vaccine (4 - 2024-25 season) 03/31/2023   INFLUENZA VACCINE  02/28/2024   Medicare Annual Wellness (AWV)  12/16/2024   DEXA SCAN  Completed   HPV VACCINES  Aged Out   Meningococcal B Vaccine  Aged Out   Colonoscopy  Discontinued   Hepatitis C Screening  Discontinued  Health Maintenance  Health Maintenance Due  Topic Date Due   Zoster Vaccines- Shingrix (1 of 2) Never done   Pneumonia Vaccine 25+ Years old (2 of 2 - PCV) 04/04/2002   DTaP/Tdap/Td (3 - Td or Tdap) 03/12/2023   COVID-19 Vaccine (4 - 2024-25 season) 03/31/2023   Health Maintenance Items Addressed: Declines vaccines.  Additional Screening:  Vision Screening: Recommended annual ophthalmology exams for early detection of glaucoma and other disorders of the eye.  Dental Screening: Recommended annual dental exams for proper oral hygiene  Community Resource Referral / Chronic Care Management: CRR required this visit?  No   CCM required this visit?  No   Plan:    I have personally reviewed and noted the following in the patient's chart:   Medical and social history Use of alcohol, tobacco or illicit drugs  Current medications and supplements including opioid prescriptions. Patient is not currently taking opioid prescriptions. Functional ability and  status Nutritional status Physical activity Advanced directives List of other physicians Hospitalizations, surgeries, and ER visits in previous 12 months Vitals Screenings to include cognitive, depression, and falls Referrals and appointments  In addition, I have reviewed and discussed with patient certain preventive protocols, quality metrics, and best practice recommendations. A written personalized care plan for preventive services as well as general preventive health recommendations were provided to patient.   Areatha Beecham, LPN   1/61/0960   After Visit Summary: (MyChart) Due to this being a telephonic visit, the after visit summary with patients personalized plan was offered to patient via MyChart   Notes: Nothing significant to report at this time.

## 2023-12-17 NOTE — Patient Instructions (Signed)
 Heather Brown , Thank you for taking time out of your busy schedule to complete your Annual Wellness Visit with me. I enjoyed our conversation and look forward to speaking with you again next year. I, as well as your care team,  appreciate your ongoing commitment to your health goals. Please review the following plan we discussed and let me know if I can assist you in the future. Your Game plan/ To Do List    Referrals: If you haven't heard from the office you've been referred to, please reach out to them at the phone provided.  N/a Follow up Visits: Next Medicare AWV with our clinical staff: 12/22/2024 at 2:50   Have you seen your provider in the last 6 months (3 months if uncontrolled diabetes)? Yes Next Office Visit with your provider: 02/27/2024 at 11:15  Clinician Recommendations:  Aim for 30 minutes of exercise or brisk walking, 6-8 glasses of water, and 5 servings of fruits and vegetables each day.       This is a list of the screening recommended for you and due dates:  Health Maintenance  Topic Date Due   Zoster (Shingles) Vaccine (1 of 2) Never done   Pneumonia Vaccine (2 of 2 - PCV) 04/04/2002   DTaP/Tdap/Td vaccine (3 - Td or Tdap) 03/12/2023   COVID-19 Vaccine (4 - 2024-25 season) 03/31/2023   Flu Shot  02/28/2024   Medicare Annual Wellness Visit  12/16/2024   DEXA scan (bone density measurement)  Completed   HPV Vaccine  Aged Out   Meningitis B Vaccine  Aged Out   Colon Cancer Screening  Discontinued   Hepatitis C Screening  Discontinued    Advanced directives: (Copy Requested) Please bring a copy of your health care power of attorney and living will to the office to be added to your chart at your convenience. You can mail to [facility] [address] [place], [place] [address] or email to [email or web address] Advance Care Planning is important because it:  [x]  Makes sure you receive the medical care that is consistent with your values, goals, and  preferences  [x]  It provides guidance to your family and loved ones and reduces their decisional burden about whether or not they are making the right decisions based on your wishes.  Follow the link provided in your after visit summary or read over the paperwork we have mailed to you to help you started getting your Advance Directives in place. If you need assistance in completing these, please reach out to us  so that we can help you!  See attachments for Preventive Care and Fall Prevention Tips.

## 2024-01-01 ENCOUNTER — Ambulatory Visit: Payer: Medicare Other | Admitting: Hematology & Oncology

## 2024-01-01 ENCOUNTER — Telehealth: Payer: Self-pay | Admitting: *Deleted

## 2024-01-01 ENCOUNTER — Ambulatory Visit: Payer: Self-pay | Admitting: Hematology & Oncology

## 2024-01-01 ENCOUNTER — Encounter: Payer: Self-pay | Admitting: Hematology & Oncology

## 2024-01-01 ENCOUNTER — Other Ambulatory Visit: Payer: Self-pay

## 2024-01-01 ENCOUNTER — Inpatient Hospital Stay: Payer: Medicare Other | Admitting: Hematology & Oncology

## 2024-01-01 ENCOUNTER — Inpatient Hospital Stay: Payer: Medicare Other | Attending: Hematology & Oncology

## 2024-01-01 ENCOUNTER — Other Ambulatory Visit: Payer: Medicare Other

## 2024-01-01 VITALS — BP 120/73 | HR 80 | Temp 98.1°F | Resp 18 | Ht 62.0 in | Wt 163.0 lb

## 2024-01-01 DIAGNOSIS — D45 Polycythemia vera: Secondary | ICD-10-CM | POA: Insufficient documentation

## 2024-01-01 DIAGNOSIS — E611 Iron deficiency: Secondary | ICD-10-CM | POA: Insufficient documentation

## 2024-01-01 DIAGNOSIS — Z7982 Long term (current) use of aspirin: Secondary | ICD-10-CM | POA: Diagnosis not present

## 2024-01-01 DIAGNOSIS — K862 Cyst of pancreas: Secondary | ICD-10-CM

## 2024-01-01 DIAGNOSIS — Z79899 Other long term (current) drug therapy: Secondary | ICD-10-CM | POA: Insufficient documentation

## 2024-01-01 DIAGNOSIS — Z9089 Acquired absence of other organs: Secondary | ICD-10-CM | POA: Diagnosis not present

## 2024-01-01 DIAGNOSIS — Z9889 Other specified postprocedural states: Secondary | ICD-10-CM | POA: Diagnosis not present

## 2024-01-01 LAB — CBC WITH DIFFERENTIAL (CANCER CENTER ONLY)
Abs Immature Granulocytes: 0.01 10*3/uL (ref 0.00–0.07)
Basophils Absolute: 0.1 10*3/uL (ref 0.0–0.1)
Basophils Relative: 1 %
Eosinophils Absolute: 0.2 10*3/uL (ref 0.0–0.5)
Eosinophils Relative: 4 %
HCT: 37.9 % (ref 36.0–46.0)
Hemoglobin: 11.1 g/dL — ABNORMAL LOW (ref 12.0–15.0)
Immature Granulocytes: 0 %
Lymphocytes Relative: 28 %
Lymphs Abs: 1.3 10*3/uL (ref 0.7–4.0)
MCH: 21.8 pg — ABNORMAL LOW (ref 26.0–34.0)
MCHC: 29.3 g/dL — ABNORMAL LOW (ref 30.0–36.0)
MCV: 74.5 fL — ABNORMAL LOW (ref 80.0–100.0)
Monocytes Absolute: 0.6 10*3/uL (ref 0.1–1.0)
Monocytes Relative: 13 %
Neutro Abs: 2.4 10*3/uL (ref 1.7–7.7)
Neutrophils Relative %: 54 %
Platelet Count: 309 10*3/uL (ref 150–400)
RBC: 5.09 MIL/uL (ref 3.87–5.11)
RDW: 17.1 % — ABNORMAL HIGH (ref 11.5–15.5)
WBC Count: 4.4 10*3/uL (ref 4.0–10.5)
nRBC: 0 % (ref 0.0–0.2)

## 2024-01-01 LAB — CMP (CANCER CENTER ONLY)
ALT: 15 U/L (ref 0–44)
AST: 18 U/L (ref 15–41)
Albumin: 4.5 g/dL (ref 3.5–5.0)
Alkaline Phosphatase: 125 U/L (ref 38–126)
Anion gap: 8 (ref 5–15)
BUN: 17 mg/dL (ref 8–23)
CO2: 26 mmol/L (ref 22–32)
Calcium: 8.7 mg/dL — ABNORMAL LOW (ref 8.9–10.3)
Chloride: 106 mmol/L (ref 98–111)
Creatinine: 0.7 mg/dL (ref 0.44–1.00)
GFR, Estimated: >60 mL/min (ref >60)
Glucose, Bld: 89 mg/dL (ref 70–99)
Potassium: 3.8 mmol/L (ref 3.5–5.1)
Sodium: 140 mmol/L (ref 135–145)
Total Bilirubin: 0.9 mg/dL (ref 0.0–1.2)
Total Protein: 7 g/dL (ref 6.5–8.1)

## 2024-01-01 LAB — IRON AND IRON BINDING CAPACITY (CC-WL,HP ONLY)
Iron: 41 ug/dL (ref 28–170)
Saturation Ratios: 9 % — ABNORMAL LOW (ref 10.4–31.8)
TIBC: 473 ug/dL — ABNORMAL HIGH (ref 250–450)
UIBC: 432 ug/dL (ref 148–442)

## 2024-01-01 LAB — FERRITIN: Ferritin: 11 ng/mL (ref 11–307)

## 2024-01-01 LAB — LACTATE DEHYDROGENASE: LDH: 180 U/L (ref 98–192)

## 2024-01-01 NOTE — Progress Notes (Signed)
 Hematology and Oncology Follow Up Visit  Heather Brown [id number] Feb 20, 1946 78 y.o. 01/01/2024   Principle Diagnosis:  Polycythemia vera -- DNMT3A (+),  Epo = 5.7  Current Therapy:   Phlebotomy to maintain hematocrit below 45% Aspirin 81 mg p.o. daily     Interim History:  Heather Brown is back for follow-up.  She had her parathyroid  surgery.  This was in January.  She did well with this.  Unfortunately, I think about a month or so ago, she fell and fractured her right wrist.  She is in a splint right now.  Hopefully, the splint will be able to come off.  Thankfully, she did not need surgery.  She also has had issues with a cyst in the pancreas.  I think Gastroenterology has been following this.  She has had no obvious bleeding.  There is been no bruising.  She has had no headache.  She has had no cough or shortness of breath.  She is iron deficient.  When we last saw her, the ferritin was 7 with iron saturation of 10%.  Her hemoglobin has been trending downward.  As such, we may have to think about giving her some IV iron if we see that her blood count worsens.  She continues on baby aspirin.  She has had no problems with baby aspirin.  Currently, I would say that her performance status is probably ECOG 1.     Medications:  Current Outpatient Medications:    aspirin 81 MG tablet, Take 81 mg by mouth at bedtime., Disp: , Rfl:    azelastine  (ASTELIN ) 0.1 % nasal spray, Place 1 spray into both nostrils 2 (two) times daily as needed for rhinitis. Use in each nostril as directed, Disp: , Rfl:    Cholecalciferol (VITAMIN D3) 50 MCG (2000 UT) TABS, Take 2,000 Units by mouth daily., Disp: , Rfl:    fluticasone  (FLONASE ) 50 MCG/ACT nasal spray, Place 1 spray into both nostrils daily as needed for allergies., Disp: , Rfl:    levothyroxine (SYNTHROID) 75 MCG tablet, Take 75 mcg by mouth daily., Disp: , Rfl:    loratadine (CLARITIN) 10 MG tablet, Take 10 mg by mouth at bedtime., Disp:  , Rfl:    pantoprazole  (PROTONIX ) 40 MG tablet, TAKE 1 TABLET(40 MG) BY MOUTH DAILY, Disp: 90 tablet, Rfl: 1   sertraline  (ZOLOFT ) 50 MG tablet, Take 1 tablet (50 mg total) by mouth at bedtime., Disp: 90 tablet, Rfl: 3   tretinoin (RETIN-A) 0.05 % cream, Apply 1 Application topically at bedtime as needed. Apply pea sized amount to affected area of face nightly as needed., Disp: , Rfl: 5  Allergies:  Allergies  Allergen Reactions   Sulfa Antibiotics Hives   Cephalexin  Rash    Face turned bright red.   Diazepam Other (See Comments)    Doesn't like the way it makes her feel.    Past Medical History, Surgical history, Social history, and Family History were reviewed and updated.  Review of Systems: Review of Systems  HENT:  Negative.    Eyes: Negative.   Respiratory:  Positive for shortness of breath.   Cardiovascular: Negative.   Gastrointestinal:  Positive for abdominal distention.  Endocrine: Negative.   Musculoskeletal:  Positive for arthralgias, flank pain, myalgias and neck stiffness.  Skin: Negative.   Neurological: Negative.   Hematological: Negative.   Psychiatric/Behavioral: Negative.      Physical Exam:  Vital signs are temperature 98.1.  Pulse 80.  Blood pressure 120/73.  Weight  is 163 pounds.  Wt Readings from Last 3 Encounters:  11/19/23 162 lb 14.7 oz (73.9 kg)  11/08/23 163 lb (73.9 kg)  11/05/23 166 lb 12.8 oz (75.7 kg)    Physical Exam Vitals reviewed.  HENT:     Head: Normocephalic and atraumatic.  Eyes:     Pupils: Pupils are equal, round, and reactive to light.  Cardiovascular:     Rate and Rhythm: Normal rate and regular rhythm.     Heart sounds: Normal heart sounds.  Pulmonary:     Effort: Pulmonary effort is normal.     Breath sounds: Normal breath sounds.  Abdominal:     General: Bowel sounds are normal.     Palpations: Abdomen is soft.  Musculoskeletal:        General: No tenderness or deformity. Normal range of motion.     Cervical  back: Normal range of motion.     Comments: She has a splint on the right forearm.  There is no swelling.  She has some limited range of motion of the thumb.  Lymphadenopathy:     Cervical: No cervical adenopathy.  Skin:    General: Skin is warm and dry.     Findings: No erythema or rash.  Neurological:     Mental Status: She is alert and oriented to person, place, and time.  Psychiatric:        Behavior: Behavior normal.        Thought Content: Thought content normal.        Judgment: Judgment normal.      Lab Results  Component Value Date   WBC 4.8 08/09/2023   HGB 12.1 08/09/2023   HCT 40.8 08/09/2023   MCV 78.0 (L) 08/09/2023   PLT 278 08/09/2023     Chemistry      Component Value Date/Time   NA 136 08/09/2023 1004   NA 140 12/17/2022 1220   K 4.3 08/09/2023 1004   CL 108 08/09/2023 1004   CO2 24 08/09/2023 1004   BUN 23 08/09/2023 1004   BUN 13 12/17/2022 1220   CREATININE 0.67 08/09/2023 1004   CREATININE 0.76 07/03/2023 1010   CREATININE 0.79 03/10/2014 0836      Component Value Date/Time   CALCIUM 9.3 08/09/2023 1004   ALKPHOS 123 07/03/2023 1010   AST 19 07/03/2023 1010   ALT 21 07/03/2023 1010   BILITOT 0.9 07/03/2023 1010       Impression and Plan: Heather Brown is a 78 year old white female.  She has polycythemia vera.  So far, she is done very nicely.  Her iron levels are low so we really have not had to phlebotomize her.  She is taking baby aspirin which I think is a very good idea.  I had that she had to have the fall and then broke her right wrist.  Thankfully she did not need surgery.  I know that Orthopedic Surgery is following this closely.  Again I had to believe that she is quite iron deficient..  When we see her back, we will see what her iron studies look like..  I told her that she can take vitamin C to try to help her body absorb iron.  She cannot take oral iron.  If push comes to shove, we can always give her IV iron if we see that her  hemoglobin drops.  We will try to get her through the Summer.    Ivor Mars, MD 6/4/202512:13 PM

## 2024-01-01 NOTE — Telephone Encounter (Signed)
 Dr Cherryl Corona-  Shall I order MR abdomen/MRCP on this patient or do you want just MR abdomen (follow up on unilocular cystic lesion in the head of the pancreas)

## 2024-01-01 NOTE — Telephone Encounter (Signed)
 Patient has been scheduled for MRI abdomen at [facility] on Monday, 02/03/24 at 10 am, 945 am arrival. NPO 4 hours prior.   I have left a voicemail for patient to call back.

## 2024-01-01 NOTE — Telephone Encounter (Signed)
-----   Message from Nurse Dede Fanny sent at 12/30/2023  8:32 AM EDT ----- Regarding: FW: Repeat MRI in 6 months  ----- Message ----- From: Glennette Lanius, RN Sent: 12/30/2023  12:00 AM EDT To: Glennette Lanius, RN Subject: FW: Repeat MRI in 6 months                     Needs repeat mri abdomen around 01/21/24.... ----- Message ----- From: Elois Hair, MD Sent: 04/04/2023   1:09 PM EDT To: Glennette Lanius, RN Subject: RE: Repeat MRI in 6 months                     That MRI will suffice, thanks.  Can you put in a reminder for a repeat MRI in 1 year? ----- Message ----- From: Glennette Lanius, RN Sent: 04/04/2023  12:12 PM EDT To: Elois Hair, MD Subject: FW: Repeat MRI in 6 months                     Dr C- There is a reminder in the system to complete MRCP on patient in July for pancreatic cyst monitor. It looks like patient had MR abdomen 01/21/23 as ordered by Archibald Beard NP. Will this test suffice or do you need MRCP specifically? ----- Message ----- From: Heather Litter, RN Sent: 04/03/2023  12:00 AM EDT To: Heather Litter, RN Subject: FW: Repeat MRI in 6 months                      ----- Message ----- From: Elois Hair, MD Sent: 09/28/2022  12:17 PM EST To: Heather Litter, RN Subject: Repeat MRI in 6 months                         Stana Ear, can you place a reminder for this patient to have a repeat MRCP to monitor a pancreatic cyst in July?  Thanks

## 2024-01-02 ENCOUNTER — Encounter: Payer: Self-pay | Admitting: *Deleted

## 2024-01-02 DIAGNOSIS — M810 Age-related osteoporosis without current pathological fracture: Secondary | ICD-10-CM | POA: Diagnosis not present

## 2024-01-02 NOTE — Telephone Encounter (Signed)
 Spoke to patient; she requests reschedule to different date. Patient rescheduled to 02/12/24 at 10 am, 930 am arrival at [facility]. Patient has been advised of this updated information on time/date/location/prep for her MRI and she verbalizes understanding.

## 2024-01-09 DIAGNOSIS — Z4889 Encounter for other specified surgical aftercare: Secondary | ICD-10-CM | POA: Diagnosis not present

## 2024-01-09 DIAGNOSIS — Z9089 Acquired absence of other organs: Secondary | ICD-10-CM | POA: Diagnosis not present

## 2024-02-03 ENCOUNTER — Other Ambulatory Visit (HOSPITAL_COMMUNITY)

## 2024-02-12 ENCOUNTER — Ambulatory Visit (HOSPITAL_COMMUNITY)
Admission: RE | Admit: 2024-02-12 | Discharge: 2024-02-12 | Disposition: A | Discharge status: Home / Self Care | Source: Ambulatory Visit | Attending: Gastroenterology | Admitting: Gastroenterology

## 2024-02-12 ENCOUNTER — Other Ambulatory Visit: Payer: Self-pay | Admitting: Gastroenterology

## 2024-02-12 DIAGNOSIS — K7689 Other specified diseases of liver: Secondary | ICD-10-CM | POA: Diagnosis not present

## 2024-02-12 DIAGNOSIS — K862 Cyst of pancreas: Secondary | ICD-10-CM | POA: Insufficient documentation

## 2024-02-12 DIAGNOSIS — R590 Localized enlarged lymph nodes: Secondary | ICD-10-CM | POA: Diagnosis not present

## 2024-02-12 MED ORDER — GADOBUTROL 1 MMOL/ML IV SOLN
7.5000 mL | Freq: Once | INTRAVENOUS | Status: AC | PRN
  Administered 2024-02-12: 7.5 mL via INTRAVENOUS

## 2024-02-14 ENCOUNTER — Other Ambulatory Visit: Payer: Self-pay | Admitting: Nurse Practitioner

## 2024-02-14 DIAGNOSIS — K219 Gastro-esophageal reflux disease without esophagitis: Secondary | ICD-10-CM

## 2024-02-18 ENCOUNTER — Other Ambulatory Visit: Payer: Self-pay

## 2024-02-18 ENCOUNTER — Ambulatory Visit: Payer: Self-pay | Admitting: Gastroenterology

## 2024-02-18 DIAGNOSIS — K862 Cyst of pancreas: Secondary | ICD-10-CM

## 2024-02-18 NOTE — Progress Notes (Signed)
 Ms. Priego,  The large pancreatic cyst has been stable in size, as well as the other multiple smaller cysts.  This is good news. A repeat MRI is recommended in 6 months to ensure stability.  We will also repeat a blood test called CA19-9 at that time  Team,  Please place reminder for repeat MRI/MRCP in 6 months and a CA 19-9 for pancreatic cyst surveillance.

## 2024-02-26 NOTE — Progress Notes (Unsigned)
 Pneumonia: T-dap: Shingrix:   Catheline Doing, DNP, AGNP-c [facility] [address] [place], [place] [address] Main Office [phone]  BP 120/74   Pulse 68   Ht 5' 1.75 (1.568 m)   Wt 166 lb 6.4 oz (75.5 kg)   LMP 07/30/1980   BMI 30.68 kg/m    Subjective:    Patient ID: Heather Brown, female    DOB: 10/06/1945, 78 y.o.   MRN: [id number]  HPI: History of Present Illness Heather Brown is a 78 year old female who presents for her annual exam.   She has ongoing issues with her arm following an injury on April 22nd, where she fell and landed on cement, causing significant pain in her hands. She feels her right arm appears 'crooked' from the fracture as she did not undergo surgery. She performs daily exercises to maintain mobility, although she experiences stiffness every morning. She did not require physical therapy but uses a ball for exercises. She still experiences weakness and difficulty lifting heavy objects. Overall she is pleased with her progress and healing.   She has a history of thyroid  and parathyroid  issues, with nodules on her pancreas. She underwent surgery for her thyroid  and parathyroid .  She is currently on pantoprazole  for heartburn, which has improved her symptoms significantly. She reports low calcium and iron levels, for which she has started taking Citracal Plus D3 and vitamin D3 supplements. We discussed that pantoprazole  can reduce absorption of the minerals and we will watch this on her labs.   She experiences fatigue, particularly in the afternoons, and has difficulty tolerating oral iron supplements due to nausea. She does see hematology routinely.   She has polycythemia vera, diagnosed by her hematologist, with noted low iron levels. She frequently craves and eats crushed ice.   She also receives bone infusions every six months for her bone health, which sometimes causes arm and leg pain.  She has concerns about her  skin, particularly on her arms, which she feels have aged significantly over the past year. She uses lotion regularly but is intentional to wear long sleeves to avoid showing her arms.   She has been experiencing tearing in her left eyes, sometimes with crusting upon waking with some burning and itching. She chronically uses preservative-free eye drops.   No chest pain or shortness of breath. No changes in bowel or bladder habits, although she wears a pad at night.   Pertinent items are noted in HPI.    Most Recent Depression Screen:     02/27/2024   11:47 AM 12/17/2023    2:59 PM 11/05/2023   11:54 AM 03/27/2023    1:13 PM 02/14/2023    9:05 AM  Depression screen PHQ 2/9  Decreased Interest 0 0 0 0 0  Down, Depressed, Hopeless 0 0 0 0 0  PHQ - 2 Score 0 0 0 0 0  Altered sleeping  0     Tired, decreased energy  0     Change in appetite  0     Feeling bad or failure about yourself   0     Trouble concentrating  0     Moving slowly or fidgety/restless  0     Suicidal thoughts  0     PHQ-9 Score  0     Difficult doing work/chores  Not difficult at all      Most Recent Anxiety Screen:     03/02/2021    9:03 AM  GAD 7 :  Generalized Anxiety Score  Nervous, Anxious, on Edge 0  Control/stop worrying 0  Worry too much - different things 0  Trouble relaxing 0  Restless 0  Easily annoyed or irritable 0  Afraid - awful might happen 0  Total GAD 7 Score 0   Most Recent Fall Screen:    02/27/2024   11:45 AM 12/17/2023    2:58 PM 11/05/2023   11:54 AM 03/27/2023    1:13 PM 02/14/2023    9:05 AM  Fall Risk   Falls in the past year? 1 1 0 0 0  Comment  fell getting out of sons car     Number falls in past yr: 0 0 0 0 0  Injury with Fall? 1 1 0 0 0  Comment fell out of car and broke her arm in April, broke wrist     Risk for fall due to : Other (Comment) Medication side effect No Fall Risks No Fall Risks   Follow up Falls evaluation completed Falls prevention discussed;Falls evaluation  completed Falls evaluation completed Falls evaluation completed     Past medical history, surgical history, medications, allergies, family history and social history reviewed with patient today and changes made to appropriate areas of the chart.  Past Medical History:  Past Medical History:  Diagnosis Date   ACE-inhibitor cough    Acute cystitis with hematuria 07/11/2022   Anemia    Anxiety    Arthritis    COVID-19 06/26/2022   Depression    Dyspareunia    dryness with intercourse   Endometriosis 1982   found at [facility]   Fluid level behind tympanic membrane of both ears 03/02/2021   GERD (gastroesophageal reflux disease)    Heart murmur    History of kidney stones    History of left shoulder fracture 2017   Hypertension    Hypothyroidism    Impacted cerumen of both ears 10/03/2022   Left ACL tear    Osteopenia 2009   Pneumonia    Polycythemia vera (HCC) 12/02/2017   PONV (postoperative nausea and vomiting)    Rhinorrhea 10/09/2021   Right hand pain 11/05/2023   Subacute cough 09/11/2021   Thyroid  nodule    followed by Dr Tommas   Urinary incontinence    with coughing/sneezing   Medications:  Current Outpatient Medications on File Prior to Visit  Medication Sig   aspirin 81 MG tablet Take 81 mg by mouth at bedtime.   azelastine  (ASTELIN ) 0.1 % nasal spray Place 1 spray into both nostrils 2 (two) times daily as needed for rhinitis. Use in each nostril as directed   Cholecalciferol (VITAMIN D3) 50 MCG (2000 UT) TABS Take 2,000 Units by mouth daily.   denosumab (PROLIA) 60 MG/ML SOSY injection 60 mg every 6 (six) months.   fluticasone  (FLONASE ) 50 MCG/ACT nasal spray Place 1 spray into both nostrils daily as needed for allergies.   levothyroxine (SYNTHROID) 75 MCG tablet Take 75 mcg by mouth daily.   loratadine (CLARITIN) 10 MG tablet Take 10 mg by mouth at bedtime.   pantoprazole  (PROTONIX ) 40 MG tablet TAKE 1 TABLET(40 MG) BY MOUTH DAILY   sertraline  (ZOLOFT ) 50 MG tablet  Take 1 tablet (50 mg total) by mouth at bedtime.   tretinoin (RETIN-A) 0.05 % cream Apply 1 Application topically at bedtime as needed. Apply pea sized amount to affected area of face nightly as needed. (Patient not taking: Reported on 02/27/2024)   No current facility-administered medications on file prior to visit.  Surgical History:  Past Surgical History:  Procedure Laterality Date   ABDOMINAL HYSTERECTOMY  07/30/1980   [facility]--for abnormal paps/endometriosis   AUGMENTATION MAMMAPLASTY  07/31/1975   BREAST IMPLANT EXCHANGE  10/2020   COLONOSCOPY WITH PROPOFOL  N/A 10/29/2013   Procedure: COLONOSCOPY WITH PROPOFOL ;  Surgeon: Oliva FORBES Boots, MD;  Location: [facility] ENDOSCOPY;  Service: Endoscopy;  Laterality: N/A;   COLONOSCOPY WITH PROPOFOL  N/A 12/22/2014   Procedure: COLONOSCOPY WITH PROPOFOL ;  Surgeon: Oliva Boots, MD;  Location: [facility] ENDOSCOPY;  Service: Endoscopy;  Laterality: N/A;  ultra slim scope   COSMETIC SURGERY  11/28/2011   face lift,neck lift, eye lift--Dr. Verneda   dilatation and curettage  07/30/1980   HOT HEMOSTASIS N/A 12/22/2014   Procedure: HOT HEMOSTASIS (ARGON PLASMA COAGULATION/BICAP);  Surgeon: Oliva Boots, MD;  Location: [facility] ENDOSCOPY;  Service: Endoscopy;  Laterality: N/A;   PARATHYROIDECTOMY Left 08/15/2023   Procedure: LEFT INFERIOR PARATHYROIDECTOMY;  Surgeon: Eletha Boas, MD;  Location: [facility] ORS;  Service: General;  Laterality: Left;   TONSILLECTOMY     Allergies:  Allergies  Allergen Reactions   Sulfa Antibiotics Hives   Cephalexin  Rash    Face turned bright red.   Diazepam Other (See Comments)    Doesn't like the way it makes her feel.   Family History:  Family History  Problem Relation Age of Onset   Cancer Mother    Cervical cancer Mother    Diabetes Father        adult onset   Stroke Father    Thyroid  disease Sister        hyperthyroid   Asthma Sister    Thyroid  disease Sister        hypothyroid   Cancer Maternal Grandmother    Stomach cancer Maternal  Grandmother        Objective:    BP 120/74   Pulse 68   Ht 5' 1.75 (1.568 m)   Wt 166 lb 6.4 oz (75.5 kg)   LMP 07/30/1980   BMI 30.68 kg/m   Wt Readings from Last 3 Encounters:  02/27/24 166 lb 6.4 oz (75.5 kg)  01/01/24 163 lb (73.9 kg)  11/19/23 162 lb 14.7 oz (73.9 kg)    Physical Exam Vitals and nursing note reviewed.  Constitutional:      General: She is not in acute distress.    Appearance: Normal appearance.  HENT:     Head: Normocephalic and atraumatic.     Right Ear: Hearing, tympanic membrane, ear canal and external ear normal.     Left Ear: Hearing, tympanic membrane, ear canal and external ear normal.     Nose: Nose normal.     Right Sinus: No maxillary sinus tenderness or frontal sinus tenderness.     Left Sinus: No maxillary sinus tenderness or frontal sinus tenderness.     Mouth/Throat:     Lips: Pink.     Mouth: Mucous membranes are moist.     Pharynx: Oropharynx is clear.  Eyes:     General: Lids are normal. Vision grossly intact.     Extraocular Movements: Extraocular movements intact.     Conjunctiva/sclera: Conjunctivae normal.     Pupils: Pupils are equal, round, and reactive to light.     Funduscopic exam:    Right eye: Red reflex present.        Left eye: Red reflex present.    Visual Fields: Right eye visual fields normal and left eye visual fields normal.  Neck:     Thyroid : No  thyromegaly.     Vascular: No carotid bruit.  Cardiovascular:     Rate and Rhythm: Normal rate and regular rhythm.     Chest Wall: PMI is not displaced.     Pulses: Normal pulses.          Dorsalis pedis pulses are 2+ on the right side and 2+ on the left side.       Posterior tibial pulses are 2+ on the right side and 2+ on the left side.     Heart sounds: Normal heart sounds. No murmur heard. Pulmonary:     Effort: Pulmonary effort is normal. No respiratory distress.     Breath sounds: Normal breath sounds.  Abdominal:     General: Abdomen is flat. Bowel  sounds are normal. There is no distension.     Palpations: Abdomen is soft. There is no hepatomegaly, splenomegaly or mass.     Tenderness: There is no abdominal tenderness. There is no right CVA tenderness, left CVA tenderness, guarding or rebound.  Musculoskeletal:        General: Normal range of motion.     Cervical back: Full passive range of motion without pain, normal range of motion and neck supple. No tenderness.     Right lower leg: No edema.     Left lower leg: No edema.  Feet:     Left foot:     Toenail Condition: Left toenails are normal.  Lymphadenopathy:     Cervical: No cervical adenopathy.     Upper Body:     Right upper body: No supraclavicular adenopathy.     Left upper body: No supraclavicular adenopathy.  Skin:    General: Skin is warm and dry.     Capillary Refill: Capillary refill takes less than 2 seconds.     Nails: There is no clubbing.  Neurological:     General: No focal deficit present.     Mental Status: She is alert and oriented to person, place, and time.     GCS: GCS eye subscore is 4. GCS verbal subscore is 5. GCS motor subscore is 6.     Sensory: Sensation is intact. No sensory deficit.     Motor: Motor function is intact.     Coordination: Coordination is intact.     Gait: Gait is intact.     Deep Tendon Reflexes: Reflexes are normal and symmetric.     Comments: Mild weakness noted in right arm, due to recent fracture- not concerning.   Psychiatric:        Attention and Perception: Attention normal.        Mood and Affect: Mood normal.        Speech: Speech normal.        Behavior: Behavior normal. Behavior is cooperative.        Thought Content: Thought content normal.        Cognition and Memory: Cognition and memory normal.        Judgment: Judgment normal.      Results for orders placed or performed in visit on 02/27/24  CBC with Differential/Platelet   Collection Time: 02/27/24  2:43 PM  Result Value Ref Range   WBC 4.9 3.4 - 10.8  x10E3/uL   RBC 5.43 (H) 3.77 - 5.28 x10E6/uL   Hemoglobin 11.6 11.1 - 15.9 g/dL   Hematocrit 59.7 65.9 - 46.6 %   MCV 74 (L) 79 - 97 fL   MCH 21.4 (L) 26.6 - 33.0 pg  MCHC 28.9 (L) 31.5 - 35.7 g/dL   RDW 84.1 (H) 88.2 - 84.5 %   Platelets 314 150 - 450 x10E3/uL   Neutrophils 50 Not Estab. %   Lymphs 31 Not Estab. %   Monocytes 12 Not Estab. %   Eos 6 Not Estab. %   Basos 1 Not Estab. %   Neutrophils Absolute 2.4 1.4 - 7.0 x10E3/uL   Lymphocytes Absolute 1.5 0.7 - 3.1 x10E3/uL   Monocytes Absolute 0.6 0.1 - 0.9 x10E3/uL   EOS (ABSOLUTE) 0.3 0.0 - 0.4 x10E3/uL   Basophils Absolute 0.1 0.0 - 0.2 x10E3/uL   Immature Granulocytes 0 Not Estab. %   Immature Grans (Abs) 0.0 0.0 - 0.1 x10E3/uL  CMP14+EGFR   Collection Time: 02/27/24  2:43 PM  Result Value Ref Range   Glucose 84 70 - 99 mg/dL   BUN 20 8 - 27 mg/dL   Creatinine, Ser 9.28 0.57 - 1.00 mg/dL   eGFR 87 >40 fO/fpw/8.26   BUN/Creatinine Ratio 28 12 - 28   Sodium 141 134 - 144 mmol/L   Potassium 4.6 3.5 - 5.2 mmol/L   Chloride 104 96 - 106 mmol/L   CO2 21 20 - 29 mmol/L   Calcium 8.5 (L) 8.7 - 10.3 mg/dL   Total Protein 6.9 6.0 - 8.5 g/dL   Albumin 4.4 3.8 - 4.8 g/dL   Globulin, Total 2.5 1.5 - 4.5 g/dL   Bilirubin Total 0.7 0.0 - 1.2 mg/dL   Alkaline Phosphatase 111 44 - 121 IU/L   AST 23 0 - 40 IU/L   ALT 23 0 - 32 IU/L  Hemoglobin A1c   Collection Time: 02/27/24  2:43 PM  Result Value Ref Range   Hgb A1c MFr Bld 6.1 (H) 4.8 - 5.6 %   Est. average glucose Bld gHb Est-mCnc 128 mg/dL  Lipid panel   Collection Time: 02/27/24  2:43 PM  Result Value Ref Range   Cholesterol, Total 160 100 - 199 mg/dL   Triglycerides 899 0 - 149 mg/dL   HDL 44 >60 mg/dL   VLDL Cholesterol Cal 18 5 - 40 mg/dL   LDL Chol Calc (NIH) 98 0 - 99 mg/dL   Chol/HDL Ratio 3.6 0.0 - 4.4 ratio  TSH   Collection Time: 02/27/24  2:43 PM  Result Value Ref Range   TSH 1.530 0.450 - 4.500 uIU/mL  VITAMIN D  25 Hydroxy (Vit-D Deficiency, Fractures)    Collection Time: 02/27/24  2:43 PM  Result Value Ref Range   Vit D, 25-Hydroxy 31.3 30.0 - 100.0 ng/mL       Assessment & Plan:   Problem List Items Addressed This Visit     Polycythemia vera (HCC) (Chronic)   Managed with Dr. Timmy. We discussed iron deficiency today and her symptoms. I have suggested considering iron infusions given that she has been unable to tolerate oral iron due to GI distress. She plans to discuss this with Dr. FORBES. I will be happy to place the order if this is something she wishes to proceed with.       Hypothyroidism   Managed with endocrinology with no concerning symptoms at this time. Levothyroxine 75mcg daily.       Relevant Orders   CBC with Differential/Platelet (Completed)   CMP14+EGFR (Completed)   Hemoglobin A1c (Completed)   Lipid panel (Completed)   TSH (Completed)   Elevated LDL cholesterol level   Diet managed with no concerning symptoms at this time. Will repeat labs today. Low fat,  high fiber diet recommended with daily activity.       Relevant Orders   CBC with Differential/Platelet (Completed)   CMP14+EGFR (Completed)   Hemoglobin A1c (Completed)   Lipid panel (Completed)   TSH (Completed)   Age-related osteoporosis without current pathological fracture   Recent fracture of the humerus with healing. No current fracture. She is managed with Prolia every 6 months by endocrine. No changes to plan of care at this time. Fall risks reviewed.       Relevant Medications   denosumab (PROLIA) 60 MG/ML SOSY injection   Nontoxic single thyroid  nodule   Thyroid  nodules are stable. Recent check-up with thyroid  surgeon Dr. Eletha showed no significant changes.      Encounter for annual physical exam - Primary   CPE completed today. Review of HM activities and recommendations discussed and provided on AVS. Anticipatory guidance, diet, and exercise recommendations provided. Medications, allergies, and hx reviewed and updated as necessary.  Orders placed as listed below.  Plan: - Labs ordered. Will make changes as necessary based on results.  - I will review these results and send recommendations via MyChart or a telephone call.  - F/U with CPE in 1 year or sooner for acute/chronic health needs as directed.        Primary hypertension   Excellent BP control today. Not currently on medications. Recommend continuation of diet an exercise.       Relevant Orders   CBC with Differential/Platelet (Completed)   CMP14+EGFR (Completed)   Hemoglobin A1c (Completed)   Lipid panel (Completed)   TSH (Completed)   Pancreatic cyst   Pancreatic nodules are stable with minimal growth noted on recent MRI.      Primary hyperparathyroidism (HCC)   S/P parathyroid  removal. Managed with calcium and vitamin D  supplementation. Will monitor levels today.       Gastroesophageal reflux disease with esophagitis   GERD symptoms are well-controlled with pantoprazole . Discussed potential side effects of long-term use, including changes in calcium absorption and potential bacterial growth due to reduced stomach acid. Considered trial of pantoprazole  every other day to assess symptom control. - Consider trial of pantoprazole  every other day to assess symptom control      Acute bacterial conjunctivitis of left eye   Left eye shows signs of mild infection, possibly due to allergens or bacterial growth. - Apply warm compresses to left eye - Use prescribed antibiotic eye drops      Relevant Medications   ofloxacin  (OCUFLOX ) 0.3 % ophthalmic solution   Vitamin D  deficiency   Relevant Orders   CBC with Differential/Platelet (Completed)   CMP14+EGFR (Completed)   Hemoglobin A1c (Completed)   Lipid panel (Completed)   TSH (Completed)   VITAMIN D  25 Hydroxy (Vit-D Deficiency, Fractures) (Completed)      Follow up plan: Return in about 6 months (around 08/29/2024) for Med Management 30. Time: 49 minutes, >50% spent counseling, care  coordination, chart review, and documentation.   NEXT PREVENTATIVE PHYSICAL DUE IN 1 YEAR.  PATIENT COUNSELING PROVIDED FOR ALL ADULT PATIENTS: A well balanced diet low in saturated fats, cholesterol, and moderation in carbohydrates.  This can be as simple as monitoring portion sizes and cutting back on sugary beverages such as soda and juice to start with.    Daily water consumption of at least 64 ounces.  Physical activity at least 180 minutes per week.  If just starting out, start 10 minutes a day and work your way up.   This can be  as simple as taking the stairs instead of the elevator and walking 2-3 laps around the office  purposefully every day.   STD protection, partner selection, and regular testing if high risk.  Limited consumption of alcoholic beverages if alcohol is consumed. For men, I recommend no more than 14 alcoholic beverages per week, spread out throughout the week (max 2 per day). Avoid binge drinking or consuming large quantities of alcohol in one setting.  Please let me know if you feel you may need help with reduction or quitting alcohol consumption.   Avoidance of nicotine, if used. Please let me know if you feel you may need help with reduction or quitting nicotine use.   Daily mental health attention. This can be in the form of 5 minute daily meditation, prayer, journaling, yoga, reflection, etc.  Purposeful attention to your emotions and mental state can significantly improve your overall wellbeing  and  Health.  Please know that I am here to help you with all of your health care goals and am happy to work with you to find a solution that works best for you.  The greatest advice I have received with any changes in life are to take it one step at a time, that even means if all you can focus on is the next 60 seconds, then do that and celebrate your victories.  With any changes in life, you will have set backs, and that is OK. The important thing to remember  is, if you have a set back, it is not a failure, it is an opportunity to try again! Screening Testing Mammogram Every 1 -2 years based on history and risk factors Starting at age 15 Pap Smear Ages 21-39 every 3 years Ages 80-65 every 5 years with HPV testing More frequent testing may be required based on results and history Colon Cancer Screening Every 1-10 years based on test performed, risk factors, and history Starting at age 68 Bone Density Screening Every 2-10 years based on history Starting at age 60 for women Recommendations for men differ based on medication usage, history, and risk factors AAA Screening One time ultrasound Men 3-43 years old who have every smoked Lung Cancer Screening Low Dose Lung CT every 12 months Age 69-80 years with a 30 pack-year smoking history who still smoke or who have quit within the last 15 years   Screening Labs Routine  Labs: Complete Blood Count (CBC), Complete Metabolic Panel (CMP), Cholesterol (Lipid Panel) Every 6-12 months based on history and medications May be recommended more frequently based on current conditions or previous results Hemoglobin A1c Lab Every 3-12 months based on history and previous results Starting at age 64 or earlier with diagnosis of diabetes, high cholesterol, BMI >26, and/or risk factors Frequent monitoring for patients with diabetes to ensure blood sugar control Thyroid  Panel (TSH) Every 6 months based on history, symptoms, and risk factors May be repeated more often if on medication HIV One time testing for all patients 64 and older May be repeated more frequently for patients with increased risk factors or exposure Hepatitis C One time testing for all patients 32 and older May be repeated more frequently for patients with increased risk factors or exposure Gonorrhea, Chlamydia Every 12 months for all sexually active persons 13-24 years Additional monitoring may be recommended for those who are  considered high risk or who have symptoms Every 12 months for any woman on birth control, regardless of sexual activity PSA Men 108-51 years old with  risk factors Additional screening may be recommended from age 86-69 based on risk factors, symptoms, and history  Vaccine Recommendations Tetanus Booster All adults every 10 years Flu Vaccine All patients 6 months and older every year COVID Vaccine All patients 12 years and older Initial dosing with booster May recommend additional booster based on age and health history HPV Vaccine 2 doses all patients age 20-26 Dosing may be considered for patients over 26 Shingles Vaccine (Shingrix) 2 doses all adults 55 years and older Pneumonia (Pneumovax 47) All adults 65 years and older May recommend earlier dosing based on health history One year apart from Prevnar 13 Pneumonia (Prevnar 49) All adults 65 years and older Dosed 1 year after Pneumovax 23 Pneumonia (Prevnar 20) One time alternative to the two dosing of 13 and 23 For all adults with initial dose of 23, 20 is recommended 1 year later For all adults with initial dose of 13, 23 is still recommended as second option 1 year later

## 2024-02-27 ENCOUNTER — Encounter: Payer: Self-pay | Admitting: Nurse Practitioner

## 2024-02-27 ENCOUNTER — Ambulatory Visit (INDEPENDENT_AMBULATORY_CARE_PROVIDER_SITE_OTHER): Admitting: Nurse Practitioner

## 2024-02-27 VITALS — BP 120/74 | HR 68 | Ht 61.75 in | Wt 166.4 lb

## 2024-02-27 DIAGNOSIS — D45 Polycythemia vera: Secondary | ICD-10-CM

## 2024-02-27 DIAGNOSIS — E210 Primary hyperparathyroidism: Secondary | ICD-10-CM

## 2024-02-27 DIAGNOSIS — K862 Cyst of pancreas: Secondary | ICD-10-CM

## 2024-02-27 DIAGNOSIS — E038 Other specified hypothyroidism: Secondary | ICD-10-CM

## 2024-02-27 DIAGNOSIS — E7800 Pure hypercholesterolemia, unspecified: Secondary | ICD-10-CM

## 2024-02-27 DIAGNOSIS — K2100 Gastro-esophageal reflux disease with esophagitis, without bleeding: Secondary | ICD-10-CM

## 2024-02-27 DIAGNOSIS — E559 Vitamin D deficiency, unspecified: Secondary | ICD-10-CM | POA: Diagnosis not present

## 2024-02-27 DIAGNOSIS — I10 Essential (primary) hypertension: Secondary | ICD-10-CM | POA: Diagnosis not present

## 2024-02-27 DIAGNOSIS — Z0000 Encounter for general adult medical examination without abnormal findings: Secondary | ICD-10-CM | POA: Diagnosis not present

## 2024-02-27 DIAGNOSIS — E041 Nontoxic single thyroid nodule: Secondary | ICD-10-CM

## 2024-02-27 DIAGNOSIS — H1032 Unspecified acute conjunctivitis, left eye: Secondary | ICD-10-CM

## 2024-02-27 DIAGNOSIS — M810 Age-related osteoporosis without current pathological fracture: Secondary | ICD-10-CM

## 2024-02-27 LAB — LIPID PANEL

## 2024-02-27 MED ORDER — OFLOXACIN 0.3 % OP SOLN: 1.0000 [drp] | Freq: Four times a day (QID) | OPHTHALMIC | 0 refills | Status: AC

## 2024-02-27 NOTE — Patient Instructions (Addendum)
 You can try Jergins Natural Glow self tanner on your legs to help reduce the appearance of the veins. This can be used on your entire body, if you like. It seems to be one of the most natural looking.   Ask Dr. Timmy about the iron infusions. This should help with your energy in the afternoons.   You can use the debrox ear drops (in the box) to help remove the wax. To help prevent wax build-up, you can put hydrogen peroxide in the ear and allow this to sit for 3-4 minutes then drain out with gravity. If your ear feels wet, a drop of rubbing alcohol can help dry this out.   I sent eye drops to the pharmacy

## 2024-02-28 DIAGNOSIS — H1032 Unspecified acute conjunctivitis, left eye: Secondary | ICD-10-CM

## 2024-02-28 HISTORY — DX: Unspecified acute conjunctivitis, left eye: H10.32

## 2024-02-28 LAB — CBC WITH DIFFERENTIAL/PLATELET
Basophils Absolute: 0.1 x10E3/uL (ref 0.0–0.2)
Basos: 1 %
EOS (ABSOLUTE): 0.3 x10E3/uL (ref 0.0–0.4)
Eos: 6 %
Hematocrit: 40.2 % (ref 34.0–46.6)
Hemoglobin: 11.6 g/dL (ref 11.1–15.9)
Immature Grans (Abs): 0 x10E3/uL (ref 0.0–0.1)
Immature Granulocytes: 0 %
Lymphocytes Absolute: 1.5 x10E3/uL (ref 0.7–3.1)
Lymphs: 31 %
MCH: 21.4 pg — ABNORMAL LOW (ref 26.6–33.0)
MCHC: 28.9 g/dL — ABNORMAL LOW (ref 31.5–35.7)
MCV: 74 fL — ABNORMAL LOW (ref 79–97)
Monocytes Absolute: 0.6 x10E3/uL (ref 0.1–0.9)
Monocytes: 12 %
Neutrophils Absolute: 2.4 x10E3/uL (ref 1.4–7.0)
Neutrophils: 50 %
Platelets: 314 x10E3/uL (ref 150–450)
RBC: 5.43 x10E6/uL — ABNORMAL HIGH (ref 3.77–5.28)
RDW: 15.8 % — ABNORMAL HIGH (ref 11.7–15.4)
WBC: 4.9 x10E3/uL (ref 3.4–10.8)

## 2024-02-28 LAB — CMP14+EGFR
ALT: 23 IU/L (ref 0–32)
AST: 23 IU/L (ref 0–40)
Albumin: 4.4 g/dL (ref 3.8–4.8)
Alkaline Phosphatase: 111 IU/L (ref 44–121)
BUN/Creatinine Ratio: 28 (ref 12–28)
BUN: 20 mg/dL (ref 8–27)
Bilirubin Total: 0.7 mg/dL (ref 0.0–1.2)
CO2: 21 mmol/L (ref 20–29)
Calcium: 8.5 mg/dL — AB (ref 8.7–10.3)
Chloride: 104 mmol/L (ref 96–106)
Creatinine, Ser: 0.71 mg/dL (ref 0.57–1.00)
Globulin, Total: 2.5 g/dL (ref 1.5–4.5)
Glucose: 84 mg/dL (ref 70–99)
Potassium: 4.6 mmol/L (ref 3.5–5.2)
Sodium: 141 mmol/L (ref 134–144)
Total Protein: 6.9 g/dL (ref 6.0–8.5)
eGFR: 87 mL/min/1.73 (ref >59)

## 2024-02-28 LAB — TSH: TSH: 1.53 u[IU]/mL (ref 0.450–4.500)

## 2024-02-28 LAB — LIPID PANEL
Cholesterol, Total: 160 mg/dL (ref 100–199)
HDL: 44 mg/dL (ref >39)
LDL CALC COMMENT:: 3.6 ratio (ref 0.0–4.4)
LDL Chol Calc (NIH): 98 mg/dL (ref 0–99)
Triglycerides: 100 mg/dL (ref 0–149)
VLDL Cholesterol Cal: 18 mg/dL (ref 5–40)

## 2024-02-28 LAB — HEMOGLOBIN A1C
Est. average glucose Bld gHb Est-mCnc: 128 mg/dL
Hgb A1c MFr Bld: 6.1 — AB (ref 4.8–5.6)

## 2024-02-28 LAB — VITAMIN D 25 HYDROXY (VIT D DEFICIENCY, FRACTURES): Vit D, 25-Hydroxy: 31.3 ng/mL (ref 30.0–100.0)

## 2024-02-28 NOTE — Assessment & Plan Note (Signed)
 Pancreatic nodules are stable with minimal growth noted on recent MRI.

## 2024-02-28 NOTE — Assessment & Plan Note (Signed)
 Left eye shows signs of mild infection, possibly due to allergens or bacterial growth. - Apply warm compresses to left eye - Use prescribed antibiotic eye drops

## 2024-02-28 NOTE — Assessment & Plan Note (Signed)
 Managed with Dr. Timmy. We discussed iron deficiency today and her symptoms. I have suggested considering iron infusions given that she has been unable to tolerate oral iron due to GI distress. She plans to discuss this with Dr. FORBES. I will be happy to place the order if this is something she wishes to proceed with.

## 2024-02-28 NOTE — Assessment & Plan Note (Signed)
 Thyroid  nodules are stable. Recent check-up with thyroid  surgeon Dr. Eletha showed no significant changes.

## 2024-02-28 NOTE — Assessment & Plan Note (Signed)

## 2024-02-28 NOTE — Assessment & Plan Note (Signed)
 GERD symptoms are well-controlled with pantoprazole . Discussed potential side effects of long-term use, including changes in calcium absorption and potential bacterial growth due to reduced stomach acid. Considered trial of pantoprazole  every other day to assess symptom control. - Consider trial of pantoprazole  every other day to assess symptom control

## 2024-02-28 NOTE — Assessment & Plan Note (Signed)
 Diet managed with no concerning symptoms at this time. Will repeat labs today. Low fat, high fiber diet recommended with daily activity.

## 2024-02-28 NOTE — Assessment & Plan Note (Signed)
 Recent fracture of the humerus with healing. No current fracture. She is managed with Prolia every 6 months by endocrine. No changes to plan of care at this time. Fall risks reviewed.

## 2024-02-28 NOTE — Assessment & Plan Note (Signed)
 Managed with endocrinology with no concerning symptoms at this time. Levothyroxine 75mcg daily.

## 2024-02-28 NOTE — Assessment & Plan Note (Signed)
 S/P parathyroid  removal. Managed with calcium and vitamin D  supplementation. Will monitor levels today.

## 2024-02-28 NOTE — Assessment & Plan Note (Signed)
 Excellent BP control today. Not currently on medications. Recommend continuation of diet an exercise.

## 2024-03-03 ENCOUNTER — Ambulatory Visit: Payer: Self-pay | Admitting: Nurse Practitioner

## 2024-03-12 DIAGNOSIS — K080 Exfoliation of teeth due to systemic causes: Secondary | ICD-10-CM | POA: Diagnosis not present

## 2024-04-02 DIAGNOSIS — D225 Melanocytic nevi of trunk: Secondary | ICD-10-CM | POA: Diagnosis not present

## 2024-04-02 DIAGNOSIS — L821 Other seborrheic keratosis: Secondary | ICD-10-CM | POA: Diagnosis not present

## 2024-04-02 DIAGNOSIS — L309 Dermatitis, unspecified: Secondary | ICD-10-CM | POA: Diagnosis not present

## 2024-04-06 ENCOUNTER — Emergency Department (HOSPITAL_COMMUNITY)

## 2024-04-06 ENCOUNTER — Emergency Department (HOSPITAL_COMMUNITY)
Admission: EM | Admit: 2024-04-06 | Discharge: 2024-04-06 | Disposition: A | Discharge status: Home / Self Care | Attending: Emergency Medicine | Admitting: Emergency Medicine

## 2024-04-06 ENCOUNTER — Encounter (HOSPITAL_COMMUNITY): Payer: Self-pay

## 2024-04-06 ENCOUNTER — Ambulatory Visit: Payer: Self-pay

## 2024-04-06 ENCOUNTER — Other Ambulatory Visit: Payer: Self-pay

## 2024-04-06 DIAGNOSIS — M549 Dorsalgia, unspecified: Secondary | ICD-10-CM | POA: Diagnosis not present

## 2024-04-06 DIAGNOSIS — Z7982 Long term (current) use of aspirin: Secondary | ICD-10-CM | POA: Diagnosis not present

## 2024-04-06 DIAGNOSIS — R059 Cough, unspecified: Secondary | ICD-10-CM | POA: Diagnosis not present

## 2024-04-06 DIAGNOSIS — T148XXA Other injury of unspecified body region, initial encounter: Secondary | ICD-10-CM

## 2024-04-06 DIAGNOSIS — S39012A Strain of muscle, fascia and tendon of lower back, initial encounter: Secondary | ICD-10-CM | POA: Diagnosis not present

## 2024-04-06 MED ORDER — LIDOCAINE 5 % EX PTCH: 1.0000 | MEDICATED_PATCH | CUTANEOUS | 0 refills | Status: AC

## 2024-04-06 MED ORDER — OXYCODONE-ACETAMINOPHEN 5-325 MG PO TABS: 1.0000 | ORAL_TABLET | Freq: Four times a day (QID) | ORAL | 0 refills | Status: DC | PRN

## 2024-04-06 MED ORDER — OXYCODONE-ACETAMINOPHEN 5-325 MG PO TABS
2.0000 | ORAL_TABLET | Freq: Once | ORAL | Status: AC
  Administered 2024-04-06: 2 via ORAL
  Filled 2024-04-06: qty 2

## 2024-04-06 MED ORDER — METHOCARBAMOL 500 MG PO TABS: 500.0000 mg | ORAL_TABLET | Freq: Two times a day (BID) | ORAL | 0 refills | Status: DC

## 2024-04-06 MED ORDER — LIDOCAINE 5 % EX PTCH
1.0000 | MEDICATED_PATCH | CUTANEOUS | Status: DC
  Administered 2024-04-06: 1 via TRANSDERMAL
  Filled 2024-04-06: qty 1

## 2024-04-06 MED ORDER — KETOROLAC TROMETHAMINE 30 MG/ML IJ SOLN
15.0000 mg | Freq: Once | INTRAMUSCULAR | Status: AC
  Administered 2024-04-06: 15 mg via INTRAMUSCULAR
  Filled 2024-04-06: qty 1

## 2024-04-06 NOTE — ED Triage Notes (Signed)
 Pt BIB EMS from Home due to sudden onset of low back pain/muscle spasms. Pt report symptoms started after strong cough. Hx of Fall last week with pain in right leg. Pt unclear if fall has anything to do with back pain today.

## 2024-04-06 NOTE — ED Provider Notes (Signed)
 [place] EMERGENCY DEPARTMENT AT [facility] Provider Note   CSN: [id number] Arrival date & time: 04/06/24  1206     Patient presents with: Back Pain   Heather Brown is a 78 y.o. female.   78 year old female presents with sudden onset of right-sided back pain after she was sitting on toilet and she coughed.  No recent fever.  Not having chest pain.  She does not feel severely short of breath.  States it feels as if a spasm starts in her mid lateral spine.  Denies any hemoptysis.  No urinary symptoms.  Called EMS and transported here.       Prior to Admission medications   Medication Sig Start Date End Date Taking? Authorizing Provider  aspirin 81 MG tablet Take 81 mg by mouth at bedtime.    [provider]  azelastine  (ASTELIN ) 0.1 % nasal spray Place 1 spray into both nostrils 2 (two) times daily as needed for rhinitis. Use in each nostril as directed    [provider]  Cholecalciferol (VITAMIN D3) 50 MCG (2000 UT) TABS Take 2,000 Units by mouth daily.    [provider]  denosumab  (PROLIA ) 60 MG/ML SOSY injection 60 mg every 6 (six) months. 11/28/23   [provider]  fluticasone  (FLONASE ) 50 MCG/ACT nasal spray Place 1 spray into both nostrils daily as needed for allergies.    [provider]  levothyroxine (SYNTHROID) 75 MCG tablet Take 75 mcg by mouth daily. 04/08/19   [provider]  loratadine (CLARITIN) 10 MG tablet Take 10 mg by mouth at bedtime. 07/05/22   [provider]  ofloxacin  (OCUFLOX ) 0.3 % ophthalmic solution Place 1 drop into the right eye 4 (four) times daily. 02/27/24   Early, Sara E, NP  pantoprazole  (PROTONIX ) 40 MG tablet TAKE 1 TABLET(40 MG) BY MOUTH DAILY 11/18/23   Early, Camie BRAVO, NP  sertraline  (ZOLOFT ) 50 MG tablet Take 1 tablet (50 mg total) by mouth at bedtime. 04/26/23   Early, Sara E, NP  tretinoin (RETIN-A) 0.05 % cream Apply 1 Application topically at bedtime as needed.  Apply pea sized amount to affected area of face nightly as needed. Patient not taking: Reported on 02/27/2024 01/09/18   [provider]    Allergies: Sulfa antibiotics, Cephalexin , and Diazepam    Review of Systems  All other systems reviewed and are negative.   Updated Vital Signs BP (!) 146/114 (BP Location: Right Arm)   Pulse 92   Temp 98.3 F (36.8 C) (Oral)   Resp 18   LMP 07/30/1980   SpO2 95%   Physical Exam Vitals and nursing note reviewed.  Constitutional:      General: She is not in acute distress.    Appearance: Normal appearance. She is well-developed. She is not toxic-appearing.  HENT:     Head: Normocephalic and atraumatic.  Eyes:     General: Lids are normal.     Conjunctiva/sclera: Conjunctivae normal.     Pupils: Pupils are equal, round, and reactive to light.  Neck:     Thyroid : No thyroid  mass.     Trachea: No tracheal deviation.  Cardiovascular:     Rate and Rhythm: Normal rate and regular rhythm.     Heart sounds: Normal heart sounds. No murmur heard.    No gallop.  Pulmonary:     Effort: Pulmonary effort is normal. No respiratory distress.     Breath sounds: Normal breath sounds. No stridor. No decreased breath sounds,  wheezing, rhonchi or rales.  Abdominal:     General: There is no distension.     Palpations: Abdomen is soft.     Tenderness: There is no abdominal tenderness. There is no rebound.  Musculoskeletal:        General: No tenderness. Normal range of motion.     Cervical back: Normal range of motion and neck supple.       Back:  Skin:    General: Skin is warm and dry.     Findings: No abrasion or rash.  Neurological:     Mental Status: She is alert and oriented to person, place, and time. Mental status is at baseline.     GCS: GCS eye subscore is 4. GCS verbal subscore is 5. GCS motor subscore is 6.     Cranial Nerves: No cranial nerve deficit.     Sensory: No sensory deficit.     Motor: Motor function is intact.   Psychiatric:        Attention and Perception: Attention normal.        Speech: Speech normal.        Behavior: Behavior normal.     (all labs ordered are listed, but only abnormal results are displayed) Labs Reviewed - No data to display  EKG: None  Radiology: No results found.   Procedures   Medications Ordered in the ED  oxyCODONE -acetaminophen  (PERCOCET/ROXICET) 5-325 MG per tablet 2 tablet (has no administration in time range)                                    Medical Decision Making Amount and/or Complexity of Data Reviewed Radiology: ordered.  Risk Prescription drug management.   Patient given pain medications here and feels better.  Suspect musculoskeletal etiology.  Will need to check chest x-ray to make sure that she does not have any other thing such as pneumothorax.  She is not short of breath.  Care turned over to Dr. Veronia     Final diagnoses:  None    ED Discharge Orders     None          Dasie Faden, MD 04/06/24 1530

## 2024-04-06 NOTE — ED Provider Notes (Signed)
 Patient's x-ray is benign.  She is feeling better.  Vital signs are reassuring.  Will give discharge instructions and recommend PCP follow-up.   Freddi Hamilton, MD 04/06/24 [phone]

## 2024-04-06 NOTE — Telephone Encounter (Signed)
 FYI Only or Action Required?: FYI only for provider.  Patient was last seen in primary care on 02/27/2024 by Early, Camie BRAVO, NP.  Called Nurse Triage reporting Flank Pain.  Symptoms began today.  Interventions attempted: Nothing.  Symptoms are: unchanged.  Triage Disposition: Go to ED Now (Notify PCP)  Patient/caregiver understands and will follow disposition?: Yes. Reason for Disposition  [1] SEVERE pain (e.g., excruciating, scale 8-10) AND [2] present > 1 hour  Answer Assessment - Initial Assessment Questions Patient's husband Guillermina on the phone as well. Patient states is in a cramp, fell a week ago, bruise on left leg. Hx of kidney stones.Patient having trouble talking in full sentences and stopping frequently to groan from the pain. Advised patient to ED for symptoms.  1. LOCATION: Where does it hurt? (e.g., left, right)     Right side, right under bra line on the side/back  2. ONSET: When did the pain start?     This morning   3. SEVERITY: How bad is the pain? (e.g., Scale 1-10; mild, moderate, or severe)     Severe, can't cough, stand or sit down, unable to talk in full sentences due to pain  4. PATTERN: Does the pain come and go, or is it constant?      Worse with movement   5. CAUSE: What do you think is causing the pain?     Unsure  6. OTHER SYMPTOMS:  Do you have any other symptoms? (e.g., fever, abdomen pain, vomiting, leg weakness, burning with urination, blood in urine)     No  Protocols used: Flank Pain-A-AH  Copied from CRM #[id number]. Topic: Clinical - Red Word Triage >> Apr 06, 2024 10:19 AM Treva T wrote: [facility] that prompted transfer to Nurse Triage: Received call from patient, reports she is in extreme left side pain.

## 2024-04-06 NOTE — Discharge Instructions (Addendum)
 We are providing him with different pain medicines and muscle relaxers.  Have caution as these can cause dizziness and other side effects.  Do not drive or.  Heavy machinery.  Follow-up with your primary care provider.  Return to the ER for any new or worsening symptoms.

## 2024-04-13 ENCOUNTER — Telehealth: Payer: Self-pay

## 2024-04-13 DIAGNOSIS — K2100 Gastro-esophageal reflux disease with esophagitis, without bleeding: Secondary | ICD-10-CM

## 2024-04-15 ENCOUNTER — Telehealth: Payer: Self-pay | Admitting: Licensed Clinical Social Worker

## 2024-04-15 NOTE — Patient Outreach (Signed)
 LCSW introduced self and explained referral from [facility]. Pt was unavailable to talk and requested a call back Monday, 09/22. Appt scheduled with first available LCSW.

## 2024-04-20 ENCOUNTER — Other Ambulatory Visit: Payer: Self-pay | Admitting: Licensed Clinical Social Worker

## 2024-04-20 ENCOUNTER — Other Ambulatory Visit: Payer: Self-pay

## 2024-04-20 NOTE — Patient Outreach (Signed)
 Complex Care Management   Visit Note  04/20/2024  Name:  Heather Brown MRN: [id number] DOB: 18-Jan-1946  Situation: Referral received for Complex Care Management related to anxiety issues (GAD) I obtained verbal consent from Patient.  Visit completed with Patient  on the phone  Background:   Past Medical History:  Diagnosis Date   ACE-inhibitor cough    Acute cystitis with hematuria 07/11/2022   Anemia    Anxiety    Arthritis    COVID-19 06/26/2022   Depression    Dyspareunia    dryness with intercourse   Endometriosis 1982   found at [facility]   Fluid level behind tympanic membrane of both ears 03/02/2021   GERD (gastroesophageal reflux disease)    Heart murmur    History of kidney stones    History of left shoulder fracture 2017   Hypertension    Hypothyroidism    Impacted cerumen of both ears 10/03/2022   Left ACL tear    Osteopenia 2009   Pneumonia    Polycythemia vera (HCC) 12/02/2017   PONV (postoperative nausea and vomiting)    Rhinorrhea 10/09/2021   Right hand pain 11/05/2023   Subacute cough 09/11/2021   Thyroid  nodule    followed by Dr Tommas   Urinary incontinence    with coughing/sneezing    Assessment: Patient Reported Symptoms:  Cognitive Cognitive Status: Alert and oriented to person, place, and time Cognitive/Intellectual Conditions Management [RPT]: None reported or documented in medical history or problem list   Health Maintenance Behaviors: Annual physical exam, Sleep adequate, Hobbies Health Facilitated by: Rest, Stress management  Neurological Neurological Review of Symptoms: Vision changes, Weakness Neurological Management Strategies: Adequate rest, Coping strategies  HEENT HEENT Symptoms Reported: No symptoms reported HEENT Management Strategies: Adequate rest, Coping strategies    Cardiovascular Cardiovascular Symptoms Reported: Fatigue Cardiovascular Management Strategies: Adequate rest, Coping strategies  Respiratory Respiratory  Symptoms Reported: No symptoms reported Respiratory Management Strategies: Adequate rest, Coping strategies  Endocrine Endocrine Symptoms Reported: Weakness or fatigue    Gastrointestinal Gastrointestinal Symptoms Reported: No symptoms reported Additional Gastrointestinal Details: GERD Gastrointestinal Management Strategies: Adequate rest    Genitourinary Genitourinary Symptoms Reported: No symptoms reported Genitourinary Management Strategies: Adequate rest, Coping strategies  Integumentary Integumentary Symptoms Reported: No symptoms reported Skin Management Strategies: Adequate rest  Musculoskeletal Musculoskelatal Symptoms Reviewed: Muscle pain, Back pain Additional Musculoskeletal Details: reported recent back pain Musculoskeletal Management Strategies: Adequate rest, Coping strategies      Psychosocial Psychosocial Symptoms Reported: Anxiety - if selected complete GAD Additional Psychological Details: GAD; Depression Behavioral Management Strategies: Coping strategies Major Change/Loss/Stressor/Fears (CP): Medical condition, self Techniques to Cope with Loss/Stress/Change: Counseling, Diversional activities, Exercise, Support group Quality of Family Relationships: supportive Do you feel physically threatened by others?: No    04/20/2024    PHQ2-9 Depression Screening   Little interest or pleasure in doing things Several days  Feeling down, depressed, or hopeless Several days  PHQ-2 - Total Score 2  Trouble falling or staying asleep, or sleeping too much Not at all  Feeling tired or having little energy Several days  Poor appetite or overeating  Not at all  Feeling bad about yourself - or that you are a failure or have let yourself or your family down Not at all  Trouble concentrating on things, such as reading the newspaper or watching television Several days  Moving or speaking so slowly that other people could have noticed.  Or the opposite - being so fidgety or restless  that you  have been moving around a lot more than usual Several days  Thoughts that you would be better off dead, or hurting yourself in some way Not at all  PHQ2-9 Total Score 5  If you checked off any problems, how difficult have these problems made it for you to do your work, take care of things at home, or get along with other people Somewhat difficult  Depression Interventions/Treatment Counseling    Vitals:   Client did not mention any problems with BP readings Medications Reviewed Today     Reviewed by Frances Ozell GORMAN KEN (Social Worker) on 04/20/24 at [id number]  Med List Status: <None>   Medication Order Taking? Sig Documenting Provider Last Dose Status Informant  aspirin 81 MG tablet [id number] Yes Take 81 mg by mouth at bedtime. [provider]  Active Self, Pharmacy Records  azelastine  (ASTELIN ) 0.1 % nasal spray [id number] Yes Place 1 spray into both nostrils 2 (two) times daily as needed for rhinitis. Use in each nostril as directed [provider]  Active Self, Pharmacy Records  Cholecalciferol (VITAMIN D3) 50 MCG (2000 UT) TABS [id number] Yes Take 2,000 Units by mouth daily. [provider]  Active Self, Pharmacy Records             denosumab  (PROLIA ) 60 MG/ML SOSY injection [id number] Yes 60 mg every 6 (six) months. [provider]  Active   fluticasone  (FLONASE ) 50 MCG/ACT nasal spray [id number] Yes Place 1 spray into both nostrils daily as needed for allergies. [provider]  Active Self, Pharmacy Records  levothyroxine (SYNTHROID) 75 MCG tablet [id number] Yes Take 75 mcg by mouth daily. [provider]  Active Self, Pharmacy Records  lidocaine  (LIDODERM ) 5 % [id number] Yes Place 1 patch onto the skin daily. Remove & Discard patch within 12 hours or as directed by MD Dasie Faden, MD  Active   loratadine (CLARITIN) 10 MG tablet [id number] Yes Take 10 mg by mouth at bedtime. [provider]  Active Self, Pharmacy  Records  methocarbamol  (ROBAXIN ) 500 MG tablet [id number] Yes Take 1 tablet (500 mg total) by mouth 2 (two) times daily. Dasie Faden, MD  Active   ofloxacin  (OCUFLOX ) 0.3 % ophthalmic solution [id number] Yes Place 1 drop into the right eye 4 (four) times daily. Early, Sara E, NP  Active   oxyCODONE -acetaminophen  (PERCOCET/ROXICET) 5-325 MG tablet [id number] Yes Take 1 tablet by mouth every 6 (six) hours as needed for severe pain (pain score 7-10). Dasie Faden, MD  Active   pantoprazole  (PROTONIX ) 40 MG tablet [id number] Yes TAKE 1 TABLET(40 MG) BY MOUTH DAILY Early, Sara E, NP  Active   sertraline  (ZOLOFT ) 50 MG tablet [id number] Yes Take 1 tablet (50 mg total) by mouth at bedtime. Early, Sara E, NP  Active Self, Pharmacy Records  tretinoin (RETIN-A) 0.05 % cream [id number] Not taking  Apply 1 Application topically at bedtime as needed. Apply pea sized amount to affected area of face nightly as needed.  Patient not taking: Reported on 04/20/2024   [provider]  Active Self, Pharmacy Records                       Recommendation:   PCP Follow-up Continue Current Plan of Care Take medications as prescribed Call LCSW as needed for SW support at [phone] Attend all scheduled medical appointments  Follow Up Plan:   Telephone follow up appointment date/time:  06/01/2024 at 10:30 AM    Glendia Frances  MSW, LCSW [facility] Licensed Clinical Social Worker Direct Dial:  [phone] Fax:  [phone] Website:  [email or web address]

## 2024-04-20 NOTE — Patient Instructions (Signed)
 Visit Information  Thank you for taking time to visit with me today. Please don't hesitate to contact me if I can be of assistance to you before our next scheduled appointment.  Our next appointment is by telephone on 06/01/2024 at 10:30 AM   Please call the care guide team at [phone] if you need to cancel or reschedule your appointment.   Following is a copy of your care plan:   Goals Addressed             This Visit's Progress    VBCI Social Work Care Plan       Problems:   Anxiety issue (GAD)            Some stress in managing medical needs  CSW Clinical Goal(s):   Over the next 30  days the Patient will attend all scheduled medical appointments as evidenced by patient report and care team review of appointment completion in electronic medical record.               Over the next 30 days, the patient will use coping skills of choice to help her manage anxiety issues faced AEB patient report of decrease in anxiety issues faced  Interventions:  Talked with client about current client needs            Discussed medication procurement             Discussed family support with her spouse.              Discussed transport needs. She drives her car as needed to appointments and to complete errands              Discussed mood issues. Completed GAD-7 and PHQ 2/9              Completed assessments as needed               Reviewed program support with RN, Pharmacist, LCSW              Discussed pain issues. She has recently had back pain issues              Discussed client support with NP Camie BRAVO Early             Discussed client insurance [facility])             Discussed vision needs. She wears glasses periodically             Discussed social activities. She enjoys speaking via phone with her friends             Thanked client for phone call with LCSW               Encouraged client to call LCSW as needed at [phone]  Patient Goals/Self-Care Activities:  Take medications  as prescribed             Attend scheduled medical appointments             Call LCSW as needed for SW support              Follow current care plan             Attend appointments as scheduled with NP , Camie BRAVO Early  Plan:   Telephone follow up appointment with care management team member scheduled for:  06/01/2024 at 10:30 AM         Please go to [facility]  Urgent Care [address] [place] [phone]) if you are experiencing a Mental Health or Behavioral Health Crisis or need someone to talk to.  The patient verbalized understanding of instructions, educational materials, and care plan provided today and DECLINED offer to receive copy of patient instructions, educational materials, and care plan.    Glendia Pear  MSW, LCSW [facility] Licensed Clinical Social Worker Direct Dial:  [phone] Fax:  [phone] Website:  [email or web address]

## 2024-04-23 ENCOUNTER — Encounter: Payer: Self-pay | Admitting: Nurse Practitioner

## 2024-04-23 ENCOUNTER — Ambulatory Visit: Admitting: Nurse Practitioner

## 2024-04-23 VITALS — BP 124/80 | HR 94 | Wt 166.4 lb

## 2024-04-23 DIAGNOSIS — M150 Primary generalized (osteo)arthritis: Secondary | ICD-10-CM | POA: Diagnosis not present

## 2024-04-23 DIAGNOSIS — M810 Age-related osteoporosis without current pathological fracture: Secondary | ICD-10-CM | POA: Diagnosis not present

## 2024-04-23 DIAGNOSIS — M546 Pain in thoracic spine: Secondary | ICD-10-CM

## 2024-04-23 DIAGNOSIS — K2100 Gastro-esophageal reflux disease with esophagitis, without bleeding: Secondary | ICD-10-CM

## 2024-04-23 DIAGNOSIS — F321 Major depressive disorder, single episode, moderate: Secondary | ICD-10-CM

## 2024-04-23 MED ORDER — MELOXICAM 7.5 MG PO TABS: ORAL_TABLET | ORAL | 2 refills | Status: DC

## 2024-04-23 MED ORDER — METHOCARBAMOL 500 MG PO TABS: ORAL_TABLET | ORAL | 1 refills | Status: AC

## 2024-04-23 NOTE — Progress Notes (Signed)
 Camie FORBES Doing, DNP, AGNP-c [facility] [address] [place], [place] [address] [phone]   ACUTE VISIT on 04/23/2024  Blood pressure 124/80, pulse 94, weight 166 lb 6.4 oz (75.5 kg), last menstrual period 07/30/1980.  Subjective:  HPI  History of Present Illness Heather Brown is a 78 year old female who presents for a hospital f/u.  She experienced a severe back spasm that required ambulance transport due to immobility and intense pain, described as 'like somebody was putting a knife in me'. She was administered narcotics for pain relief, which she initially resisted but eventually accepted due to the severity of the pain. She also uses Robaxin  500 mg as needed for muscle relaxation, particularly at bedtime.  She takes Centrum 50 plus vitamin and Citracal plus D3 for calcium supplementation, totaling 1500 mg of calcium daily. She inquires about the potential for these supplements to increase her calcium levels.  She uses pantoprazole  for heartburn, which she finds effective. She expresses concern about its potential impact on bone health, given her history of weak bones, and has reduced her intake to once a week.  She recounts a recent fall in her garage, resulting in a significant bruise on her hip and subsequent soreness. This incident preceded the back spasm, which she describes as more painful than childbirth. She experiences occasional cramping in her legs after sitting and is attempting to resume walking for 30 minutes daily.  She takes aspirin for a blood condition and inquires about the compatibility of Tylenol  or Advil  with her condition. She also uses Voltaren gel occasionally for arthritis pain.  She enjoys music, attending concerts, and traveling with her husband. She recently visited the beach and plans to visit a lake soon. She wants to remain active despite her health challenges.   ROS negative except for what is listed in HPI. History,  Medications, Surgery, SDOH, and Family History reviewed and updated as appropriate.  Objective:  Physical Exam Vitals and nursing note reviewed.  Constitutional:      Appearance: Normal appearance.  HENT:     Head: Normocephalic.  Eyes:     Pupils: Pupils are equal, round, and reactive to light.  Cardiovascular:     Rate and Rhythm: Normal rate and regular rhythm.     Pulses: Normal pulses.     Heart sounds: Normal heart sounds.  Pulmonary:     Effort: Pulmonary effort is normal.     Breath sounds: Normal breath sounds.  Musculoskeletal:        General: Normal range of motion.     Cervical back: Normal range of motion.     Right lower leg: No edema.     Left lower leg: No edema.  Skin:    General: Skin is warm and dry.  Neurological:     General: No focal deficit present.     Mental Status: She is alert and oriented to person, place, and time.  Psychiatric:        Mood and Affect: Mood normal.         Assessment & Plan:   Problem List Items Addressed This Visit     Age-related osteoporosis without current pathological fracture   Osteoporosis with concern about potential increased fracture risk due to pantoprazole  use. Discussed the impact of pantoprazole  on calcium absorption and strategies to mitigate this risk. - Consider taking pantoprazole  every other day or at a different time from calcium and vitamin D  supplements - Monitor bone health and fracture risk  Depression, major, single episode, moderate (HCC)   Well controlled at this time with no alarm symptoms. WIll plan to continue current treatment and close monitoring.       Gastroesophageal reflux disease with esophagitis   GERD well-controlled with pantoprazole . Discussed potential side effects of long-term use, including impact on bone health. She has reduced frequency of pantoprazole  use due to concerns about bone health. - Use pantoprazole  as needed for GERD symptoms - Consider taking pantoprazole  every  other day or at a different time from calcium and vitamin D  supplements      Acute right-sided thoracic back pain - Primary   Acute low back pain with muscle spasm following a fall in the garage. Pain was severe, requiring narcotic pain relief initially. Currently experiencing intermittent pain, especially when getting up after sitting. - Use Robaxin  500 mg at bedtime as needed for muscle spasms - Encourage gentle stretching exercises - Prescribe meloxicam  for pain and swelling, to be taken once daily as needed      Relevant Medications   methocarbamol  (ROBAXIN ) 500 MG tablet   meloxicam  (MOBIC ) 7.5 MG tablet   Primary osteoarthritis involving multiple joints   Osteoarthritis with intermittent pain and swelling. Discussed use of topical and oral medications for symptom management. - Prescribe meloxicam  for pain and swelling, to be taken once daily as needed      Relevant Medications   methocarbamol  (ROBAXIN ) 500 MG tablet   meloxicam  (MOBIC ) 7.5 MG tablet    Camie FORBES Doing, DNP, AGNP-c

## 2024-04-28 ENCOUNTER — Encounter: Payer: Self-pay | Admitting: Nurse Practitioner

## 2024-04-28 DIAGNOSIS — M150 Primary generalized (osteo)arthritis: Secondary | ICD-10-CM | POA: Insufficient documentation

## 2024-04-28 DIAGNOSIS — M546 Pain in thoracic spine: Secondary | ICD-10-CM | POA: Insufficient documentation

## 2024-04-28 NOTE — Assessment & Plan Note (Signed)
 Osteoarthritis with intermittent pain and swelling. Discussed use of topical and oral medications for symptom management. - Prescribe meloxicam  for pain and swelling, to be taken once daily as needed

## 2024-04-28 NOTE — Assessment & Plan Note (Signed)
 Acute low back pain with muscle spasm following a fall in the garage. Pain was severe, requiring narcotic pain relief initially. Currently experiencing intermittent pain, especially when getting up after sitting. - Use Robaxin  500 mg at bedtime as needed for muscle spasms - Encourage gentle stretching exercises - Prescribe meloxicam  for pain and swelling, to be taken once daily as needed

## 2024-04-28 NOTE — Assessment & Plan Note (Signed)
 Osteoporosis with concern about potential increased fracture risk due to pantoprazole  use. Discussed the impact of pantoprazole  on calcium absorption and strategies to mitigate this risk. - Consider taking pantoprazole  every other day or at a different time from calcium and vitamin D  supplements - Monitor bone health and fracture risk

## 2024-04-28 NOTE — Assessment & Plan Note (Signed)
 Well controlled at this time with no alarm symptoms. WIll plan to continue current treatment and close monitoring.

## 2024-04-28 NOTE — Assessment & Plan Note (Signed)
 GERD well-controlled with pantoprazole . Discussed potential side effects of long-term use, including impact on bone health. She has reduced frequency of pantoprazole  use due to concerns about bone health. - Use pantoprazole  as needed for GERD symptoms - Consider taking pantoprazole  every other day or at a different time from calcium and vitamin D  supplements

## 2024-05-04 ENCOUNTER — Encounter: Payer: Self-pay | Admitting: Hematology & Oncology

## 2024-05-04 ENCOUNTER — Inpatient Hospital Stay: Attending: Hematology & Oncology

## 2024-05-04 ENCOUNTER — Inpatient Hospital Stay: Admitting: Hematology & Oncology

## 2024-05-04 VITALS — BP 131/76 | HR 80 | Temp 98.2°F | Resp 18 | Ht 61.0 in | Wt 166.1 lb

## 2024-05-04 DIAGNOSIS — E611 Iron deficiency: Secondary | ICD-10-CM | POA: Insufficient documentation

## 2024-05-04 DIAGNOSIS — Z7982 Long term (current) use of aspirin: Secondary | ICD-10-CM | POA: Insufficient documentation

## 2024-05-04 DIAGNOSIS — D45 Polycythemia vera: Secondary | ICD-10-CM | POA: Diagnosis not present

## 2024-05-04 DIAGNOSIS — K862 Cyst of pancreas: Secondary | ICD-10-CM

## 2024-05-04 LAB — IRON AND IRON BINDING CAPACITY (CC-WL,HP ONLY)
Iron: 41 ug/dL (ref 28–170)
Saturation Ratios: 8 % — ABNORMAL LOW (ref 10.4–31.8)
TIBC: 497 ug/dL — ABNORMAL HIGH (ref 250–450)
UIBC: 456 ug/dL

## 2024-05-04 LAB — CBC WITH DIFFERENTIAL (CANCER CENTER ONLY)
Abs Immature Granulocytes: 0.01 K/uL (ref 0.00–0.07)
Basophils Absolute: 0.1 K/uL (ref 0.0–0.1)
Basophils Relative: 1 %
Eosinophils Absolute: 0.3 K/uL (ref 0.0–0.5)
Eosinophils Relative: 6 %
HCT: 39.2 % (ref 36.0–46.0)
Hemoglobin: 11.8 g/dL — ABNORMAL LOW (ref 12.0–15.0)
Immature Granulocytes: 0 %
Lymphocytes Relative: 31 %
Lymphs Abs: 1.5 K/uL (ref 0.7–4.0)
MCH: 21.8 pg — ABNORMAL LOW (ref 26.0–34.0)
MCHC: 30.1 g/dL (ref 30.0–36.0)
MCV: 72.5 fL — ABNORMAL LOW (ref 80.0–100.0)
Monocytes Absolute: 0.6 K/uL (ref 0.1–1.0)
Monocytes Relative: 12 %
Neutro Abs: 2.4 K/uL (ref 1.7–7.7)
Neutrophils Relative %: 50 %
Platelet Count: 267 K/uL (ref 150–400)
RBC: 5.41 MIL/uL — ABNORMAL HIGH (ref 3.87–5.11)
RDW: 19 % — ABNORMAL HIGH (ref 11.5–15.5)
WBC Count: 4.9 K/uL (ref 4.0–10.5)
nRBC: 0 % (ref 0.0–0.2)

## 2024-05-04 LAB — CMP (CANCER CENTER ONLY)
ALT: 21 U/L (ref 0–44)
AST: 23 U/L (ref 15–41)
Albumin: 4.5 g/dL (ref 3.5–5.0)
Alkaline Phosphatase: 84 U/L (ref 38–126)
Anion gap: 11 (ref 5–15)
BUN: 19 mg/dL (ref 8–23)
CO2: 25 mmol/L (ref 22–32)
Calcium: 9.1 mg/dL (ref 8.9–10.3)
Chloride: 108 mmol/L (ref 98–111)
Creatinine: 0.72 mg/dL (ref 0.44–1.00)
GFR, Estimated: >60 mL/min (ref >60)
Glucose, Bld: 100 mg/dL — ABNORMAL HIGH (ref 70–99)
Potassium: 4.3 mmol/L (ref 3.5–5.1)
Sodium: 144 mmol/L (ref 135–145)
Total Bilirubin: 0.8 mg/dL (ref 0.0–1.2)
Total Protein: 7.4 g/dL (ref 6.5–8.1)

## 2024-05-04 LAB — FERRITIN: Ferritin: 14 ng/mL (ref 11–307)

## 2024-05-04 NOTE — Progress Notes (Signed)
 Hematology and Oncology Follow Up Visit  Olita Takeshita [id number] 1945/09/26 78 y.o. 05/04/2024   Principle Diagnosis:  Polycythemia vera -- DNMT3A (+),  Epo = 5.7  Current Therapy:   Phlebotomy to maintain hematocrit below 45% Aspirin 81 mg p.o. daily     Interim History:  Ms. Olvera is back for follow-up.  She and her husband had a very nice time up in [place].  They are on a cruise.  I am so happy that they can enjoy themselves.  She recently fell.  She was in the garage.  She went to the ER.  X-rays were done which were unremarkable for any kind of fractures or compressions..  She is feeling better right now.  Her husband is having his health issues.  She does have low iron.  When we saw her back in June, her iron saturation was 9%.  Her ferritin was 11.  Again, we may have to watch her iron levels and actually have to give her some iron back.  She apparently had an MRI of the abdomen in July.  This was to assess for the pancreatic cyst.  This has not changed at all.  The size was 2.7 x 1.8 cm.  There is nothing that looked suspicious for malignancy.  It was recommended that another MRI be done in 6 months.  She has had no problems with COVID.  There is been no change in bowel or bladder habits.  She does not urinate quite a bit because she chews ice.  Overall, I would say that her performance status is probably ECOG 1.      Medications:  Current Outpatient Medications:    aspirin 81 MG tablet, Take 81 mg by mouth at bedtime., Disp: , Rfl:    azelastine  (ASTELIN ) 0.1 % nasal spray, Place 1 spray into both nostrils 2 (two) times daily as needed for rhinitis. Use in each nostril as directed, Disp: , Rfl:    Cholecalciferol (VITAMIN D3) 50 MCG (2000 UT) TABS, Take 2,000 Units by mouth daily., Disp: , Rfl:    denosumab  (PROLIA ) 60 MG/ML SOSY injection, 60 mg every 6 (six) months., Disp: , Rfl:    fluticasone  (FLONASE ) 50 MCG/ACT nasal spray, Place 1 spray into both  nostrils daily as needed for allergies., Disp: , Rfl:    levothyroxine (SYNTHROID) 75 MCG tablet, Take 75 mcg by mouth daily., Disp: , Rfl:    lidocaine  (LIDODERM ) 5 %, Place 1 patch onto the skin daily. Remove & Discard patch within 12 hours or as directed by MD, Disp: 30 patch, Rfl: 0   loratadine (CLARITIN) 10 MG tablet, Take 10 mg by mouth at bedtime., Disp: , Rfl:    meloxicam  (MOBIC ) 7.5 MG tablet, Take 1-2 tablets by mouth once a day as needed for muscle or joint pain and swelling., Disp: 30 tablet, Rfl: 2   methocarbamol  (ROBAXIN ) 500 MG tablet, Take 1 tablet by mouth up to two times a day as needed for muscle spasms. Will make you sleepy., Disp: 30 tablet, Rfl: 1   ofloxacin  (OCUFLOX ) 0.3 % ophthalmic solution, Place 1 drop into the right eye 4 (four) times daily. (Patient taking differently: Place 1 drop into the right eye 4 (four) times daily.), Disp: 5 mL, Rfl: 0   pantoprazole  (PROTONIX ) 40 MG tablet, TAKE 1 TABLET(40 MG) BY MOUTH DAILY, Disp: 90 tablet, Rfl: 1   sertraline  (ZOLOFT ) 50 MG tablet, Take 1 tablet (50 mg total) by mouth at bedtime., Disp: 90  tablet, Rfl: 3   triamcinolone  cream (KENALOG ) 0.1 %, Apply 1 Application topically 2 (two) times daily as needed., Disp: , Rfl:    tretinoin (RETIN-A) 0.05 % cream, Apply 1 Application topically at bedtime as needed. Apply pea sized amount to affected area of face nightly as needed. (Patient not taking: Reported on 05/04/2024), Disp: , Rfl: 5  Allergies:  Allergies  Allergen Reactions   Sulfa Antibiotics Hives   Cephalexin  Rash    Face turned bright red.   Diazepam Other (See Comments)    Doesn't like the way it makes her feel.    Past Medical History, Surgical history, Social history, and Family History were reviewed and updated.  Review of Systems: Review of Systems  HENT:  Negative.    Eyes: Negative.   Respiratory:  Positive for shortness of breath.   Cardiovascular: Negative.   Gastrointestinal:  Positive for abdominal  distention.  Endocrine: Negative.   Musculoskeletal:  Positive for arthralgias, flank pain, myalgias and neck stiffness.  Skin: Negative.   Neurological: Negative.   Hematological: Negative.   Psychiatric/Behavioral: Negative.      Physical Exam:  Vital signs are temperature 98.2.  Pulse 80.  Blood pressure 131/76.  Weight is 166 pounds.   Wt Readings from Last 3 Encounters:  05/04/24 166 lb 1.3 oz (75.3 kg)  04/23/24 166 lb 6.4 oz (75.5 kg)  02/27/24 166 lb 6.4 oz (75.5 kg)    Physical Exam Vitals reviewed.  HENT:     Head: Normocephalic and atraumatic.  Eyes:     Pupils: Pupils are equal, round, and reactive to light.  Cardiovascular:     Rate and Rhythm: Normal rate and regular rhythm.     Heart sounds: Normal heart sounds.  Pulmonary:     Effort: Pulmonary effort is normal.     Breath sounds: Normal breath sounds.  Abdominal:     General: Bowel sounds are normal.     Palpations: Abdomen is soft.  Musculoskeletal:        General: No tenderness or deformity. Normal range of motion.     Cervical back: Normal range of motion.     Comments: She has a splint on the right forearm.  There is no swelling.  She has some limited range of motion of the thumb.  Lymphadenopathy:     Cervical: No cervical adenopathy.  Skin:    General: Skin is warm and dry.     Findings: No erythema or rash.  Neurological:     Mental Status: She is alert and oriented to person, place, and time.  Psychiatric:        Behavior: Behavior normal.        Thought Content: Thought content normal.        Judgment: Judgment normal.      Lab Results  Component Value Date   WBC 4.9 05/04/2024   HGB 11.8 (L) 05/04/2024   HCT 39.2 05/04/2024   MCV 72.5 (L) 05/04/2024   PLT 267 05/04/2024     Chemistry      Component Value Date/Time   NA 144 05/04/2024 1227   NA 141 02/27/2024 1443   K 4.3 05/04/2024 1227   CL 108 05/04/2024 1227   CO2 25 05/04/2024 1227   BUN 19 05/04/2024 1227   BUN 20  02/27/2024 1443   CREATININE 0.72 05/04/2024 1227   CREATININE 0.79 03/10/2014 0836      Component Value Date/Time   CALCIUM 9.1 05/04/2024 1227   ALKPHOS  84 05/04/2024 1227   AST 23 05/04/2024 1227   ALT 21 05/04/2024 1227   BILITOT 0.8 05/04/2024 1227       Impression and Plan: Ms. Lugar is a 78 year old white female.  She has polycythemia vera.  So far, she is done very nicely.  Her iron levels are low so we really have not had to phlebotomize her.  She is taking baby aspirin which I think is a very good idea.  We will have to see what her iron level is.  We actually may have to think about giving her some IV iron back.  Again we will have to see what the studies look like.  I think that we can get her through the Holiday season.  I probably will plan to see her back next year.    Maude JONELLE Crease, MD 10/6/20251:35 PM

## 2024-05-05 ENCOUNTER — Other Ambulatory Visit: Payer: Self-pay | Admitting: Family

## 2024-05-05 ENCOUNTER — Ambulatory Visit: Payer: Self-pay | Admitting: Hematology & Oncology

## 2024-05-11 ENCOUNTER — Other Ambulatory Visit (HOSPITAL_BASED_OUTPATIENT_CLINIC_OR_DEPARTMENT_OTHER): Payer: Self-pay | Admitting: Nurse Practitioner

## 2024-05-13 DIAGNOSIS — K080 Exfoliation of teeth due to systemic causes: Secondary | ICD-10-CM | POA: Diagnosis not present

## 2024-05-19 ENCOUNTER — Inpatient Hospital Stay

## 2024-05-19 VITALS — BP 123/73 | HR 70 | Temp 98.0°F | Resp 18

## 2024-05-19 DIAGNOSIS — D45 Polycythemia vera: Secondary | ICD-10-CM

## 2024-05-19 DIAGNOSIS — E611 Iron deficiency: Secondary | ICD-10-CM | POA: Diagnosis not present

## 2024-05-19 MED ORDER — SODIUM CHLORIDE 0.9 % IV SOLN: INTRAVENOUS | Status: DC

## 2024-05-19 MED ORDER — SODIUM CHLORIDE 0.9% FLUSH: 10.0000 mL | Freq: Once | INTRAVENOUS | Status: DC | PRN

## 2024-05-19 MED ORDER — SODIUM CHLORIDE 0.9 % IV SOLN
510.0000 mg | Freq: Once | INTRAVENOUS | Status: AC
  Administered 2024-05-19: 510 mg via INTRAVENOUS
  Filled 2024-05-19: qty 17

## 2024-05-19 MED ORDER — SODIUM CHLORIDE 0.9% FLUSH: 3.0000 mL | Freq: Once | INTRAVENOUS | Status: DC | PRN

## 2024-05-19 NOTE — Patient Instructions (Signed)

## 2024-05-26 ENCOUNTER — Inpatient Hospital Stay

## 2024-05-26 VITALS — BP 134/59 | HR 70 | Temp 97.8°F | Resp 18

## 2024-05-26 DIAGNOSIS — D45 Polycythemia vera: Secondary | ICD-10-CM

## 2024-05-26 DIAGNOSIS — E611 Iron deficiency: Secondary | ICD-10-CM | POA: Diagnosis not present

## 2024-05-26 MED ORDER — SODIUM CHLORIDE 0.9 % IV SOLN
510.0000 mg | Freq: Once | INTRAVENOUS | Status: AC
  Administered 2024-05-26: 510 mg via INTRAVENOUS
  Filled 2024-05-26: qty 17

## 2024-05-26 MED ORDER — SODIUM CHLORIDE 0.9 % IV SOLN: INTRAVENOUS | Status: DC

## 2024-05-26 NOTE — Patient Instructions (Signed)

## 2024-06-01 ENCOUNTER — Other Ambulatory Visit: Payer: Self-pay | Admitting: Licensed Clinical Social Worker

## 2024-06-01 NOTE — Patient Instructions (Signed)
 Visit Information  Thank you for taking time to visit with me today. Please don't hesitate to contact me if I can be of assistance to you before our next scheduled appointment.  Our next appointment is by telephone on 07/08/24 at 10:30 AM   Please call the care guide team at [phone] if you need to cancel or reschedule your appointment.   Following is a copy of your care plan:   Goals Addressed             This Visit's Progress    VBCI Social Work Care Plan       Problems:   Anxiety issue (GAD)            Some stress in managing medical needs  CSW Clinical Goal(s):   Over the next 30  days the Patient will attend all scheduled medical appointments as evidenced by patient report and care team review of appointment completion in electronic medical record.               Over the next 30 days, the patient will use coping skills of choice to help her manage anxiety issues faced AEB patient report of decrease in anxiety issues faced  Interventions:  Talked with client about current client needs            Discussed medication procurement            Discussed transport needs. She drives her car as needed to appointments and to complete errands              Discussed mood issues. Completed GAD-7 and PHQ 2/9              Completed assessments as needed              Discussed pain issues of client              Discussed support of NP Camie BRAVO Early              Reviewed program support with RN, Pharmacist, LCSW              Discussed client insurance [facility])             Discussed vision needs. She wears glasses periodically             Discussed social activities. She enjoys speaking via phone with her friends. Client enjoys social activities with her friends.              Thanked client for phone call with LCSW               Encouraged client to call LCSW as needed at [phone]  Patient Goals/Self-Care Activities:  Take medications as prescribed             Attend scheduled  medical appointments             Call LCSW as needed for SW support              Follow current care plan             Attend appointments as scheduled with NP , Camie BRAVO Early  Plan:  Telephone follow up appointment with care management team member scheduled for:  12/10 /25 at 10:30 AM         Please go to [facility] [address], [place] [phone]) if you are experiencing a Mental Health or Behavioral Health Crisis  or need someone to talk to.  Patient verbalized understanding of Care plan and visit instructions communicated this visit   Glendia Pear  MSW, LCSW [place]/Value Based Care [facility] Licensed Clinical Social Worker Direct Dial:  [phone] Fax:  [phone] Website:  [email or web address]

## 2024-06-01 NOTE — Patient Outreach (Signed)
 Complex Care Management   Visit Note  06/01/2024  Name:  Heather Brown MRN: [id number] DOB: February 03, 1946  Situation: Referral received for Complex Care Management related to mental health needs: GAD I obtained verbal consent from Patient.  Visit completed with Patient  on the phone  Background:   Past Medical History:  Diagnosis Date   ACE-inhibitor cough    Acute bacterial conjunctivitis of left eye 02/28/2024   Acute cystitis with hematuria 07/11/2022   Anemia    Anxiety    Arthritis    COVID-19 06/26/2022   Depression    Dyspareunia    dryness with intercourse   Endometriosis 1982   found at [facility]   Fluid level behind tympanic membrane of both ears 03/02/2021   GERD (gastroesophageal reflux disease)    Heart murmur    History of kidney stones    History of left shoulder fracture 2017   Hypertension    Hypothyroidism    Impacted cerumen of both ears 10/03/2022   Left ACL tear    Osteopenia 2009   Pneumonia    Polycythemia vera (HCC) 12/02/2017   PONV (postoperative nausea and vomiting)    Rhinorrhea 10/09/2021   Right hand pain 11/05/2023   Subacute cough 09/11/2021   Thyroid  nodule    followed by Dr Tommas   Urinary incontinence    with coughing/sneezing    Assessment: Patient Reported Symptoms:  Cognitive Cognitive Status: Alert and oriented to person, place, and time Cognitive/Intellectual Conditions Management [RPT]: None reported or documented in medical history or problem list   Health Maintenance Behaviors: Annual physical exam, Sleep adequate, Hobbies Health Facilitated by: Stress management  Neurological Neurological Review of Symptoms: Vision changes, Weakness Neurological Management Strategies: Adequate rest  HEENT HEENT Symptoms Reported: No symptoms reported HEENT Management Strategies: Coping strategies    Cardiovascular Cardiovascular Symptoms Reported: Fatigue Cardiovascular Management Strategies: Adequate rest, Coping strategies   Respiratory Respiratory Symptoms Reported: No symptoms reported Respiratory Management Strategies: Coping strategies  Endocrine Endocrine Symptoms Reported: Weakness or fatigue    Gastrointestinal Gastrointestinal Symptoms Reported: No symptoms reported Additional Gastrointestinal Details: GERD Gastrointestinal Management Strategies: Adequate rest    Genitourinary Genitourinary Symptoms Reported: No symptoms reported Genitourinary Management Strategies: Adequate rest, Coping strategies  Integumentary Integumentary Symptoms Reported: No symptoms reported Skin Management Strategies: Adequate rest  Musculoskeletal Musculoskelatal Symptoms Reviewed: Muscle pain, Back pain Musculoskeletal Management Strategies: Coping strategies, Adequate rest      Psychosocial Psychosocial Symptoms Reported: Anxiety - if selected complete GAD Additional Psychological Details: GAD diagnosis Behavioral Management Strategies: Coping strategies Major Change/Loss/Stressor/Fears (CP): Medical condition, self Techniques to Cope with Loss/Stress/Change: Counseling, Diversional activities, Exercise Quality of Family Relationships: supportive Do you feel physically threatened by others?: No    06/01/2024    PHQ2-9 Depression Screening   Little interest or pleasure in doing things Not at all  Feeling down, depressed, or hopeless Not at all  PHQ-2 - Total Score 0  Trouble falling or staying asleep, or sleeping too much Not at all  Feeling tired or having little energy Not at all  Poor appetite or overeating  Not at all  Feeling bad about yourself - or that you are a failure or have let yourself or your family down Not at all  Trouble concentrating on things, such as reading the newspaper or watching television Not at all  Moving or speaking so slowly that other people could have noticed.  Or the opposite - being so fidgety or restless that you have been moving  around a lot more than usual Not at all  Thoughts  that you would be better off dead, or hurting yourself in some way Not at all  PHQ2-9 Total Score 0  If you checked off any problems, how difficult have these problems made it for you to do your work, take care of things at home, or get along with other people Not difficult at all  Depression Interventions/Treatment Counseling    Vitals:  No BP problems mentioned by client during call with LCSW today  Medications Reviewed Today   Medications were not reviewed in this encounter     Recommendation:   PCP Follow-up Continue Current Plan of Care Call LCSW as needed for SW support Involve in stress reduction activities of choice to help manage anxiety issues  Follow Up Plan:   Telephone follow up appointment date/time:  07/08/24 at 10:30 AM    Glendia Pear  MSW, LCSW [place]/Value Based Care [facility] Licensed Clinical Social Worker Direct Dial:  [phone] Fax:  [phone] Website:  [email or web address]

## 2024-07-01 DIAGNOSIS — E210 Primary hyperparathyroidism: Secondary | ICD-10-CM | POA: Diagnosis not present

## 2024-07-01 DIAGNOSIS — H5212 Myopia, left eye: Secondary | ICD-10-CM | POA: Diagnosis not present

## 2024-07-01 DIAGNOSIS — M810 Age-related osteoporosis without current pathological fracture: Secondary | ICD-10-CM | POA: Diagnosis not present

## 2024-07-02 ENCOUNTER — Ambulatory Visit: Payer: Self-pay

## 2024-07-02 ENCOUNTER — Encounter: Payer: Self-pay | Admitting: Family Medicine

## 2024-07-02 ENCOUNTER — Ambulatory Visit: Admitting: Family Medicine

## 2024-07-02 VITALS — BP 124/72 | HR 84 | Wt 168.8 lb

## 2024-07-02 DIAGNOSIS — H6123 Impacted cerumen, bilateral: Secondary | ICD-10-CM | POA: Diagnosis not present

## 2024-07-02 DIAGNOSIS — R42 Dizziness and giddiness: Secondary | ICD-10-CM | POA: Diagnosis not present

## 2024-07-02 DIAGNOSIS — M1811 Unilateral primary osteoarthritis of first carpometacarpal joint, right hand: Secondary | ICD-10-CM

## 2024-07-02 MED ORDER — MELOXICAM 15 MG PO TABS: 15.0000 mg | ORAL_TABLET | Freq: Every day | ORAL | 0 refills | Status: DC

## 2024-07-02 NOTE — Telephone Encounter (Signed)
 FYI Only or Action Required?: FYI only for provider: appointment scheduled on 12.4.25.  Patient was last seen in primary care on 04/23/2024 by Early, Camie BRAVO, NP.  Called Nurse Triage reporting Dizziness.  Symptoms began several weeks ago.  Interventions attempted: Nothing.  Symptoms are: unchanged.  Triage Disposition: See Physician Within 24 Hours  Patient/caregiver understands and will follow disposition?: Yes     Copied from CRM #[id number]. Topic: Clinical - Red Word Triage >> Jul 02, 2024 12:02 PM Heather Brown wrote: Red Word that prompted transfer to Nurse Triage: Pt called in to schedule app to have wax removed from ears as she is getting hearing aids soon. Pt is expierncing dizzyness with equilibrium. Pt states anytime she stands up, sit down, move around the dizzyness begins and slowly waivers away. Transferred to NT Reason for Disposition  [1] NO dizziness now AND [2] one or more stroke risk factors (i.e., hypertension, diabetes, prior stroke/TIA/heart attack)  Answer Assessment - Initial Assessment Questions Pt called bc she needs to have her ears washed out for her new hearing aids and said while I'm there, I'd like to talk to Camie Hun about my dizziness.  Dizziness with laying, or standing, feels like her head is swarming and needs to sit down. Has been happening more frequently. Some other things she mentioned, stiff neck from her pillow and one day last week had a round of vomiting and diarrhea, none since.     1. DESCRIPTION: Describe your dizziness.     Head swarming 2. VERTIGO: Do you feel like either you or the room is spinning or tilting?      yes 3. LIGHTHEADED: Do you feel lightheaded? (e.g., somewhat faint, woozy, weak upon standing)     denies 4. SEVERITY: How bad is it?  Can you walk?     She can walk 5. ONSET:  When did the dizziness begin?      A couple of weeks ago 6. AGGRAVATING FACTORS: Does anything make it worse? (e.g., standing, change  in head position)     Laying down, or changing head positions 7. CAUSE: What do you think is causing the dizziness?     unknown 8. RECURRENT SYMPTOM: Have you had dizziness before? If Yes, ask: When was the last time? What happened that time?     no 9. OTHER SYMPTOMS: Do you have any other symptoms? (e.g., earache, headache, numbness, tinnitus, vomiting, weakness)     Vomiting diarrhea x 1  Protocols used: Dizziness - Vertigo-A-AH

## 2024-07-03 NOTE — Progress Notes (Signed)
 Name: Brya Simerly   Date of Visit: 07/03/24   Date of last visit with me: Visit date not found   CHIEF COMPLAINT:  Chief Complaint  Patient presents with   Acute Visit    Dizziness, needs ears flushed.       HPI:  Discussed the use of AI scribe software for clinical note transcription with the patient, who gave verbal consent to proceed.  History of Present Illness   Keiana Tavella is a 78 year old female who presents with ear fullness and dizziness.  She experiences a sensation of fullness in her left ear, occasionally accompanied by a popping noise. There is no associated pain. She can sometimes alleviate the fullness by manipulating her ear.  She reports episodes of dizziness, particularly when turning quickly, standing up, or lying down. The dizziness is described as a sensation that makes her feel like she needs to stop moving. Similar symptoms have occurred in the past, but she does not recall the specific diagnosis. The dizziness occurs not only with head movements but also when standing up or turning.  She has a history of a previous fall that resulted in wrist pain. She describes ongoing discomfort in her wrist, particularly when engaging in activities such as raking, sweeping, or vacuuming. She recalls receiving a steroid injection in her toe in the past and is reluctant to undergo a similar procedure for her wrist. She has been prescribed meloxicam , which she has not yet used.  No issues with her thumb or fingers getting stuck, although she mentions having to work on closing her hand fully in the past.         OBJECTIVE:       06/01/2024   10:58 AM  Depression screen PHQ 2/9  Decreased Interest 0  Down, Depressed, Hopeless 0  PHQ - 2 Score 0  Altered sleeping 0  Tired, decreased energy 0  Change in appetite 0  Feeling bad or failure about yourself  0  Trouble concentrating 0  Moving slowly or fidgety/restless 0  Suicidal thoughts 0  PHQ-9  Score 0   Difficult doing work/chores Not difficult at all     Data saved with a previous flowsheet row definition     BP Readings from Last 3 Encounters:  07/02/24 124/72  05/26/24 (!) 134/59  05/19/24 123/73    BP 124/72   Pulse 84   Wt 168 lb 12.8 oz (76.6 kg)   LMP 07/30/1980   SpO2 98%   BMI 31.89 kg/m    Physical Exam   HEENT: Cerumen partially occluding ear canal      Physical Exam Constitutional:      Appearance: Normal appearance.  HENT:     Right Ear: Tympanic membrane, ear canal and external ear normal. There is impacted cerumen.     Left Ear: Tympanic membrane, ear canal and external ear normal. There is impacted cerumen.     Mouth/Throat:     Pharynx: No oropharyngeal exudate or posterior oropharyngeal erythema.  Neurological:     Mental Status: She is alert.     ASSESSMENT/PLAN:   Assessment & Plan Impacted cerumen of both ears  Orthostatic dizziness  Arthritis of carpometacarpal (CMC) joint of right thumb    Assessment and Plan    Impacted cerumen, left ear Impacted cerumen causing fullness and occasional popping sensation. - Performed ear irrigation. -Cerumen impaction noted bilaterally causing decreased hearing. Removal performed using suction and curette under direct visualization. Tympanic membranes visualized  post-procedure; patient tolerated well.  Dizziness, likely multifactorial (possible BPPV and orthostatic hypotension) Dizziness with rapid head movements and positional changes suggests BPPV and orthostatic hypotension. Viral infection considered in differential. - Advised slow positional changes. - Instructed to monitor symptoms for two weeks post ear irrigation. - Consider further testing for BPPV or orthostatic hypotension if dizziness persists.  Primary osteoarthritis of right first carpometacarpal joint Chronic pain in right first carpometacarpal joint. Swelling likely due to recent fall, underlying arthritis present. -  Prescribed meloxicam  once daily for five days with food. - Advised against concurrent use of ibuprofen  or other NSAIDs. - Consider steroid injection post-holidays if symptoms persist.      Cerumen impaction noted bilaterally causing decreased hearing. Removal performed using suction and curette under direct visualization. Tympanic membranes visualized post-procedure; patient tolerated well.    Irini Leet A. Vita MD [facility]

## 2024-07-06 DIAGNOSIS — M810 Age-related osteoporosis without current pathological fracture: Secondary | ICD-10-CM | POA: Diagnosis not present

## 2024-07-08 ENCOUNTER — Telehealth: Admitting: Licensed Clinical Social Worker

## 2024-07-19 ENCOUNTER — Other Ambulatory Visit: Payer: Self-pay | Admitting: Nurse Practitioner

## 2024-07-19 DIAGNOSIS — M546 Pain in thoracic spine: Secondary | ICD-10-CM

## 2024-07-27 ENCOUNTER — Inpatient Hospital Stay

## 2024-07-27 ENCOUNTER — Inpatient Hospital Stay: Admitting: Hematology & Oncology

## 2024-07-27 ENCOUNTER — Encounter: Payer: Self-pay | Admitting: Hematology & Oncology

## 2024-07-27 ENCOUNTER — Inpatient Hospital Stay: Attending: Hematology & Oncology

## 2024-07-27 VITALS — BP 124/61 | HR 70 | Resp 18

## 2024-07-27 VITALS — BP 135/80 | HR 75 | Temp 97.7°F | Resp 20 | Ht 61.0 in | Wt 167.8 lb

## 2024-07-27 DIAGNOSIS — D45 Polycythemia vera: Secondary | ICD-10-CM | POA: Diagnosis not present

## 2024-07-27 DIAGNOSIS — E611 Iron deficiency: Secondary | ICD-10-CM | POA: Insufficient documentation

## 2024-07-27 LAB — CBC WITH DIFFERENTIAL (CANCER CENTER ONLY)
Abs Immature Granulocytes: 0.01 K/uL (ref 0.00–0.07)
Basophils Absolute: 0.1 K/uL (ref 0.0–0.1)
Basophils Relative: 1 %
Eosinophils Absolute: 0.2 K/uL (ref 0.0–0.5)
Eosinophils Relative: 4 %
HCT: 47.8 % — ABNORMAL HIGH (ref 36.0–46.0)
Hemoglobin: 15.3 g/dL — ABNORMAL HIGH (ref 12.0–15.0)
Immature Granulocytes: 0 %
Lymphocytes Relative: 25 %
Lymphs Abs: 1.2 K/uL (ref 0.7–4.0)
MCH: 26.8 pg (ref 26.0–34.0)
MCHC: 32 g/dL (ref 30.0–36.0)
MCV: 83.7 fL (ref 80.0–100.0)
Monocytes Absolute: 0.5 K/uL (ref 0.1–1.0)
Monocytes Relative: 10 %
Neutro Abs: 2.8 K/uL (ref 1.7–7.7)
Neutrophils Relative %: 60 %
Platelet Count: 250 K/uL (ref 150–400)
RBC: 5.71 MIL/uL — ABNORMAL HIGH (ref 3.87–5.11)
RDW: 21.6 % — ABNORMAL HIGH (ref 11.5–15.5)
WBC Count: 4.7 K/uL (ref 4.0–10.5)
nRBC: 0 % (ref 0.0–0.2)

## 2024-07-27 LAB — CMP (CANCER CENTER ONLY)
ALT: 25 U/L (ref 0–44)
AST: 26 U/L (ref 15–41)
Albumin: 4.6 g/dL (ref 3.5–5.0)
Alkaline Phosphatase: 95 U/L (ref 38–126)
Anion gap: 12 (ref 5–15)
BUN: 16 mg/dL (ref 8–23)
CO2: 26 mmol/L (ref 22–32)
Calcium: 8.9 mg/dL (ref 8.9–10.3)
Chloride: 108 mmol/L (ref 98–111)
Creatinine: 0.81 mg/dL (ref 0.44–1.00)
GFR, Estimated: >60 mL/min
Glucose, Bld: 122 mg/dL — ABNORMAL HIGH (ref 70–99)
Potassium: 4.2 mmol/L (ref 3.5–5.1)
Sodium: 146 mmol/L — ABNORMAL HIGH (ref 135–145)
Total Bilirubin: 0.7 mg/dL (ref 0.0–1.2)
Total Protein: 7.5 g/dL (ref 6.5–8.1)

## 2024-07-27 LAB — IRON AND IRON BINDING CAPACITY (CC-WL,HP ONLY)
Iron: 77 ug/dL (ref 28–170)
Saturation Ratios: 23 % (ref 10.4–31.8)
TIBC: 329 ug/dL (ref 250–450)
UIBC: 252 ug/dL

## 2024-07-27 LAB — FERRITIN: Ferritin: 128 ng/mL (ref 11–307)

## 2024-07-27 MED ORDER — SODIUM CHLORIDE 0.9 % IV SOLN: INTRAVENOUS | Status: AC

## 2024-07-27 NOTE — Progress Notes (Signed)
 Heather Brown presents today for phlebotomy per MD orders. Phlebotomy procedure started at 1215 and ended at 1228. 500 grams removed. Pt received 500ml of saline per request.  Patient observed for 30 minutes after procedure without any incident. Patient tolerated procedure well. IV needle removed intact.

## 2024-07-27 NOTE — Patient Instructions (Signed)

## 2024-07-27 NOTE — Progress Notes (Signed)
 " Hematology and Oncology Follow Up Visit  Heather Brown [id number] May 21, 1946 78 y.o. 07/27/2024   Principle Diagnosis:  Polycythemia vera -- DNMT3A (+),  Epo = 5.7  Current Therapy:   Phlebotomy to maintain hematocrit below 45% Aspirin 81 mg p.o. daily     Interim History:  Heather Brown is back for follow-up.  She enjoyed the holidays.  She had a very nice Thanksgiving and Christmas.  [place]  We had she did have to give her iron back in October.  She just was not feeling well at all.  The iron certainly got her blood count up.  She got up low bit on the high side so we are probably going to have to phlebotomize her.  She has had some problems with falling.  Thankfully, she has not broken anything.  She has had no fever.  She has had no cough.  She has had no change in bowel or bladder habits.  She has had no rashes.  She has had no leg swelling.  There is been no headache..  She is going to have cataract surgery this upcoming Monday.  I think will be for the right eye.  Overall, I would have to say that her performance status is probably ECOG 1.    Medications:  Current Outpatient Medications:    aspirin 81 MG tablet, Take 81 mg by mouth at bedtime., Disp: , Rfl:    azelastine  (ASTELIN ) 0.1 % nasal spray, Place 1 spray into both nostrils 2 (two) times daily as needed for rhinitis. Use in each nostril as directed, Disp: , Rfl:    Cholecalciferol (VITAMIN D3) 50 MCG (2000 UT) TABS, Take 2,000 Units by mouth daily., Disp: , Rfl:    denosumab  (PROLIA ) 60 MG/ML SOSY injection, 60 mg every 6 (six) months., Disp: , Rfl:    fluticasone  (FLONASE ) 50 MCG/ACT nasal spray, Place 1 spray into both nostrils daily as needed for allergies., Disp: , Rfl:    levothyroxine (SYNTHROID) 75 MCG tablet, Take 75 mcg by mouth daily., Disp: , Rfl:    loratadine (CLARITIN) 10 MG tablet, Take 10 mg by mouth at bedtime., Disp: , Rfl:     methocarbamol  (ROBAXIN ) 500 MG tablet, Take 1 tablet by mouth up to two times a day as needed for muscle spasms. Will make you sleepy., Disp: 30 tablet, Rfl: 1   sertraline  (ZOLOFT ) 50 MG tablet, TAKE 1 TABLET(50 MG) BY MOUTH AT BEDTIME, Disp: 90 tablet, Rfl: 1   triamcinolone  cream (KENALOG ) 0.1 %, Apply 1 Application topically 2 (two) times daily as needed., Disp: , Rfl:    lidocaine  (LIDODERM ) 5 %, Place 1 patch onto the skin daily. Remove & Discard patch within 12 hours or as directed by MD (Patient not taking: Reported on 07/27/2024), Disp: 30 patch, Rfl: 0   meloxicam  (MOBIC ) 15 MG tablet, Take 1 tablet (15 mg total) by mouth daily. (Patient not taking: Reported on 07/27/2024), Disp: 30 tablet, Rfl: 0   ofloxacin  (OCUFLOX ) 0.3 % ophthalmic solution, Place 1 drop into the right eye 4 (four) times daily. (Patient not taking: Reported on 07/27/2024), Disp: 5 mL, Rfl: 0   pantoprazole  (PROTONIX ) 40 MG tablet, TAKE 1 TABLET(40 MG) BY MOUTH DAILY (Patient not taking: Reported on 07/27/2024), Disp: 90 tablet, Rfl: 1   tretinoin (RETIN-A) 0.05 % cream, Apply 1 Application topically at bedtime as needed. Apply pea sized amount to affected area  of face nightly as needed. (Patient not taking: Reported on 07/27/2024), Disp: , Rfl: 5  Allergies:  Allergies  Allergen Reactions   Sulfa Antibiotics Hives   Cephalexin  Rash    Face turned bright red.   Diazepam Other (See Comments)    Doesn't like the way it makes her feel.    Past Medical History, Surgical history, Social history, and Family History were reviewed and updated.  Review of Systems: Review of Systems  HENT:  Negative.    Eyes: Negative.   Respiratory:  Positive for shortness of breath.   Cardiovascular: Negative.   Gastrointestinal:  Positive for abdominal distention.  Endocrine: Negative.   Musculoskeletal:  Positive for arthralgias, flank pain, myalgias and neck stiffness.  Skin: Negative.   Neurological: Negative.    Hematological: Negative.   Psychiatric/Behavioral: Negative.      Physical Exam:  Vital signs are temperature 97.7.  Pulse 75.  Blood pressure 135/80.  Weight is 167 pounds.     Wt Readings from Last 3 Encounters:  07/27/24 167 lb 12.8 oz (76.1 kg)  07/02/24 168 lb 12.8 oz (76.6 kg)  05/04/24 166 lb 1.3 oz (75.3 kg)    Physical Exam Vitals reviewed.  HENT:     Head: Normocephalic and atraumatic.  Eyes:     Pupils: Pupils are equal, round, and reactive to light.  Cardiovascular:     Rate and Rhythm: Normal rate and regular rhythm.     Heart sounds: Normal heart sounds.  Pulmonary:     Effort: Pulmonary effort is normal.     Breath sounds: Normal breath sounds.  Abdominal:     General: Bowel sounds are normal.     Palpations: Abdomen is soft.  Musculoskeletal:        General: No tenderness or deformity. Normal range of motion.     Cervical back: Normal range of motion.     Comments: She has a splint on the right forearm.  There is no swelling.  She has some limited range of motion of the thumb.  Lymphadenopathy:     Cervical: No cervical adenopathy.  Skin:    General: Skin is warm and dry.     Findings: No erythema or rash.  Neurological:     Mental Status: She is alert and oriented to person, place, and time.  Psychiatric:        Behavior: Behavior normal.        Thought Content: Thought content normal.        Judgment: Judgment normal.      Lab Results  Component Value Date   WBC 4.7 07/27/2024   HGB 15.3 (H) 07/27/2024   HCT 47.8 (H) 07/27/2024   MCV 83.7 07/27/2024   PLT 250 07/27/2024     Chemistry      Component Value Date/Time   NA 144 05/04/2024 1227   NA 141 02/27/2024 1443   K 4.3 05/04/2024 1227   CL 108 05/04/2024 1227   CO2 25 05/04/2024 1227   BUN 19 05/04/2024 1227   BUN 20 02/27/2024 1443   CREATININE 0.72 05/04/2024 1227   CREATININE 0.79 03/10/2014 0836      Component Value Date/Time   CALCIUM 9.1 05/04/2024 1227   ALKPHOS 84  05/04/2024 1227   AST 23 05/04/2024 1227   ALT 21 05/04/2024 1227   BILITOT 0.8 05/04/2024 1227       Impression and Plan: Heather Brown is a 78 year old white female.  She has polycythemia vera.  So  far, she is done very nicely.  I do believe that we will give her the iron, this really got her blood count up quite a bit.  She does feel somewhat tired.  This might be from the higher hemoglobin.  We will go ahead and phlebotomize her today.  I think this would certainly be quite helpful.  We will go ahead and plan to get her back to see us  in about 6 weeks now.    Heather JONELLE Crease, MD 12/29/202511:02 AM "

## 2024-08-04 ENCOUNTER — Ambulatory Visit: Admitting: Hematology & Oncology

## 2024-08-04 ENCOUNTER — Inpatient Hospital Stay

## 2024-08-06 ENCOUNTER — Other Ambulatory Visit: Payer: Self-pay | Admitting: Family Medicine

## 2024-08-06 DIAGNOSIS — M1811 Unilateral primary osteoarthritis of first carpometacarpal joint, right hand: Secondary | ICD-10-CM

## 2024-08-08 ENCOUNTER — Other Ambulatory Visit: Payer: Self-pay | Admitting: Nurse Practitioner

## 2024-08-08 DIAGNOSIS — K219 Gastro-esophageal reflux disease without esophagitis: Secondary | ICD-10-CM

## 2024-08-21 ENCOUNTER — Telehealth: Payer: Self-pay | Admitting: *Deleted

## 2024-08-21 DIAGNOSIS — K862 Cyst of pancreas: Secondary | ICD-10-CM

## 2024-08-21 NOTE — Telephone Encounter (Signed)
-----   Message from Nurse Rock DEL, RN sent at 02/18/2024  8:39 AM EDT ----- Regarding: MR/MRCP/Lab Pt needs mr/mrcp and ca 19-9, orders in epic.

## 2024-08-24 NOTE — Telephone Encounter (Signed)
 Patient has been scheduled for MRCP at [facility] on on 09/09/24 at 10 am. NPO 4 hours.  CA-19-9 ordered.  Left message for patient to call back.

## 2024-08-25 NOTE — Telephone Encounter (Signed)
 Left message to call back

## 2024-08-25 NOTE — Telephone Encounter (Signed)
 Inbound call from patient requesting to speak to Dottie in regards to her phone call yesterday. Patient is requesting to go over information for her MRCP. Please advise.

## 2024-08-26 NOTE — Telephone Encounter (Signed)
 Inbound call from patient requesting to speak to the nurse Dottie. Patient is requesting a call back. Please advise.

## 2024-08-26 NOTE — Telephone Encounter (Signed)
 Spoke to patient to advise of MRCP time/date/location/prep as well as need for CA19-9 testing at [facility] lab over the next 1-2 weeks. Patient verbalizes understanding and is in agreement with these tests.

## 2024-08-26 NOTE — Telephone Encounter (Signed)
 Left message to call back

## 2024-09-01 ENCOUNTER — Encounter: Payer: Self-pay | Admitting: Nurse Practitioner

## 2024-09-02 ENCOUNTER — Other Ambulatory Visit: Payer: Self-pay | Admitting: Family Medicine

## 2024-09-02 DIAGNOSIS — M1811 Unilateral primary osteoarthritis of first carpometacarpal joint, right hand: Secondary | ICD-10-CM

## 2024-09-04 ENCOUNTER — Telehealth: Payer: Self-pay | Admitting: Gastroenterology

## 2024-09-04 NOTE — Telephone Encounter (Signed)
 Inbound call from patient requesting to speak to Dottie in regards to her MRI appointment. Patient is requesting a call back today this afternoon. I did advise patient that messages due take up to 24-48 hours. Patient insisted for me to still let Dottie know to call her back today. Please advise.

## 2024-09-07 ENCOUNTER — Inpatient Hospital Stay

## 2024-09-07 ENCOUNTER — Inpatient Hospital Stay: Admitting: Hematology & Oncology

## 2024-09-08 ENCOUNTER — Ambulatory Visit: Admitting: Nurse Practitioner

## 2024-09-09 ENCOUNTER — Ambulatory Visit (HOSPITAL_COMMUNITY)

## 2024-12-22 ENCOUNTER — Ambulatory Visit: Payer: Self-pay

## 2025-03-16 ENCOUNTER — Ambulatory Visit: Payer: Self-pay | Admitting: Nurse Practitioner
